# Patient Record
Sex: Male | Born: 1945
Health system: Southern US, Community
[De-identification: ages and names within clinical notes are randomized; demographics above are authoritative.]

## PROBLEM LIST (undated history)

## (undated) DIAGNOSIS — Z973 Presence of spectacles and contact lenses: Secondary | ICD-10-CM

## (undated) DIAGNOSIS — N529 Male erectile dysfunction, unspecified: Secondary | ICD-10-CM

## (undated) DIAGNOSIS — C61 Malignant neoplasm of prostate: Secondary | ICD-10-CM

## (undated) DIAGNOSIS — F524 Premature ejaculation: Secondary | ICD-10-CM

## (undated) DIAGNOSIS — M75101 Unspecified rotator cuff tear or rupture of right shoulder, not specified as traumatic: Secondary | ICD-10-CM

## (undated) DIAGNOSIS — I10 Essential (primary) hypertension: Secondary | ICD-10-CM

## (undated) DIAGNOSIS — M179 Osteoarthritis of knee, unspecified: Secondary | ICD-10-CM

## (undated) DIAGNOSIS — M1710 Unilateral primary osteoarthritis, unspecified knee: Secondary | ICD-10-CM

## (undated) DIAGNOSIS — S86019A Strain of unspecified Achilles tendon, initial encounter: Secondary | ICD-10-CM

## (undated) DIAGNOSIS — K4090 Unilateral inguinal hernia, without obstruction or gangrene, not specified as recurrent: Secondary | ICD-10-CM

## (undated) HISTORY — DX: Essential (primary) hypertension: I10

## (undated) HISTORY — DX: Unilateral inguinal hernia, without obstruction or gangrene, not specified as recurrent: K40.90

## (undated) HISTORY — PX: VASECTOMY: SHX75

## (undated) HISTORY — PX: OTHER SURGICAL HISTORY: SHX169

## (undated) HISTORY — DX: Male erectile dysfunction, unspecified: N52.9

## (undated) HISTORY — DX: Premature ejaculation: F52.4

## (undated) HISTORY — DX: Strain of unspecified achilles tendon, initial encounter: S86.019A

## (undated) HISTORY — PX: TONSILLECTOMY: SUR1361

## (undated) HISTORY — DX: Osteoarthritis of knee, unspecified: M17.9

## (undated) HISTORY — DX: Malignant neoplasm of prostate: C61

## (undated) HISTORY — DX: Unspecified rotator cuff tear or rupture of right shoulder, not specified as traumatic: M75.101

## (undated) HISTORY — PX: PROSTATE BIOPSY: SHX241

## (undated) HISTORY — DX: Unilateral primary osteoarthritis, unspecified knee: M17.10

## (undated) HISTORY — PX: ROTATOR CUFF REPAIR: SHX139

---

## 2006-10-21 ENCOUNTER — Encounter: Admission: RE | Admit: 2006-10-21 | Discharge: 2006-11-06 | Payer: Self-pay | Admitting: Family Medicine

## 2009-08-14 LAB — HM COLONOSCOPY

## 2010-02-25 HISTORY — PX: ROTATOR CUFF REPAIR: SHX139

## 2011-01-03 ENCOUNTER — Ambulatory Visit (INDEPENDENT_AMBULATORY_CARE_PROVIDER_SITE_OTHER): Payer: 59 | Admitting: Family Medicine

## 2011-01-03 ENCOUNTER — Encounter: Payer: Self-pay | Admitting: *Deleted

## 2011-01-03 ENCOUNTER — Other Ambulatory Visit: Payer: Self-pay | Admitting: Family Medicine

## 2011-01-03 ENCOUNTER — Encounter: Payer: Self-pay | Admitting: Family Medicine

## 2011-01-03 DIAGNOSIS — M109 Gout, unspecified: Secondary | ICD-10-CM | POA: Insufficient documentation

## 2011-01-03 DIAGNOSIS — N529 Male erectile dysfunction, unspecified: Secondary | ICD-10-CM

## 2011-01-03 DIAGNOSIS — I10 Essential (primary) hypertension: Secondary | ICD-10-CM | POA: Insufficient documentation

## 2011-01-03 DIAGNOSIS — Z0000 Encounter for general adult medical examination without abnormal findings: Secondary | ICD-10-CM

## 2011-01-03 DIAGNOSIS — Z23 Encounter for immunization: Secondary | ICD-10-CM

## 2011-01-03 DIAGNOSIS — Z79899 Other long term (current) drug therapy: Secondary | ICD-10-CM

## 2011-01-03 DIAGNOSIS — Z125 Encounter for screening for malignant neoplasm of prostate: Secondary | ICD-10-CM

## 2011-01-03 LAB — COMPREHENSIVE METABOLIC PANEL
ALT: 31 U/L (ref 0–53)
AST: 23 U/L (ref 0–37)
Albumin: 4.8 g/dL (ref 3.5–5.2)
Alkaline Phosphatase: 66 U/L (ref 39–117)
BUN: 18 mg/dL (ref 6–23)
CO2: 28 mEq/L (ref 19–32)
Calcium: 10.1 mg/dL (ref 8.4–10.5)
Chloride: 100 mEq/L (ref 96–112)
Creat: 0.85 mg/dL (ref 0.50–1.35)
Glucose, Bld: 99 mg/dL (ref 70–99)
Potassium: 3.9 mEq/L (ref 3.5–5.3)
Sodium: 139 mEq/L (ref 135–145)
Total Bilirubin: 0.6 mg/dL (ref 0.3–1.2)
Total Protein: 7.6 g/dL (ref 6.0–8.3)

## 2011-01-03 LAB — POCT URINALYSIS DIPSTICK
Bilirubin, UA: NEGATIVE
Blood, UA: NEGATIVE
Glucose, UA: NEGATIVE
Ketones, UA: NEGATIVE
Leukocytes, UA: NEGATIVE
Nitrite, UA: NEGATIVE
Protein, UA: NEGATIVE
Spec Grav, UA: 1.02
Urobilinogen, UA: NEGATIVE
pH, UA: 5

## 2011-01-03 LAB — LIPID PANEL
Cholesterol: 220 mg/dL — ABNORMAL HIGH (ref 0–200)
HDL: 52 mg/dL (ref >39)
LDL Cholesterol: 144 mg/dL — ABNORMAL HIGH (ref 0–99)
Total CHOL/HDL Ratio: 4.2 Ratio
Triglycerides: 119 mg/dL (ref <150)
VLDL: 24 mg/dL (ref 0–40)

## 2011-01-03 LAB — URIC ACID: Uric Acid, Serum: 6.3 mg/dL (ref 4.0–7.8)

## 2011-01-03 MED ORDER — TADALAFIL 5 MG PO TABS: 5.0000 mg | ORAL_TABLET | Freq: Every day | ORAL | Status: DC | PRN

## 2011-01-03 MED ORDER — HYDROCHLOROTHIAZIDE 25 MG PO TABS: 25.0000 mg | ORAL_TABLET | Freq: Every day | ORAL | Status: DC

## 2011-01-03 MED ORDER — ALLOPURINOL 300 MG PO TABS: 300.0000 mg | ORAL_TABLET | Freq: Every day | ORAL | Status: DC

## 2011-01-03 NOTE — Progress Notes (Signed)
Mark Caldwell is a 65 y.o. male who presents for a complete physical.  He has the following concerns: Needs medication refills. Gout--hasn't had any flares in years.  Has been on same combination of HCTZ and allopurinol without problems (used to have flares with long car rides when didn't drink enough water) HTN--Hasn't checked it recently.  When he was checking it regularly, was 130-135/80-85.     Immunization History  Administered Date(s) Administered  . Influenza Split 01/03/2011  . Tdap 02/22/2009  . Zoster 01/25/2006   Last colonoscopy: 2011, normal per patient Last PSA: 01/2010 3.18 Dentist: once yearly Ophtho: once yearly Exercise:  Admits he hasn't exercised since the Spring.  Prior to that did yoga, and was a little more active.  Past Medical History  Diagnosis Date  . Hemorrhoids   . Gout   . Right rotator cuff tear   . Hypertension     elevated BP.  Marland Kitchen Partial tear of Achilles tendon summer 2005    left  . Premature ejaculation   . ED (erectile dysfunction)     Past Surgical History  Procedure Date  . Pyloric stenosis surgery 90 weeks old.  . Tonsillectomy   . Vasectomy     History   Social History  . Marital Status: Married    Spouse Name: N/A    Number of Children: 2  . Years of Education: N/A   Occupational History  . leadership training    Social History Main Topics  . Smoking status: Former Smoker    Types: Pipe  . Smokeless tobacco: Never Used   Comment: quit smoking pipe in the 80's  . Alcohol Use: Yes     8-10 drinks per week.  . Drug Use: No  . Sexually Active: Not on file   Other Topics Concern  . Not on file   Social History Narrative   Lives with wife, stepson and dog.  Son died at 78.  78 year old stepson.  Daughter in [place], Son in [place].  Travels a lot with his job    Family History  Problem Relation Age of Onset  . Asthma Mother   . Osteoporosis Mother   . Leukemia Father   . Hyperlipidemia Father     increased chol  .  Coronary artery disease Father 19    CABG  . Cancer Father     leukemia  . Down syndrome Brother   . Deep vein thrombosis Brother 20  . Gout Brother   . Dementia Brother   . Hodgkin's lymphoma Son   . Gout Son   . Cancer Maternal Grandfather     lung (smoker)  . Diabetes Neg Hx     Current outpatient prescriptions:allopurinol (ZYLOPRIM) 300 MG tablet, Take 1 tablet (300 mg total) by mouth daily., Disp: 90 tablet, Rfl: 3;  hydrochlorothiazide (HYDRODIURIL) 25 MG tablet, Take 1 tablet (25 mg total) by mouth daily., Disp: 90 tablet, Rfl: 1;  tadalafil (CIALIS) 5 MG tablet, Take 1 tablet (5 mg total) by mouth daily as needed., Disp: 90 tablet, Rfl: 3 DISCONTD: allopurinol (ZYLOPRIM) 300 MG tablet, Take 300 mg by mouth daily.  , Disp: , Rfl: ;  DISCONTD: hydrochlorothiazide (HYDRODIURIL) 25 MG tablet, Take 25 mg by mouth daily.  , Disp: , Rfl: ;  DISCONTD: tadalafil (CIALIS) 5 MG tablet, Take 5 mg by mouth daily as needed.  , Disp: , Rfl:   No Known Allergies  ROS: The patient denies anorexia, fever, weight changes, headaches,  vision loss, decreased hearing, ear pain, hoarseness, chest pain, palpitations, dizziness, syncope, dyspnea on exertion, cough, swelling, nausea, vomiting, diarrhea, constipation, abdominal pain, melena, hematochezia, hematuria, incontinence, nocturia, weakened urine stream, dysuria, genital lesions, joint pains, numbness, tingling, weakness, tremor, suspicious skin lesions, depression, anxiety, abnormal bleeding/bruising, or enlarged lymph nodes +heartburn related to certain foods Premature ejaculation (and mild ED)--much improved on the daily Cialis  PHYSICAL EXAM: BP 130/96  Pulse 64  Ht 6' (1.829 m)  Wt 230 lb (104.327 kg)  BMI 31.19 kg/m2  General Appearance:    Alert, cooperative, no distress, appears stated age  Head:    Normocephalic, without obvious abnormality, atraumatic  Eyes:    PERRL, conjunctiva/corneas clear, EOM's intact, fundi    benign  Ears:     Normal TM's and external ear canals  Nose:   Nares normal, mucosa normal, no drainage or sinus   tenderness  Throat:   Lips, mucosa, and tongue normal; teeth and gums normal  Neck:   Supple, no lymphadenopathy;  thyroid:  no   enlargement/tenderness/nodules; no carotid   bruit or JVD  Back:    Spine nontender, no curvature, ROM normal, no CVA     tenderness  Lungs:     Clear to auscultation bilaterally without wheezes, rales or     ronchi; respirations unlabored  Chest Wall:    No tenderness or deformity   Heart:    Regular rate and rhythm, S1 and S2 normal, no murmur, rub   or gallop  Breast Exam:    No chest wall tenderness, masses or gynecomastia  Abdomen:     Soft, non-tender, nondistended, normoactive bowel sounds,    no masses, no hepatosplenomegaly. +abdominal obesity  Genitalia:    Normal male external genitalia without lesions.  Testicles without masses.  No inguinal hernias.  Rectal:    Normal sphincter tone, no masses or tenderness; guaiac negative stool.  Prostate smooth, no nodules, not enlarged.  Extremities:   No clubbing, cyanosis or edema  Pulses:   2+ and symmetric all extremities  Skin:   Skin color, texture, turgor normal, no rashes or lesions  Lymph nodes:   Cervical, supraclavicular, and axillary nodes normal  Neurologic:   CNII-XII intact, normal strength, sensation and gait; reflexes 2+ and symmetric throughout          Psych:   Normal mood, affect, hygiene and grooming.     ASSESSMENT/PLAN:  1. Routine general medical examination at a health care facility  POCT Urinalysis Dipstick, Visual acuity screening  2. Need for prophylactic vaccination and inoculation against influenza  Flu vaccine greater than or equal to 3yo preservative free IM  3. Gout  Uric acid, allopurinol (ZYLOPRIM) 300 MG tablet  4. Essential hypertension, benign  Lipid panel, Comprehensive metabolic panel, hydrochlorothiazide (HYDRODIURIL) 25 MG tablet  5. Encounter for long-term (current) use  of other medications  Comprehensive metabolic panel, Uric acid  6. Erectile dysfunction  Testosterone, free, total, tadalafil (CIALIS) 5 MG tablet  7. Special screening for malignant neoplasm of prostate  PSA   Discussed PSA screening (risks/benefits), recommended at least 30 minutes of aerobic activity at least 5 days/week; proper sunscreen use reviewed; healthy diet and alcohol recommendations (less than or equal to 2 drinks/day) reviewed; regular seatbelt use; changing batteries in smoke detectors. Self-testicular exams. Immunization recommendations discussed--flu shot given.  Colonoscopy recommendations reviewed--UTD.  HTN-- elevated today.  Discussed importance of need for daily exercise, weight loss.  Discussed portion control and healthy choices given his frequency  of eating out in restaurants.  Low sodium diet encouraged.  Start checking BP's elsewhere, and f/u if persistently >135/85.  Otherwise, f/u in 6 months with list of BP's.  Gout--ideally he shouldn't be on HCTZ, but has been on this current regimen and doing well, therefore will continue.  Discussed cutting back on alcohol (to reduce risk for gout, and to help with weight loss).

## 2011-01-03 NOTE — Patient Instructions (Addendum)
HEALTH MAINTENANCE RECOMMENDATIONS:  It is recommended that you get at least 30 minutes of aerobic exercise at least 5 days/week (for weight loss, you may need as much as 60-90 minutes). This can be any activity that gets your heart rate up. This can be divided in 10-15 minute intervals if needed, but try and build up your endurance at least once a week.  Weight bearing exercise is also recommended twice weekly.  Eat a healthy diet with lots of vegetables, fruits and fiber.  "Colorful" foods have a lot of vitamins (ie green vegetables, tomatoes, red peppers, etc).  Limit sweet tea, regular sodas and alcoholic beverages, all of which has a lot of calories and sugar.  Up to 2 alcoholic drinks daily may be beneficial for men (unless trying to lose weight, watch sugars).  Drink a lot of water.  Sunscreen of at least SPF 30 should be used on all sun-exposed parts of the skin when outside between the hours of 10 am and 4 pm (not just when at beach or pool, but even with exercise, golf, tennis, and yard work!)  Use a sunscreen that says "broad spectrum" so it covers both UVA and UVB rays, and make sure to reapply every 1-2 hours.  Remember to change the batteries in your smoke detectors when changing your clock times in the spring and fall.  Use your seat belt every time you are in a car, and please drive safely and not be distracted with cell phones and texting while driving.  Weight loss is recommended

## 2011-01-04 LAB — TESTOSTERONE, FREE, TOTAL, SHBG
Sex Hormone Binding: 36 nmol/L (ref 13–71)
Testosterone, Free: 82.1 pg/mL (ref 47.0–244.0)
Testosterone-% Free: 1.9 % (ref 1.6–2.9)
Testosterone: 422.76 ng/dL (ref 250–890)

## 2011-01-04 LAB — PSA: PSA: 4.33 ng/mL — ABNORMAL HIGH (ref <=4.00)

## 2011-01-10 ENCOUNTER — Telehealth: Payer: Self-pay | Admitting: *Deleted

## 2011-01-10 NOTE — Telephone Encounter (Signed)
Called patient and left him a message informing him that his appt with Dr.Dahlstedt @ [facility] 01/30/2011 @ 2pm. I will fax notes and labs to [phone]. Patient to call if he has any further questions.  FYI-They had originally offered me an appt with Dr.Dahlstedt(whom pt had seen before and they would not allow him to see another provider) for Jan 2013 and I sent a request to his nurse, so this appt in Dec is the absolute only appt they could possibly squeeze him in for.

## 2011-03-21 ENCOUNTER — Encounter: Payer: Self-pay | Admitting: *Deleted

## 2011-03-21 DIAGNOSIS — Z029 Encounter for administrative examinations, unspecified: Secondary | ICD-10-CM

## 2011-06-28 ENCOUNTER — Encounter: Payer: Self-pay | Admitting: Urology

## 2011-07-08 ENCOUNTER — Ambulatory Visit (INDEPENDENT_AMBULATORY_CARE_PROVIDER_SITE_OTHER): Payer: 59 | Admitting: Family Medicine

## 2011-07-08 ENCOUNTER — Encounter: Payer: Self-pay | Admitting: Family Medicine

## 2011-07-08 VITALS — BP 130/84 | HR 68 | Ht 72.0 in | Wt 222.0 lb

## 2011-07-08 DIAGNOSIS — I10 Essential (primary) hypertension: Secondary | ICD-10-CM

## 2011-07-08 DIAGNOSIS — Z79899 Other long term (current) drug therapy: Secondary | ICD-10-CM

## 2011-07-08 DIAGNOSIS — R5381 Other malaise: Secondary | ICD-10-CM

## 2011-07-08 DIAGNOSIS — M109 Gout, unspecified: Secondary | ICD-10-CM

## 2011-07-08 DIAGNOSIS — G4700 Insomnia, unspecified: Secondary | ICD-10-CM

## 2011-07-08 DIAGNOSIS — E7800 Pure hypercholesterolemia, unspecified: Secondary | ICD-10-CM

## 2011-07-08 DIAGNOSIS — R5383 Other fatigue: Secondary | ICD-10-CM

## 2011-07-08 DIAGNOSIS — N529 Male erectile dysfunction, unspecified: Secondary | ICD-10-CM

## 2011-07-08 MED ORDER — SILDENAFIL CITRATE 100 MG PO TABS: 50.0000 mg | ORAL_TABLET | Freq: Every day | ORAL | Status: DC | PRN

## 2011-07-08 MED ORDER — HYDROCHLOROTHIAZIDE 25 MG PO TABS: 25.0000 mg | ORAL_TABLET | Freq: Every day | ORAL | Status: DC

## 2011-07-08 MED ORDER — ZOLPIDEM TARTRATE 10 MG PO TABS: 10.0000 mg | ORAL_TABLET | Freq: Every evening | ORAL | Status: DC | PRN

## 2011-07-08 NOTE — Progress Notes (Signed)
Chief Complaint  Patient presents with  . Hypertension    6 month follow up. Also had a PSA biopsy-negative results, Dr.Dahlstedt. Also wants to discuss weight issues.    HPI:  Patient presents for 6 month follow up on hypertension.  BP's over the last month have been running low-mid 130's/low 80's.  Prior to this, BP's were a little higher, ranging 130-145/80-90.  Has lost 8 pounds since the last visit--does Weight Watchers when not traveling.  Exercising twice a week.  He is still traveling at least 3 days/week, and eats out in restaurants, doesn't exercise during travel times.  Review of his records of weight and BP show that he lost the weight down to 223 within the first 2 weeks after last visit, but has fluctuated between 220-223 since then.  Patient was wanting PSA repeated today, thinking that it should have gotten lower, and was surprised when it was still elevated through labs done for insurance (4.76).  He saw urologist in December, and had benign biopsy in January. He was told to f/u in 1 year.  He brings in labs from insurance, done in March 2013.  This showed NONfasting glu 134, A1c 5.5.  Lipids were improved--total 195, HDL 54.4, LDL 109.4, ratio 3.54, TG 161.  Uric acid 6.6, remainder of chem panel was also normal.  ED--he would like to go back to Viagra. He recalls trying it in the past and found it as effective  He will be traveling to [place], and is requesting Rx for Ambien #20, as he has used this in the past with travel to [place].  Past Medical History  Diagnosis Date  . Hemorrhoids   . Gout   . Right rotator cuff tear   . Hypertension     elevated BP.  Marland Kitchen Partial tear of Achilles tendon summer 2005    left  . Premature ejaculation   . ED (erectile dysfunction)     Past Surgical History  Procedure Date  . Pyloric stenosis surgery 35 weeks old.  . Tonsillectomy   . Vasectomy     History   Social History  . Marital Status: Married    Spouse Name: N/A   Number of Children: 2  . Years of Education: N/A   Occupational History  . leadership training    Social History Main Topics  . Smoking status: Former Smoker    Types: Pipe  . Smokeless tobacco: Never Used   Comment: quit smoking pipe in the 80's  . Alcohol Use: Yes     8-10 drinks per week.  . Drug Use: No  . Sexually Active: Not on file   Other Topics Concern  . Not on file   Social History Narrative   Lives with wife, stepson and dog.  Son died at 70.  42 year old stepson.  Daughter in [place], Son in [place].  Travels a lot with his job    Family History  Problem Relation Age of Onset  . Asthma Mother   . Osteoporosis Mother   . Leukemia Father   . Hyperlipidemia Father     increased chol  . Coronary artery disease Father 62    CABG  . Cancer Father     leukemia  . Down syndrome Brother   . Deep vein thrombosis Brother 20  . Gout Brother   . Dementia Brother   . Hodgkin's lymphoma Son   . Gout Son   . Cancer Maternal Grandfather     lung (smoker)  .  Diabetes Neg Hx    Current Outpatient Prescriptions on File Prior to Visit  Medication Sig Dispense Refill  . allopurinol (ZYLOPRIM) 300 MG tablet Take 1 tablet (300 mg total) by mouth daily.  90 tablet  3  . tadalafil (CIALIS) 5 MG tablet Take 1 tablet (5 mg total) by mouth daily as needed.  90 tablet  3  . DISCONTD: hydrochlorothiazide (HYDRODIURIL) 25 MG tablet Take 1 tablet (25 mg total) by mouth daily.  90 tablet  1  . sildenafil (VIAGRA) 100 MG tablet Take 0.5-1 tablets (50-100 mg total) by mouth daily as needed for erectile dysfunction.  10 tablet  11  . zolpidem (AMBIEN) 10 MG tablet Take 1 tablet (10 mg total) by mouth at bedtime as needed for sleep.  20 tablet  1    No Known Allergies  ROS:  Denies headaches, chest pain, palpitations, shortness of breath, dizziness, edema, GI complaints. Denies joint pains, gout.  Concerned about lesion on back--denies pain, but sometimes bothers him thinks he wants it  removed.  Denies any recent change  PHYSICAL EXAM: BP 130/84  Pulse 68  Ht 6' (1.829 m)  Wt 222 lb (100.699 kg)  BMI 30.11 kg/m2 Well developed, overweight, pleasant male in no distress Back: subcutaneous, nontender lesion measuring just under 2cm in height and width.  No central pore, but has consistency of sebaceous cyst (vs lipoma). No CVA tenderness or spine tenderness Neck: no lymphadenopathy or mass Heart: regular rate and rhythm without murmur Lungs: clear bilaterally Extremities: no edema Skin: no rashes/lesions Psych: normal mood, affect, hygiene and grooming  ASSESSMENT/PLAN: 1. Essential hypertension, benign  hydrochlorothiazide (HYDRODIURIL) 25 MG tablet, Comprehensive metabolic panel  2. ED (erectile dysfunction)  sildenafil (VIAGRA) 100 MG tablet  3. Insomnia  zolpidem (AMBIEN) 10 MG tablet  4. Pure hypercholesterolemia  Lipid panel  5. Other malaise and fatigue  CBC with Differential, TSH  6. Gout  Uric acid  7. Encounter for long-term (current) use of other medications  Comprehensive metabolic panel, Uric acid   6 months f/u--fasting labs prior FLP, c-met, uric acid, cbc, TSH  Urologist to follow the PSA. Reassured regarding the fact that PSA will likely remain elevated in the 4-5 range.  Recheck as scheduled with urologist.  Strongly encouraged more frequent exercise, portion control while traveling.  Weight loss encouraged.  Lipids improved from last check here, per insurance labs.  Continue low cholesterol diet and recheck in 6 months.  HTN--controlled, somewhat borderline.  Increased exercise, and further weight loss will help keep numbers within goal.  Gout--asymptomatic--prefers to remain on HCTZ and allopurinol rather than making any changes to the regimen.

## 2011-07-08 NOTE — Patient Instructions (Signed)
Try and get at least 30 minutes of exercise daily.  Continue the low cholesterol diet--your numbers were much improved. Try and watch your portions, as well as healthy food choices while out of town.  Continue weight loss efforts.

## 2011-12-03 ENCOUNTER — Telehealth: Payer: Self-pay | Admitting: Family Medicine

## 2011-12-03 DIAGNOSIS — N529 Male erectile dysfunction, unspecified: Secondary | ICD-10-CM

## 2011-12-03 MED ORDER — SILDENAFIL CITRATE 100 MG PO TABS: 50.0000 mg | ORAL_TABLET | Freq: Every day | ORAL | Status: DC | PRN

## 2011-12-03 NOTE — Telephone Encounter (Signed)
done

## 2011-12-30 ENCOUNTER — Other Ambulatory Visit: Payer: 59

## 2011-12-30 DIAGNOSIS — Z79899 Other long term (current) drug therapy: Secondary | ICD-10-CM

## 2011-12-30 DIAGNOSIS — I10 Essential (primary) hypertension: Secondary | ICD-10-CM

## 2011-12-30 DIAGNOSIS — R5381 Other malaise: Secondary | ICD-10-CM

## 2011-12-30 DIAGNOSIS — R5383 Other fatigue: Secondary | ICD-10-CM

## 2011-12-30 DIAGNOSIS — E7800 Pure hypercholesterolemia, unspecified: Secondary | ICD-10-CM

## 2011-12-31 LAB — LIPID PANEL
Cholesterol: 191 mg/dL (ref 0–200)
HDL: 56 mg/dL (ref >39)
LDL Cholesterol: 117 mg/dL — ABNORMAL HIGH (ref 0–99)
Total CHOL/HDL Ratio: 3.4 Ratio
Triglycerides: 89 mg/dL (ref <150)
VLDL: 18 mg/dL (ref 0–40)

## 2011-12-31 LAB — CBC WITH DIFFERENTIAL/PLATELET
Basophils Absolute: 0.1 10*3/uL (ref 0.0–0.1)
Basophils Relative: 1 % (ref 0–1)
Eosinophils Absolute: 0.2 10*3/uL (ref 0.0–0.7)
Eosinophils Relative: 3 % (ref 0–5)
HCT: 45.3 % (ref 39.0–52.0)
Hemoglobin: 15.3 g/dL (ref 13.0–17.0)
Lymphocytes Relative: 30 % (ref 12–46)
Lymphs Abs: 2.1 10*3/uL (ref 0.7–4.0)
MCH: 30.3 pg (ref 26.0–34.0)
MCHC: 33.8 g/dL (ref 30.0–36.0)
MCV: 89.7 fL (ref 78.0–100.0)
Monocytes Absolute: 0.5 10*3/uL (ref 0.1–1.0)
Monocytes Relative: 7 % (ref 3–12)
Neutro Abs: 4.1 10*3/uL (ref 1.7–7.7)
Neutrophils Relative %: 59 % (ref 43–77)
Platelets: 246 10*3/uL (ref 150–400)
RBC: 5.05 MIL/uL (ref 4.22–5.81)
RDW: 14.7 % (ref 11.5–15.5)
WBC: 6.9 10*3/uL (ref 4.0–10.5)

## 2011-12-31 LAB — COMPREHENSIVE METABOLIC PANEL
ALT: 22 U/L (ref 0–53)
AST: 19 U/L (ref 0–37)
Albumin: 4.5 g/dL (ref 3.5–5.2)
Alkaline Phosphatase: 62 U/L (ref 39–117)
BUN: 24 mg/dL — ABNORMAL HIGH (ref 6–23)
CO2: 26 mEq/L (ref 19–32)
Calcium: 9.7 mg/dL (ref 8.4–10.5)
Chloride: 101 mEq/L (ref 96–112)
Creat: 0.84 mg/dL (ref 0.50–1.35)
Glucose, Bld: 89 mg/dL (ref 70–99)
Potassium: 4.6 mEq/L (ref 3.5–5.3)
Sodium: 138 mEq/L (ref 135–145)
Total Bilirubin: 0.8 mg/dL (ref 0.3–1.2)
Total Protein: 6.9 g/dL (ref 6.0–8.3)

## 2011-12-31 LAB — TSH: TSH: 1.25 u[IU]/mL (ref 0.350–4.500)

## 2011-12-31 LAB — URIC ACID: Uric Acid, Serum: 7.5 mg/dL (ref 4.0–7.8)

## 2012-01-06 ENCOUNTER — Ambulatory Visit (INDEPENDENT_AMBULATORY_CARE_PROVIDER_SITE_OTHER): Payer: 59 | Admitting: Family Medicine

## 2012-01-06 ENCOUNTER — Encounter: Payer: Self-pay | Admitting: Family Medicine

## 2012-01-06 VITALS — BP 140/90 | HR 80 | Ht 72.0 in | Wt 227.0 lb

## 2012-01-06 DIAGNOSIS — L909 Atrophic disorder of skin, unspecified: Secondary | ICD-10-CM

## 2012-01-06 DIAGNOSIS — I10 Essential (primary) hypertension: Secondary | ICD-10-CM

## 2012-01-06 DIAGNOSIS — L918 Other hypertrophic disorders of the skin: Secondary | ICD-10-CM

## 2012-01-06 DIAGNOSIS — L919 Hypertrophic disorder of the skin, unspecified: Secondary | ICD-10-CM

## 2012-01-06 DIAGNOSIS — M109 Gout, unspecified: Secondary | ICD-10-CM

## 2012-01-06 DIAGNOSIS — Z23 Encounter for immunization: Secondary | ICD-10-CM

## 2012-01-06 DIAGNOSIS — R972 Elevated prostate specific antigen [PSA]: Secondary | ICD-10-CM

## 2012-01-06 DIAGNOSIS — Z125 Encounter for screening for malignant neoplasm of prostate: Secondary | ICD-10-CM

## 2012-01-06 DIAGNOSIS — R5383 Other fatigue: Secondary | ICD-10-CM

## 2012-01-06 DIAGNOSIS — R5381 Other malaise: Secondary | ICD-10-CM

## 2012-01-06 DIAGNOSIS — N529 Male erectile dysfunction, unspecified: Secondary | ICD-10-CM

## 2012-01-06 DIAGNOSIS — Z0000 Encounter for general adult medical examination without abnormal findings: Secondary | ICD-10-CM

## 2012-01-06 LAB — PSA, TOTAL AND FREE
PSA, Free Pct: 16 % — ABNORMAL LOW (ref >25)
PSA, Free: 0.83 ng/mL
PSA: 5.31 ng/mL — ABNORMAL HIGH (ref <=4.00)

## 2012-01-06 MED ORDER — SILDENAFIL CITRATE 100 MG PO TABS: 50.0000 mg | ORAL_TABLET | Freq: Every day | ORAL | Status: DC | PRN

## 2012-01-06 MED ORDER — ALLOPURINOL 300 MG PO TABS: 300.0000 mg | ORAL_TABLET | Freq: Every day | ORAL | Status: DC

## 2012-01-06 MED ORDER — LISINOPRIL-HYDROCHLOROTHIAZIDE 20-25 MG PO TABS: 1.0000 | ORAL_TABLET | Freq: Every day | ORAL | Status: DC

## 2012-01-06 NOTE — Progress Notes (Signed)
Chief Complaint  Patient presents with  . Annual Exam    annual CPE, needs form filled out. Labs done 12/30/11.   Mark Caldwell is a 66 y.o. male who presents for a complete physical.  He has the following concerns: He is here for med check and needs refills.. Gout--no flares in the last year. Compliant with his medication. HTN--121-151/72-91.  Averaging 140/80's, mostly 135-150.  Denies headaches, dizziness, edema, chest pain. ED-- using Viagra prn with good results. Skin tags on L neck he wants frozen again Freckle on chest his wife is concerned about.  Denies change in size, color, or any bleeding.  Health Maintenance: Immunization History  Administered Date(s) Administered  . Hepatitis A 01/27/2006  . Influenza Split 01/10/2009, 01/03/2011  . Tdap 02/22/2009  . Zoster 01/25/2006   Last colonoscopy: 2011, normal per patient Last PSA: 1 year ago, due to see Dr. Retta Diones again. Had biopsy last year, benign. Dentist: yearly Ophtho: yearly Exercise: 30 minutes twice a week.  Walks a lot with his traveling.  Past Medical History  Diagnosis Date  . Hemorrhoids   . Gout   . Right rotator cuff tear   . Hypertension     elevated BP.  Marland Kitchen Partial tear of Achilles tendon summer 2005    left  . Premature ejaculation   . ED (erectile dysfunction)     Past Surgical History  Procedure Date  . Pyloric stenosis surgery 36 weeks old.  . Tonsillectomy   . Vasectomy   . Rotator cuff repair     right; Dr. Jerl Santos    History   Social History  . Marital Status: Married    Spouse Name: N/A    Number of Children: 2  . Years of Education: N/A   Occupational History  . leadership training    Social History Main Topics  . Smoking status: Former Smoker    Types: Pipe  . Smokeless tobacco: Never Used     Comment: quit smoking pipe in the 80's  . Alcohol Use: Yes     Comment: 8-10 drinks per week.  . Drug Use: No  . Sexually Active: Yes -- Male partner(s)   Other Topics  Concern  . Not on file   Social History Narrative   Lives with wife, stepson and dog.  Son died at 103.  48 year old stepson.  Daughter in [place], Son in [place].  Travels a lot with his job    Family History  Problem Relation Age of Onset  . Asthma Mother   . Osteoporosis Mother   . Leukemia Father   . Hyperlipidemia Father     increased chol  . Coronary artery disease Father 68    CABG  . Cancer Father     leukemia  . Down syndrome Brother   . Deep vein thrombosis Brother 20  . Gout Brother   . Dementia Brother   . Hodgkin's lymphoma Son   . Gout Son   . Cancer Maternal Grandfather     lung (smoker)  . Diabetes Neg Hx    Current Outpatient Prescriptions on File Prior to Visit  Medication Sig Dispense Refill  . [DISCONTINUED] allopurinol (ZYLOPRIM) 300 MG tablet Take 1 tablet (300 mg total) by mouth daily.  90 tablet  3  . [DISCONTINUED] sildenafil (VIAGRA) 100 MG tablet Take 0.5-1 tablets (50-100 mg total) by mouth daily as needed for erectile dysfunction.  10 tablet  11  . lisinopril-hydrochlorothiazide (PRINZIDE,ZESTORETIC) 20-25 MG per tablet Take 1  tablet by mouth daily.  90 tablet  0  . zolpidem (AMBIEN) 10 MG tablet Take 1 tablet (10 mg total) by mouth at bedtime as needed for sleep.  20 tablet  1   (prior to visit, just on HCTZ, changed to lisinopril HCT today)  No Known Allergies  ROS: The patient denies anorexia, fever, headaches, vision loss, decreased hearing, ear pain, hoarseness, chest pain, palpitations, dizziness, syncope, dyspnea on exertion, cough, swelling, nausea, vomiting, diarrhea, constipation, abdominal pain, melena, hematochezia, hematuria, incontinence, nocturia, weakened urine stream, dysuria, genital lesions, joint pains, numbness, tingling, weakness, tremor, depression, anxiety, abnormal bleeding/bruising, or enlarged lymph nodes  Premature ejaculation (and mild ED)-improved/controlled with Viagra.  Skin concerns per HPI.  PHYSICAL EXAM: BP 140/90   Pulse 80  Ht 6' (1.829 m)  Wt 227 lb (102.967 kg)  BMI 30.79 kg/m2  General Appearance:  Alert, cooperative, no distress, appears stated age   Head:  Normocephalic, without obvious abnormality, atraumatic   Eyes:  PERRL, conjunctiva/corneas clear, EOM's intact, fundi  benign   Ears:  Normal TM's and external ear canals   Nose:  Nares normal, mucosa normal, no drainage or sinus tenderness   Throat:  Lips, mucosa, and tongue normal; teeth and gums normal   Neck:  Supple, no lymphadenopathy; thyroid: no enlargement/tenderness/nodules; no carotid  bruit or JVD   Back:  Spine nontender, no curvature, ROM normal, no CVA tenderness   Lungs:  Clear to auscultation bilaterally without wheezes, rales or ronchi; respirations unlabored   Chest Wall:  No tenderness or deformity   Heart:  Regular rate and rhythm, S1 and S2 normal, no murmur, rub  or gallop   Breast Exam:  No chest wall tenderness, masses or gynecomastia   Abdomen:  Soft, non-tender, nondistended, normoactive bowel sounds,  no masses, no hepatosplenomegaly. +abdominal obesity   Genitalia:  Deferred to urology  Rectal:  Deferred to urology   Extremities:  No clubbing, cyanosis or edema   Pulses:  2+ and symmetric all extremities   Skin:  Skin color, texture, turgor normal, no rashes or lesions. Skin tag (small) L neck. Nontender.  Angiomas on chest/abdomen.  The one his wife is concerned about is purple (rather than red)--vascular, 1mm, round, uniform  Lymph nodes:  Cervical, supraclavicular, and axillary nodes normal   Neurologic:  CNII-XII intact, normal strength, sensation and gait; reflexes 2+ and symmetric throughout   Psych: Normal mood, affect, hygiene and grooming.   Lab Results  Component Value Date   WBC 6.9 12/30/2011   HGB 15.3 12/30/2011   HCT 45.3 12/30/2011   MCV 89.7 12/30/2011   PLT 246 12/30/2011   Lab Results  Component Value Date   CHOL 191 12/30/2011   CHOL 220* 01/03/2011   Lab Results  Component Value  Date   HDL 56 12/30/2011   HDL 52 42/06/9561   Lab Results  Component Value Date   LDLCALC 117* 12/30/2011   LDLCALC 144* 01/03/2011   Lab Results  Component Value Date   TRIG 89 12/30/2011   TRIG 119 01/03/2011   Lab Results  Component Value Date   CHOLHDL 3.4 12/30/2011   CHOLHDL 4.2 01/03/2011   No results found for this basename: LDLDIRECT     Chemistry      Component Value Date/Time   NA 138 12/30/2011 0841   K 4.6 12/30/2011 0841   CL 101 12/30/2011 0841   CO2 26 12/30/2011 0841   BUN 24* 12/30/2011 0841   CREATININE 0.84 12/30/2011 0841  Component Value Date/Time   CALCIUM 9.7 12/30/2011 0841   ALKPHOS 62 12/30/2011 0841   AST 19 12/30/2011 0841   ALT 22 12/30/2011 0841   BILITOT 0.8 12/30/2011 0841     Glucose 89 Lab Results  Component Value Date   TSH 1.250 12/30/2011   Uric acid 7.5   ASSESSMENT/PLAN: 1. Routine general medical examination at a health care facility    2. Gout  allopurinol (ZYLOPRIM) 300 MG tablet  3. Essential hypertension, benign  lisinopril-hydrochlorothiazide (PRINZIDE,ZESTORETIC) 20-25 MG per tablet   suboptimally controlled on HCTZ  4. ED (erectile dysfunction)  sildenafil (VIAGRA) 100 MG tablet, Testosterone  5. Need for prophylactic vaccination and inoculation against influenza  Flu vaccine greater than or equal to 3yo preservative free IM  6. Special screening for malignant neoplasm of prostate  PSA (Reflex To Free) (Serial)  7. Abnormal PSA  PSA (Reflex To Free) (Serial), PSA, total and free   repeat today, per pt request, and forward results to Dr. Retta Diones.  He will call to schedule f/u appt.  8. Fatigue  Testosterone  9. Skin tag  PR DESTRUC BENIGN/PREMAL,FIRST LESION   HTN--change to Lisinopril HCT 20-25.  Discussed the need for daily exercise, weight loss, low sodium diet. Monitor BP and keep record.  Reviewed risks/side effects of meds Return 3-6 weeks with list of BP's and expect labs (b-met at visit).  H/o abnormal PSA, with  benign biopsy.  Due for f/u with urology.  Labs done today, per pt request, and will forward to Dr. Retta Diones.  He is also requesting to have testosterone checked again (normal last year).  Gout--uric acid higher than at last check, but remains asymptomatic on his current regimen.  Continue allopurinol.  Briefly reviewed low purine diet.  Recommended decreasing alcohol intake.  Skin tag--treated with cryotherapy in freeze-thaw-freeze technique.  Discussed wound care.  Pt requested treatment of this lesions (aware that it is benign--gets irritated by his briefcase).  Reviewed ABCD's of melanoma.  Lesions on chest appears vascular, reassured, continued monitoring.

## 2012-01-06 NOTE — Patient Instructions (Signed)
HEALTH MAINTENANCE RECOMMENDATIONS:  It is recommended that you get at least 30 minutes of aerobic exercise at least 5 days/week (for weight loss, you may need as much as 60-90 minutes). This can be any activity that gets your heart rate up. This can be divided in 10-15 minute intervals if needed, but try and build up your endurance at least once a week.  Weight bearing exercise is also recommended twice weekly.  Eat a healthy diet with lots of vegetables, fruits and fiber.  "Colorful" foods have a lot of vitamins (ie green vegetables, tomatoes, red peppers, etc).  Limit sweet tea, regular sodas and alcoholic beverages, all of which has a lot of calories and sugar.  Up to 2 alcoholic drinks daily may be beneficial for men (unless trying to lose weight, watch sugars).  Drink a lot of water.  Sunscreen of at least SPF 30 should be used on all sun-exposed parts of the skin when outside between the hours of 10 am and 4 pm (not just when at beach or pool, but even with exercise, golf, tennis, and yard work!)  Use a sunscreen that says "broad spectrum" so it covers both UVA and UVB rays, and make sure to reapply every 1-2 hours.  Remember to change the batteries in your smoke detectors when changing your clock times in the spring and fall.  Use your seat belt every time you are in a car, and please drive safely and not be distracted with cell phones and texting while driving.   Lab Results  Component Value Date   WBC 6.9 12/30/2011   HGB 15.3 12/30/2011   HCT 45.3 12/30/2011   MCV 89.7 12/30/2011   PLT 246 12/30/2011   Lab Results  Component Value Date   CHOL 191 12/30/2011   CHOL 220* 01/03/2011   Lab Results  Component Value Date   HDL 56 12/30/2011   HDL 52 16/02/958   Lab Results  Component Value Date   LDLCALC 117* 12/30/2011   LDLCALC 144* 01/03/2011   Lab Results  Component Value Date   TRIG 89 12/30/2011   TRIG 119 01/03/2011   Lab Results  Component Value Date   CHOLHDL 3.4  12/30/2011   CHOLHDL 4.2 01/03/2011   No results found for this basename: LDLDIRECT     Chemistry      Component Value Date/Time   NA 138 12/30/2011 0841   K 4.6 12/30/2011 0841   CL 101 12/30/2011 0841   CO2 26 12/30/2011 0841   BUN 24* 12/30/2011 0841   CREATININE 0.84 12/30/2011 0841      Component Value Date/Time   CALCIUM 9.7 12/30/2011 0841   ALKPHOS 62 12/30/2011 0841   AST 19 12/30/2011 0841   ALT 22 12/30/2011 0841   BILITOT 0.8 12/30/2011 0841     Glucose 89 Lab Results  Component Value Date   TSH 1.250 12/30/2011   Uric acid 7.5

## 2012-01-07 LAB — TESTOSTERONE: Testosterone: 422.56 ng/dL (ref 300–890)

## 2012-02-03 ENCOUNTER — Ambulatory Visit (INDEPENDENT_AMBULATORY_CARE_PROVIDER_SITE_OTHER): Payer: 59 | Admitting: Family Medicine

## 2012-02-03 ENCOUNTER — Encounter: Payer: Self-pay | Admitting: Family Medicine

## 2012-02-03 VITALS — BP 142/82 | HR 68 | Ht 72.0 in | Wt 228.0 lb

## 2012-02-03 DIAGNOSIS — Z79899 Other long term (current) drug therapy: Secondary | ICD-10-CM

## 2012-02-03 DIAGNOSIS — I10 Essential (primary) hypertension: Secondary | ICD-10-CM

## 2012-02-03 LAB — BASIC METABOLIC PANEL
BUN: 20 mg/dL (ref 6–23)
CO2: 25 mEq/L (ref 19–32)
Calcium: 10 mg/dL (ref 8.4–10.5)
Chloride: 98 mEq/L (ref 96–112)
Creat: 0.89 mg/dL (ref 0.50–1.35)
Glucose, Bld: 101 mg/dL — ABNORMAL HIGH (ref 70–99)
Potassium: 4.3 mEq/L (ref 3.5–5.3)
Sodium: 133 mEq/L — ABNORMAL LOW (ref 135–145)

## 2012-02-03 NOTE — Progress Notes (Signed)
Chief Complaint  Patient presents with  . Hypertension    follow up on hypertension. Has biopsy scheduled Jan 6th with urologist.    BP med was changed from HCTZ to lisinopril HCT at last visit, due to BP's being above goal.  Denies any cough or side effects.  BP's are improved, but still not perfect, which concerns him. Ranging from 115-152/68-84.  Many in the 115-135 range, just a few higher, in just a few days (multiple checks on same day that were high).  Has been exercising more.  Continues to travel a lot. No changes in his weight.  Saw Dr. Retta Diones and is set to have a prostate ultrasound and biopsy in January.  Past Medical History  Diagnosis Date  . Hemorrhoids   . Gout   . Right rotator cuff tear   . Hypertension     elevated BP.  Marland Kitchen Partial tear of Achilles tendon summer 2005    left  . Premature ejaculation   . ED (erectile dysfunction)    Past Surgical History  Procedure Date  . Pyloric stenosis surgery 85 weeks old.  . Tonsillectomy   . Vasectomy   . Rotator cuff repair     right; Dr. Jerl Santos   History   Social History  . Marital Status: Married    Spouse Name: N/A    Number of Children: 2  . Years of Education: N/A   Occupational History  . leadership training    Social History Main Topics  . Smoking status: Former Smoker    Types: Pipe  . Smokeless tobacco: Never Used     Comment: quit smoking pipe in the 80's  . Alcohol Use: Yes     Comment: 8-10 drinks per week.  . Drug Use: No  . Sexually Active: Yes -- Male partner(s)   Other Topics Concern  . Not on file   Social History Narrative   Lives with wife, stepson and dog.  Son died at 21.  44 year old stepson.  Daughter in [place], Son in [place].  Travels a lot with his job   ROS:  Denies fevers, headaches, dizziness, chest pain, edema, muscle cramps.  Denies urinary complaints, joint pains, gout. Moods good  PHYSICAL EXAM: BP 150/86  Pulse 68  Ht 6' (1.829 m)  Wt 228 lb (103.42 kg)  BMI  30.92 kg/m2  142/82 on repeat by MD, RA Heart: regular rate and rhythm Lungs: clear bilaterally Extremities: no edema Psych: normal mood, affect  ASSESSMENT/PLAN: 1. Essential hypertension, benign  Basic metabolic panel  2. Encounter for long-term (current) use of other medications  Basic metabolic panel   HTN--improved control.  Not "perfect" but improved.  Continue current medications, but continue to work on behavioral measures--exercise, weight loss, low sodium diet.  Continue to periodically check BP's. F/u 6 months, sooner if BP's high (>140/90)

## 2012-02-03 NOTE — Patient Instructions (Addendum)
Continue current meds.  Have pharmacy contact us for refills when needed. Return sooner than 6 months if BP's are consistently running >140/90  Continue low sodium diet, daily exercise, and attempts at weight loss (just try NOT to gain over the holidays, worry about loss later).

## 2012-03-06 ENCOUNTER — Encounter: Payer: Self-pay | Admitting: Family Medicine

## 2012-03-27 ENCOUNTER — Other Ambulatory Visit: Payer: Self-pay | Admitting: Family Medicine

## 2012-04-13 ENCOUNTER — Telehealth: Payer: Self-pay | Admitting: *Deleted

## 2012-04-13 NOTE — Telephone Encounter (Signed)
noted 

## 2012-04-13 NOTE — Telephone Encounter (Signed)
Patient called to give you a report after seeing Dr.Dahlstedt [id number] cancer. Short term recommendation is to do active surveillance, PSA and rectal exam every 4 months and biopsy in 18-24 months. He also sent off a biopsy for specialized analysis of his Gleason score which was 6-to see if prostate cancer cells are at the high end of 6 or the low end. He thanked you for your concern and also wanted you to know that he felt that Dr.Dahlstedt was particularly helpful as the office was closed and he still saw him anyway.

## 2012-04-30 ENCOUNTER — Encounter: Payer: Self-pay | Admitting: Family Medicine

## 2012-06-11 ENCOUNTER — Telehealth: Payer: Self-pay | Admitting: Internal Medicine

## 2012-06-11 DIAGNOSIS — Z0289 Encounter for other administrative examinations: Secondary | ICD-10-CM

## 2012-06-11 NOTE — Telephone Encounter (Signed)
Faxed medical records to [facility] @ [phone]

## 2012-08-10 ENCOUNTER — Encounter: Payer: 59 | Admitting: Family Medicine

## 2012-08-21 ENCOUNTER — Other Ambulatory Visit: Payer: Self-pay | Admitting: Family Medicine

## 2012-10-12 ENCOUNTER — Encounter: Payer: Self-pay | Admitting: Family Medicine

## 2012-10-12 ENCOUNTER — Ambulatory Visit (INDEPENDENT_AMBULATORY_CARE_PROVIDER_SITE_OTHER): Payer: 59 | Admitting: Family Medicine

## 2012-10-12 VITALS — BP 130/88 | HR 64 | Ht 72.5 in | Wt 226.0 lb

## 2012-10-12 DIAGNOSIS — E7800 Pure hypercholesterolemia, unspecified: Secondary | ICD-10-CM

## 2012-10-12 DIAGNOSIS — C61 Malignant neoplasm of prostate: Secondary | ICD-10-CM | POA: Insufficient documentation

## 2012-10-12 DIAGNOSIS — Z79899 Other long term (current) drug therapy: Secondary | ICD-10-CM

## 2012-10-12 DIAGNOSIS — M109 Gout, unspecified: Secondary | ICD-10-CM

## 2012-10-12 DIAGNOSIS — I10 Essential (primary) hypertension: Secondary | ICD-10-CM

## 2012-10-12 NOTE — Progress Notes (Signed)
Chief Complaint  Patient presents with  . Hypertension    nonfasting med check, did bring bp log.   Hypertension follow-up:  Blood pressures elsewhere are 107-142/59-86, mostly running 116-135/70's-80. They are overall improved from his last visit.  He denies dizziness, headaches, chest pain.  Denies side effects of medications.  Goes to [place] every week.  He "eats and plays well", but exercise is limited other than walking a lot in [place] and being "active" at home. He carries a heavy briefcase and does a lot of walking but no other regular exercise.  He recently underwent repeat prostate biopsies.  It again only showed 1 foci with cancer out of many biopsies.  Active surveillance at this time for treatment, under the care of Dr. Retta Diones.  Past Medical History  Diagnosis Date  . Hemorrhoids   . Gout   . Right rotator cuff tear   . Hypertension     elevated BP.  Marland Kitchen Partial tear of Achilles tendon summer 2005    left  . Premature ejaculation   . ED (erectile dysfunction)   . Prostate cancer     surveillance; Dr. Retta Diones   Past Surgical History  Procedure Laterality Date  . Pyloric stenosis surgery  80 weeks old.  . Tonsillectomy    . Vasectomy    . Rotator cuff repair      right; Dr. Jerl Santos   History   Social History  . Marital Status: Married    Spouse Name: N/A    Number of Children: 2  . Years of Education: N/A   Occupational History  . leadership training    Social History Main Topics  . Smoking status: Former Smoker    Types: Pipe  . Smokeless tobacco: Never Used     Comment: quit smoking pipe in the 80's  . Alcohol Use: Yes     Comment: 8-10 drinks per week.  . Drug Use: No  . Sexual Activity: Yes    Partners: Female   Other Topics Concern  . Not on file   Social History Narrative   Lives with wife, stepson and dog.  Son died at 8.  7 year old stepson.  Daughter in [place], Son in [place].  Travels a lot with his job   Current outpatient  prescriptions:allopurinol (ZYLOPRIM) 300 MG tablet, Take 1 tablet (300 mg total) by mouth daily., Disp: 90 tablet, Rfl: 3;  lisinopril-hydrochlorothiazide (PRINZIDE,ZESTORETIC) 20-25 MG per tablet, take 1 tablet by mouth once daily, Disp: 30 tablet, Rfl: 4;  sildenafil (VIAGRA) 100 MG tablet, Take 0.5-1 tablets (50-100 mg total) by mouth daily as needed for erectile dysfunction., Disp: 10 tablet, Rfl: 11 tamsulosin (FLOMAX) 0.4 MG CAPS capsule, Take 0.4 mg by mouth daily., Disp: , Rfl: ;  zolpidem (AMBIEN) 10 MG tablet, Take 1 tablet (10 mg total) by mouth at bedtime as needed for sleep., Disp: 20 tablet, Rfl: 1  No Known Allergies  ROS:  Denies fevers, chills, headaches, dizziness, chest pain, edema, bleeding/bruising, urinary complaints, depression/anxiety, URI complaints or other problems.  PHYSICAL EXAM: BP 130/88  Pulse 64  Ht 6' 0.5" (1.842 m)  Wt 226 lb (102.513 kg)  BMI 30.21 kg/m2 132/84 on repeat by MD Pleasant obese male in no distress Neck: no lymphadenopathy, thyromegaly or mass Heart: regular rate and rhythm Lungs: clear bilaterally Abdomen: soft, nontender, no mass Extremities: no edema, 2+ pulse Skin: no rashes, lesions Psych: normal mood, affect, hygiene and grooming  ASSESSMENT/PLAN:  Essential hypertension, benign - controlled.  daily aerobic exercise and weight loss is recommended - Plan: Comprehensive metabolic panel  Prostate cancer - active surveillance per urologist  Gout - Plan: Uric acid  Pure hypercholesterolemia - Plan: Lipid panel  Encounter for long-term (current) use of other medications - Plan: Comprehensive metabolic panel, Uric acid  Pt needs CPE by end of calendar year.  F/u at CPE

## 2012-10-13 ENCOUNTER — Encounter: Payer: Self-pay | Admitting: Family Medicine

## 2012-10-20 ENCOUNTER — Encounter: Payer: Self-pay | Admitting: Family Medicine

## 2013-01-24 ENCOUNTER — Other Ambulatory Visit: Payer: Self-pay | Admitting: Family Medicine

## 2013-02-01 ENCOUNTER — Other Ambulatory Visit: Payer: 59

## 2013-02-01 DIAGNOSIS — I10 Essential (primary) hypertension: Secondary | ICD-10-CM

## 2013-02-01 DIAGNOSIS — M109 Gout, unspecified: Secondary | ICD-10-CM

## 2013-02-01 DIAGNOSIS — E7800 Pure hypercholesterolemia, unspecified: Secondary | ICD-10-CM

## 2013-02-01 DIAGNOSIS — Z79899 Other long term (current) drug therapy: Secondary | ICD-10-CM

## 2013-02-02 LAB — LIPID PANEL
Cholesterol: 184 mg/dL (ref 0–200)
HDL: 52 mg/dL (ref >39)
LDL Cholesterol: 113 mg/dL — ABNORMAL HIGH (ref 0–99)
Total CHOL/HDL Ratio: 3.5 Ratio
Triglycerides: 97 mg/dL (ref <150)
VLDL: 19 mg/dL (ref 0–40)

## 2013-02-02 LAB — COMPREHENSIVE METABOLIC PANEL
ALT: 23 U/L (ref 0–53)
AST: 17 U/L (ref 0–37)
Albumin: 4.5 g/dL (ref 3.5–5.2)
Alkaline Phosphatase: 69 U/L (ref 39–117)
BUN: 19 mg/dL (ref 6–23)
CO2: 26 mEq/L (ref 19–32)
Calcium: 9.8 mg/dL (ref 8.4–10.5)
Chloride: 99 mEq/L (ref 96–112)
Creat: 0.85 mg/dL (ref 0.50–1.35)
Glucose, Bld: 100 mg/dL — ABNORMAL HIGH (ref 70–99)
Potassium: 4.5 mEq/L (ref 3.5–5.3)
Sodium: 134 mEq/L — ABNORMAL LOW (ref 135–145)
Total Bilirubin: 0.6 mg/dL (ref 0.3–1.2)
Total Protein: 6.9 g/dL (ref 6.0–8.3)

## 2013-02-02 LAB — URIC ACID: Uric Acid, Serum: 6.1 mg/dL (ref 4.0–7.8)

## 2013-02-11 ENCOUNTER — Ambulatory Visit (INDEPENDENT_AMBULATORY_CARE_PROVIDER_SITE_OTHER): Payer: 59 | Admitting: Family Medicine

## 2013-02-11 ENCOUNTER — Encounter: Payer: Self-pay | Admitting: Family Medicine

## 2013-02-11 VITALS — BP 140/80 | HR 72 | Ht 73.0 in | Wt 228.0 lb

## 2013-02-11 DIAGNOSIS — Z23 Encounter for immunization: Secondary | ICD-10-CM

## 2013-02-11 DIAGNOSIS — N529 Male erectile dysfunction, unspecified: Secondary | ICD-10-CM

## 2013-02-11 DIAGNOSIS — M109 Gout, unspecified: Secondary | ICD-10-CM

## 2013-02-11 DIAGNOSIS — C61 Malignant neoplasm of prostate: Secondary | ICD-10-CM

## 2013-02-11 DIAGNOSIS — I10 Essential (primary) hypertension: Secondary | ICD-10-CM

## 2013-02-11 DIAGNOSIS — Z0000 Encounter for general adult medical examination without abnormal findings: Secondary | ICD-10-CM

## 2013-02-11 LAB — POCT URINALYSIS DIPSTICK
Bilirubin, UA: NEGATIVE
Blood, UA: NEGATIVE
Glucose, UA: NEGATIVE
Ketones, UA: NEGATIVE
Leukocytes, UA: NEGATIVE
Nitrite, UA: NEGATIVE
Protein, UA: NEGATIVE
Spec Grav, UA: 1.01
Urobilinogen, UA: NEGATIVE
pH, UA: 7

## 2013-02-11 LAB — PSA: PSA: 4.97 ng/mL — ABNORMAL HIGH (ref <=4.00)

## 2013-02-11 MED ORDER — SILDENAFIL CITRATE 100 MG PO TABS
ORAL_TABLET | ORAL | Status: DC
Start: 2013-02-11 — End: 2014-01-23

## 2013-02-11 MED ORDER — LISINOPRIL-HYDROCHLOROTHIAZIDE 20-25 MG PO TABS: ORAL_TABLET | ORAL | Status: DC

## 2013-02-11 MED ORDER — ALLOPURINOL 300 MG PO TABS: ORAL_TABLET | ORAL | Status: DC

## 2013-02-11 NOTE — Patient Instructions (Signed)

## 2013-02-11 NOTE — Progress Notes (Signed)
Chief Complaint  Patient presents with  . Annual Exam    nonfasting physical, labs already done. No concerns. Has form to be completed. Would like PSA done today if possible.    Mark Caldwell is a 67 y.o. male who presents for a complete physical.  He has the following concerns:  He is here for med check and needs refills.  He has been traveling 20 days in the last month, eating out a lot. He is seeing urologist for prostate cancer, but would like PSA checked today, rather than waiting until his next urology appointment.  He is not getting treatment, just surveillance at this time.  Gout--no flares in the last year. Compliant with his medication.  HTN--120-130/80 at home, but isn't home often to check. Forgot to bring his list of BP's. Denies headaches, dizziness, edema, chest pain.  ED-- using Viagra prn with good results.  Immunization History  Administered Date(s) Administered  . Hepatitis A 01/27/2006  . Influenza Split 01/10/2009, 01/03/2011, 01/06/2012  . Influenza, High Dose Seasonal PF 02/11/2013  . Tdap 02/22/2009  . Zoster 01/25/2006   Last colonoscopy: 2011, normal per patient  Last PSA: per Dr. Retta Diones.  Pt has upcoming appt with him in Feb, but is requesting PSA today  Dentist: yearly  Ophtho: yearly  Exercise:  Walks a lot with his traveling.  No other exercise.  Plans to work less and be able to exercise more in the next year.  Past Medical History  Diagnosis Date  . Hemorrhoids   . Gout   . Right rotator cuff tear   . Hypertension     elevated BP.  Marland Kitchen Partial tear of Achilles tendon summer 2005    left  . Premature ejaculation   . ED (erectile dysfunction)   . Prostate cancer     surveillance; Dr. Retta Diones    Past Surgical History  Procedure Laterality Date  . Pyloric stenosis surgery  28 weeks old.  . Tonsillectomy    . Vasectomy    . Rotator cuff repair      right; Dr. Jerl Santos    History   Social History  . Marital Status: Married    Spouse  Name: N/A    Number of Children: 2  . Years of Education: N/A   Occupational History  . leadership training    Social History Main Topics  . Smoking status: Former Smoker    Types: Pipe  . Smokeless tobacco: Never Used     Comment: quit smoking pipe in the 80's  . Alcohol Use: Yes     Comment: 8-10 drinks per week.  . Drug Use: No  . Sexual Activity: Yes    Partners: Female   Other Topics Concern  . Not on file   Social History Narrative   Lives with wife, stepson and dog.  Son died at 27.  71 year old stepson.  Daughter in [place], Son in [place].  Travels a lot with his job    Family History  Problem Relation Age of Onset  . Asthma Mother   . Osteoporosis Mother   . Leukemia Father   . Hyperlipidemia Father     increased chol  . Coronary artery disease Father 73    CABG  . Cancer Father     leukemia  . Down syndrome Brother   . Deep vein thrombosis Brother 20  . Gout Brother   . Dementia Brother   . Hodgkin's lymphoma Son   . Gout Son   .  Cancer Maternal Grandfather     lung (smoker)  . Diabetes Neg Hx     Current outpatient prescriptions:allopurinol (ZYLOPRIM) 300 MG tablet, take 1 tablet by mouth once daily, Disp: 90 tablet, Rfl: 3;  lisinopril-hydrochlorothiazide (PRINZIDE,ZESTORETIC) 20-25 MG per tablet, take 1 tablet by mouth once daily, Disp: 90 tablet, Rfl: 3;  sildenafil (VIAGRA) 100 MG tablet, take 1/2 to 1 tablet by mouth once daily if needed for ERECTILE DYSFUNCTION, Disp: 15 tablet, Rfl: 11 tamsulosin (FLOMAX) 0.4 MG CAPS capsule, Take 0.4 mg by mouth daily., Disp: , Rfl: ;  zolpidem (AMBIEN) 10 MG tablet, Take 1 tablet (10 mg total) by mouth at bedtime as needed for sleep., Disp: 20 tablet, Rfl: 1  No Known Allergies  ROS: The patient denies anorexia, fever, headaches, weight changes, vision loss, decreased hearing, ear pain, hoarseness, chest pain, palpitations, dizziness, syncope, dyspnea on exertion, cough, swelling, nausea, vomiting, diarrhea,  constipation, abdominal pain, melena, hematochezia, hematuria, incontinence, nocturia, weakened urine stream, dysuria, genital lesions, joint pains, numbness, tingling, weakness, tremor, depression, anxiety, abnormal bleeding/bruising, or enlarged lymph nodes  Premature ejaculation (and mild ED)-improved/controlled with Viagra.   PHYSICAL EXAM: BP 140/80  Pulse 72  Ht 6\' 1"  (1.854 m)  Wt 228 lb (103.42 kg)  BMI 30.09 kg/m2  General Appearance:  Alert, cooperative, no distress, appears stated age   Head:  Normocephalic, without obvious abnormality, atraumatic   Eyes:  PERRL, conjunctiva/corneas clear, EOM's intact, fundi  benign   Ears:  Normal TM's and external ear canals. Some dry white wax in L EAC. TM's are normal  Nose:  Nares normal, mucosa normal, no drainage or sinus tenderness   Throat:  Lips, mucosa, and tongue normal; teeth and gums normal   Neck:  Supple, no lymphadenopathy; thyroid: no enlargement/tenderness/nodules; no carotid  bruit or JVD   Back:  Spine nontender, no curvature, ROM normal, no CVA tenderness   Lungs:  Clear to auscultation bilaterally without wheezes, rales or ronchi; respirations unlabored   Chest Wall:  No tenderness or deformity   Heart:  Regular rate and rhythm, S1 and S2 normal, no murmur, rub  or gallop   Breast Exam:  No chest wall tenderness, masses or gynecomastia   Abdomen:  Soft, non-tender, nondistended, normoactive bowel sounds,  no masses, no hepatosplenomegaly. +abdominal obesity   Genitalia:  Testicles descended bilaterally without masses.  No inguinal hernias, no skin lesions  Rectal:  Deferred to urology   Extremities:  No clubbing, cyanosis or edema   Pulses:  2+ and symmetric all extremities   Skin:  Skin color, texture, turgor normal, no rashes or lesions. Skin tag (small) L neck. Nontender. Angiomas on chest/abdomen.   Lymph nodes:  Cervical, supraclavicular, and axillary nodes normal   Neurologic:  CNII-XII intact, normal  strength, sensation and gait; reflexes 2+ and symmetric throughout          Psych: Normal mood, affect, hygiene and grooming.   ASSESSMENT/PLAN:  Routine general medical examination at a health care facility - Plan: Visual acuity screening, POCT Urinalysis Dipstick  Need for prophylactic vaccination and inoculation against influenza - Plan: Flu vaccine HIGH DOSE PF (Fluzone Tri High dose)  Essential hypertension, benign - controlled. Continue monitoring BP at home. Daily exercise, weight loss and low sodium diet encouraged.  Continue current meds - Plan: lisinopril-hydrochlorothiazide (PRINZIDE,ZESTORETIC) 20-25 MG per tablet, EKG 12-Lead  Gout - controlled; no flares while on allopurinol - Plan: allopurinol (ZYLOPRIM) 300 MG tablet  ED (erectile dysfunction) - Plan: sildenafil (VIAGRA)  100 MG tablet  Prostate cancer - check PSA per pt request, and forward to his urologist - Plan: PSA  Need for prophylactic vaccination against Streptococcus pneumoniae (pneumococcus) - Plan: Pneumococcal conjugate vaccine 13-valent  Recommended at least 30 minutes of aerobic activity at least 5 days/week; proper sunscreen use reviewed; healthy diet and alcohol recommendations (less than or equal to 2 drinks/day) reviewed; regular seatbelt use; changing batteries in smoke detectors. Self-testicular exams. Immunization recommendations discussed--flu shot and prevnar-13 today.  Colonoscopy recommendations reviewed--UTD  EKG today (baseline)--Normal  F/u 6 months for med check, sooner prn

## 2013-11-03 ENCOUNTER — Telehealth: Payer: Self-pay | Admitting: Family Medicine

## 2013-11-03 MED ORDER — ONDANSETRON 8 MG PO TBDP: 8.0000 mg | ORAL_TABLET | Freq: Three times a day (TID) | ORAL | Status: DC | PRN

## 2013-11-03 NOTE — Telephone Encounter (Signed)
rx sent and patient notified.  

## 2013-11-03 NOTE — Telephone Encounter (Signed)
Pt out of town in [place]. Ate seafood for lunch yesterday and few ours later started throwing up and has been throwing up all night . Requesting med be called [facility] to [facility] [place] [phone] to help before pt has to get on plane to travel again.

## 2013-11-03 NOTE — Telephone Encounter (Signed)
Please call in 8mg  zofran (generic) ODT #10

## 2013-11-05 ENCOUNTER — Emergency Department (HOSPITAL_COMMUNITY)
Admission: EM | Admit: 2013-11-05 | Discharge: 2013-11-05 | Disposition: A | Discharge status: Home / Self Care | Payer: 59 | Attending: Emergency Medicine | Admitting: Emergency Medicine

## 2013-11-05 ENCOUNTER — Ambulatory Visit (INDEPENDENT_AMBULATORY_CARE_PROVIDER_SITE_OTHER): Payer: 59 | Admitting: Family Medicine

## 2013-11-05 ENCOUNTER — Encounter (HOSPITAL_COMMUNITY): Payer: Self-pay | Admitting: Emergency Medicine

## 2013-11-05 ENCOUNTER — Encounter: Payer: Self-pay | Admitting: Family Medicine

## 2013-11-05 ENCOUNTER — Telehealth: Payer: Self-pay | Admitting: Family Medicine

## 2013-11-05 ENCOUNTER — Ambulatory Visit
Admission: RE | Admit: 2013-11-05 | Discharge: 2013-11-05 | Disposition: A | Discharge status: Home / Self Care | Payer: Medicare Other | Source: Ambulatory Visit | Attending: Family Medicine | Admitting: Family Medicine

## 2013-11-05 ENCOUNTER — Other Ambulatory Visit: Payer: Self-pay | Admitting: Family Medicine

## 2013-11-05 ENCOUNTER — Emergency Department (HOSPITAL_COMMUNITY): Payer: 59

## 2013-11-05 VITALS — BP 150/90 | HR 55 | Temp 98.1°F

## 2013-11-05 DIAGNOSIS — R1110 Vomiting, unspecified: Secondary | ICD-10-CM

## 2013-11-05 DIAGNOSIS — R112 Nausea with vomiting, unspecified: Secondary | ICD-10-CM | POA: Diagnosis not present

## 2013-11-05 DIAGNOSIS — Z872 Personal history of diseases of the skin and subcutaneous tissue: Secondary | ICD-10-CM | POA: Insufficient documentation

## 2013-11-05 DIAGNOSIS — Z8546 Personal history of malignant neoplasm of prostate: Secondary | ICD-10-CM | POA: Insufficient documentation

## 2013-11-05 DIAGNOSIS — Z87891 Personal history of nicotine dependence: Secondary | ICD-10-CM | POA: Insufficient documentation

## 2013-11-05 DIAGNOSIS — I10 Essential (primary) hypertension: Secondary | ICD-10-CM | POA: Diagnosis not present

## 2013-11-05 DIAGNOSIS — R109 Unspecified abdominal pain: Secondary | ICD-10-CM | POA: Diagnosis not present

## 2013-11-05 DIAGNOSIS — Z79899 Other long term (current) drug therapy: Secondary | ICD-10-CM | POA: Insufficient documentation

## 2013-11-05 DIAGNOSIS — Z8679 Personal history of other diseases of the circulatory system: Secondary | ICD-10-CM | POA: Insufficient documentation

## 2013-11-05 DIAGNOSIS — N529 Male erectile dysfunction, unspecified: Secondary | ICD-10-CM | POA: Insufficient documentation

## 2013-11-05 DIAGNOSIS — R1115 Cyclical vomiting syndrome unrelated to migraine: Secondary | ICD-10-CM

## 2013-11-05 LAB — CBC WITH DIFFERENTIAL/PLATELET
Basophils Absolute: 0 10*3/uL (ref 0.0–0.1)
Basophils Relative: 0 % (ref 0–1)
Eosinophils Absolute: 0 10*3/uL (ref 0.0–0.7)
Eosinophils Relative: 0 % (ref 0–5)
HCT: 44.2 % (ref 39.0–52.0)
HCT: 45.3 % (ref 39.0–52.0)
Hemoglobin: 14.9 g/dL (ref 13.0–17.0)
Hemoglobin: 15.7 g/dL (ref 13.0–17.0)
Lymphocytes Relative: 18 % (ref 12–46)
Lymphocytes Relative: 17 % (ref 12–46)
Lymphs Abs: 1.7 10*3/uL (ref 0.7–4.0)
Lymphs Abs: 1.6 10*3/uL (ref 0.7–4.0)
MCH: 31.3 pg (ref 26.0–34.0)
MCH: 30.8 pg (ref 26.0–34.0)
MCHC: 33.8 g/dL (ref 30.0–36.0)
MCHC: 34.7 g/dL (ref 30.0–36.0)
MCV: 92.7 fL (ref 78.0–100.0)
MCV: 88.8 fL (ref 78.0–100.0)
Monocytes Absolute: 0.6 10*3/uL (ref 0.1–1.0)
Monocytes Absolute: 0.9 10*3/uL (ref 0.1–1.0)
Monocytes Relative: 6 % (ref 3–12)
Monocytes Relative: 10 % (ref 3–12)
Neutro Abs: 7.4 10*3/uL (ref 1.7–7.7)
Neutro Abs: 6.8 10*3/uL (ref 1.7–7.7)
Neutrophils Relative %: 76 % (ref 43–77)
Neutrophils Relative %: 73 % (ref 43–77)
Platelets: 283 10*3/uL (ref 150–400)
Platelets: 282 10*3/uL (ref 150–400)
RBC: 4.76 MIL/uL (ref 4.22–5.81)
RBC: 5.1 MIL/uL (ref 4.22–5.81)
RDW: 13.2 % (ref 11.5–15.5)
RDW: 13.1 % (ref 11.5–15.5)
WBC: 9.7 10*3/uL (ref 4.0–10.5)
WBC: 9.3 10*3/uL (ref 4.0–10.5)

## 2013-11-05 LAB — COMPREHENSIVE METABOLIC PANEL
ALT: 18 U/L (ref 0–53)
AST: 14 U/L (ref 0–37)
Albumin: 4.2 g/dL (ref 3.5–5.2)
Alkaline Phosphatase: 71 U/L (ref 39–117)
Anion gap: 14 (ref 5–15)
BUN: 27 mg/dL — ABNORMAL HIGH (ref 6–23)
CO2: 25 mEq/L (ref 19–32)
Calcium: 9.7 mg/dL (ref 8.4–10.5)
Chloride: 95 mEq/L — ABNORMAL LOW (ref 96–112)
Creatinine, Ser: 0.94 mg/dL (ref 0.50–1.35)
GFR calc Af Amer: >90 mL/min (ref >90)
GFR calc non Af Amer: 85 mL/min — ABNORMAL LOW (ref >90)
Glucose, Bld: 110 mg/dL — ABNORMAL HIGH (ref 70–99)
Potassium: 3.8 mEq/L (ref 3.7–5.3)
Sodium: 134 mEq/L — ABNORMAL LOW (ref 137–147)
Total Bilirubin: 1 mg/dL (ref 0.3–1.2)
Total Protein: 7.6 g/dL (ref 6.0–8.3)

## 2013-11-05 LAB — BASIC METABOLIC PANEL
BUN: 29 mg/dL — ABNORMAL HIGH (ref 6–23)
CO2: 30 mEq/L (ref 19–32)
Calcium: 10.9 mg/dL — ABNORMAL HIGH (ref 8.4–10.5)
Chloride: 98 mEq/L (ref 96–112)
Creat: 0.9 mg/dL (ref 0.50–1.35)
Glucose, Bld: 128 mg/dL — ABNORMAL HIGH (ref 70–99)
Potassium: 4.1 mEq/L (ref 3.5–5.3)
Sodium: 138 mEq/L (ref 135–145)

## 2013-11-05 LAB — URINALYSIS, ROUTINE W REFLEX MICROSCOPIC
Bilirubin Urine: NEGATIVE
Glucose, UA: NEGATIVE mg/dL
Hgb urine dipstick: NEGATIVE
Ketones, ur: NEGATIVE mg/dL
Leukocytes, UA: NEGATIVE
Nitrite: NEGATIVE
Protein, ur: NEGATIVE mg/dL
Specific Gravity, Urine: 1.031 — ABNORMAL HIGH (ref 1.005–1.030)
Urobilinogen, UA: 1 mg/dL (ref 0.0–1.0)
pH: 6 (ref 5.0–8.0)

## 2013-11-05 LAB — I-STAT CG4 LACTIC ACID, ED: Lactic Acid, Venous: 1.13 mmol/L (ref 0.5–2.2)

## 2013-11-05 MED ORDER — ONDANSETRON HCL 4 MG/2ML IJ SOLN
4.0000 mg | Freq: Once | INTRAMUSCULAR | Status: AC
  Administered 2013-11-05: 4 mg via INTRAVENOUS
  Filled 2013-11-05: qty 2

## 2013-11-05 MED ORDER — SODIUM CHLORIDE 0.9 % IV BOLUS (SEPSIS)
1000.0000 mL | Freq: Once | INTRAVENOUS | Status: AC
  Administered 2013-11-05: 1000 mL via INTRAVENOUS

## 2013-11-05 MED ORDER — PROMETHAZINE HCL 25 MG/ML IJ SOLN
25.0000 mg | Freq: Once | INTRAMUSCULAR | Status: AC
  Administered 2013-11-05: 25 mg via INTRAMUSCULAR
  Filled 2013-11-05: qty 1

## 2013-11-05 MED ORDER — PROMETHAZINE HCL 25 MG RE SUPP: 25.0000 mg | Freq: Four times a day (QID) | RECTAL | Status: DC | PRN

## 2013-11-05 MED ORDER — IOHEXOL 300 MG/ML  SOLN
50.0000 mL | Freq: Once | INTRAMUSCULAR | Status: AC | PRN
  Administered 2013-11-05: 50 mL via ORAL

## 2013-11-05 MED ORDER — ONDANSETRON 8 MG PO TBDP: 8.0000 mg | ORAL_TABLET | Freq: Three times a day (TID) | ORAL | Status: DC | PRN

## 2013-11-05 MED ORDER — IOHEXOL 300 MG/ML  SOLN
100.0000 mL | Freq: Once | INTRAMUSCULAR | Status: AC | PRN
  Administered 2013-11-05: 100 mL via INTRAVENOUS

## 2013-11-05 NOTE — Discharge Instructions (Signed)

## 2013-11-05 NOTE — ED Notes (Signed)
Pt here with n/v/abdominal pain since Tuesday.  This morning.  Pt with very limited intake for past week.  Pt went to MD this morning.  Pt had xrays and labs done today at MD office.  Pt has had no bm since Tuesday.  Pt has had temp 98.9.  MD told pt to come here.

## 2013-11-05 NOTE — Telephone Encounter (Signed)
Pt's wife called and was requesting labs and xray info. She was under the impression she would have heard something by now. I did explain how it works to get lab/xray results back. I did tell her I would put back a note back asking you to call her as soon as you can. Pt's wife can be reached at [phone].

## 2013-11-05 NOTE — Progress Notes (Signed)
   Subjective:    Patient ID: Mark Caldwell, male    DOB: 1945-06-24, 68 y.o.   MRN: [id number]  HPI On Tuesday of this week he had the onset of vomiting 2 hours after he did a fish meal. He did complain of malaise and myalgias but no abdominal pain, diarrhea or he has had some burning chest discomfort with the vomiting. He was given Zofran which has been taking twice per day. There is also previous history of occasional vomiting and a feeling of early satiety.   Review of Systems     Objective:   Physical Exam Alert and in no distress. Abdominal exam shows bowel sounds that appeared normal. No masses or tenderness were noted. Cardiac exam was regular rhythm without murmurs or gallops. Lungs clear to auscultation.      Assessment & Plan:  Intractable vomiting with nausea, vomiting of unspecified type - Plan: CBC with Differential, Basic Metabolic Panel, DG Abd 2 Views  blood work and x-rays were nondiagnostic. He was sent home before the blood work was back. I called with the blood work recommend a more conservative care for further protracted vomiting has continued and I did recommend that he go to the emergency room for further evaluation. History makes me concerned over another cause for his vomiting since she's not had really any diarrhea and he also has history of  early satiety.

## 2013-11-05 NOTE — ED Notes (Signed)
Patient transported to CT 

## 2013-11-05 NOTE — ED Provider Notes (Signed)
CSN: [id number]     Arrival date & time 11/05/13  1331 History   First MD Initiated Contact with Patient 11/05/13 1506     Chief Complaint  Patient presents with  . Emesis  . Abdominal Pain     (Consider location/radiation/quality/duration/timing/severity/associated sxs/prior Treatment) HPI Comments: Patient is a 68 year old male with history of gout, hypertension, prostate cancer who presents to the emergency department for evaluation of nausea and vomiting. He reports that this began on Tuesday. On Tuesday he had several violent episodes of emesis. He was in [place] visiting and called his doctor who called him in a prescription for Zofran. He has used Zofran with little relief of his symptoms. He tried to eat some soup yesterday and began to again have violent vomiting. He reports that his body hurts all over. He denies any diarrhea. Last bowel movement was Tuesday. He went to see his doctor earlier today and it was recommended that he come to the emergency department for further evaluation. His only abdominal surgery was for pyloric stenosis when he was 69 weeks old.  Patient is a 68 y.o. male presenting with vomiting and abdominal pain. The history is provided by the patient. No language interpreter was used.  Emesis Associated symptoms: abdominal pain   Associated symptoms: no chills and no diarrhea   Abdominal Pain Associated symptoms: nausea and vomiting   Associated symptoms: no chest pain, no chills, no diarrhea, no dysuria, no fever and no shortness of breath     Past Medical History  Diagnosis Date  . Hemorrhoids   . Gout   . Right rotator cuff tear   . Hypertension     elevated BP.  Marland Kitchen Partial tear of Achilles tendon summer 2005    left  . Premature ejaculation   . ED (erectile dysfunction)   . Prostate cancer     surveillance; Dr. Diona Fanti   Past Surgical History  Procedure Laterality Date  . Pyloric stenosis surgery  86 weeks old.  . Tonsillectomy    .  Vasectomy    . Rotator cuff repair      right; Dr. Rhona Raider   Family History  Problem Relation Age of Onset  . Asthma Mother   . Osteoporosis Mother   . Leukemia Father   . Hyperlipidemia Father     increased chol  . Coronary artery disease Father 49    CABG  . Cancer Father     leukemia  . Down syndrome Brother   . Deep vein thrombosis Brother 20  . Gout Brother   . Dementia Brother   . Hodgkin's lymphoma Son   . Gout Son   . Cancer Maternal Grandfather     lung (smoker)  . Diabetes Neg Hx    History  Substance Use Topics  . Smoking status: Former Smoker    Types: Pipe  . Smokeless tobacco: Never Used     Comment: quit smoking pipe in the 80's  . Alcohol Use: Yes     Comment: 8-10 drinks per week.    Review of Systems  Constitutional: Negative for fever and chills.  Respiratory: Negative for shortness of breath.   Cardiovascular: Negative for chest pain.  Gastrointestinal: Positive for nausea, vomiting and abdominal pain. Negative for diarrhea.  Genitourinary: Negative for dysuria.  All other systems reviewed and are negative.     Allergies  Review of patient's allergies indicates no known allergies.  Home Medications   Prior to Admission medications   Medication Sig Start  Date End Date Taking? Authorizing Provider  allopurinol (ZYLOPRIM) 300 MG tablet take 1 tablet by mouth once daily 02/11/13  Yes Rita Ohara, MD  lisinopril-hydrochlorothiazide (PRINZIDE,ZESTORETIC) 20-25 MG per tablet take 1 tablet by mouth once daily 02/11/13  Yes Rita Ohara, MD  ondansetron (ZOFRAN-ODT) 8 MG disintegrating tablet Take 1 tablet (8 mg total) by mouth every 8 (eight) hours as needed for nausea or vomiting. 11/03/13  Yes Rita Ohara, MD  sildenafil (VIAGRA) 100 MG tablet take 1/2 to 1 tablet by mouth once daily if needed for ERECTILE DYSFUNCTION 02/11/13  Yes Rita Ohara, MD  tamsulosin (FLOMAX) 0.4 MG CAPS capsule Take 0.4 mg by mouth daily.   Yes Historical Provider, MD   BP  143/85  Pulse 61  Temp(Src) 98.3 F (36.8 C) (Oral)  Resp 18  SpO2 100% Physical Exam  Nursing note and vitals reviewed. Constitutional: He is oriented to person, place, and time. He appears well-developed and well-nourished. No distress.  HENT:  Head: Normocephalic and atraumatic.  Right Ear: External ear normal.  Left Ear: External ear normal.  Nose: Nose normal.  Eyes: Conjunctivae are normal.  Neck: Normal range of motion. No tracheal deviation present.  Cardiovascular: Normal rate, regular rhythm and normal heart sounds.   Pulmonary/Chest: Effort normal and breath sounds normal. No stridor.  Abdominal: Soft. He exhibits no distension. There is no tenderness.  Musculoskeletal: Normal range of motion.  Neurological: He is alert and oriented to person, place, and time.  Skin: Skin is warm and dry. He is not diaphoretic.  Psychiatric: He has a normal mood and affect. His behavior is normal.    ED Course  Procedures (including critical care time) Labs Review Labs Reviewed  URINALYSIS, ROUTINE W REFLEX MICROSCOPIC - Abnormal; Notable for the following:    Color, Urine AMBER (*)    Specific Gravity, Urine 1.031 (*)    All other components within normal limits  COMPREHENSIVE METABOLIC PANEL - Abnormal; Notable for the following:    Sodium 134 (*)    Chloride 95 (*)    Glucose, Bld 110 (*)    BUN 27 (*)    GFR calc non Af Amer 85 (*)    All other components within normal limits  CBC WITH DIFFERENTIAL  I-STAT CG4 LACTIC ACID, ED    Imaging Review Ct Abdomen Pelvis W Contrast  11/05/2013   CLINICAL DATA:  68 year old male with abdominal pelvic pain, nausea and vomiting.  EXAM: CT ABDOMEN AND PELVIS WITH CONTRAST  TECHNIQUE: Multidetector CT imaging of the abdomen and pelvis was performed using the standard protocol following bolus administration of intravenous contrast.  CONTRAST:  126mL OMNIPAQUE IOHEXOL 300 MG/ML  SOLN  COMPARISON:  11/05/2013 radiographs  FINDINGS:  Cardiomegaly identified.  Mild fatty infiltration of the liver is present without other hepatic abnormality.  A 2 cm hypodense lesion within the pancreatic head/ uncinate process is indeterminate. The remainder of the pancreas is unremarkable. There is no evidence of pancreatic ductal dilatation.  The adrenal glands, spleen, gallbladder and kidneys are unremarkable.  There is no evidence of free fluid, enlarged lymph nodes, biliary dilation or abdominal aortic aneurysm.  Descending and sigmoid colonic diverticulosis noted without diverticulitis.  The appendix and bladder are unremarkable.  There is no evidence of bowel obstruction, pneumoperitoneum or abscess.  A small left inguinal hernia containing fat is present.  Prostate enlargement is identified.  No acute or suspicious bony abnormalities are noted. Mild multilevel degenerative changes in the lumbar spine noted.  IMPRESSION:  No evidence of acute abnormality.  2 cm indeterminate lesion within the pancreatic head/ uncinate process - likely a cystic lesion. MRI with and without contrast is recommended. Non-emergent MRI should be deferred until patient has been discharged for the acute illness, and can optimally cooperate with positioning and breath-holding instructions.  Colonic diverticulosis without diverticulitis.  Small left inguinal hernia containing fat.  Mild fatty infiltration liver, cardiomegaly a prostate enlargement.   Electronically Signed   By: Hassan Rowan M.D.   On: 11/05/2013 18:52   Dg Abd 2 Views  11/05/2013   CLINICAL DATA:  Pain following.  EXAM: ABDOMEN - 2 VIEW  COMPARISON:  None.  FINDINGS: Soft tissue structures are unremarkable. Gas pattern is nonspecific. Pelvic phleboliths. Degenerative changes lumbar spine and both hips. Lung bases clear.  IMPRESSION: Nonspecific exam.   Electronically Signed   By: Marcello Moores  Register   On: 11/05/2013 09:56     EKG Interpretation None      MDM   Final diagnoses:  Non-intractable vomiting with  nausea, vomiting of unspecified type    Patient is nontoxic, nonseptic appearing, in no apparent distress.  Patient's pain and other symptoms adequately managed in emergency department.  Fluid bolus given.  Labs, imaging and vitals reviewed.  Patient does not meet the SIRS or Sepsis criteria.  On repeat exam patient does not have a surgical abdomen and there are no peritoneal signs.  No indication of appendicitis, bowel obstruction, bowel perforation, cholecystitis, diverticulitis.  Patient discharged home with symptomatic treatment and given strict instructions for follow-up with their primary care physician. I have also discussed reasons to return immediately to the ER.  Dr. Mingo Amber evaluated patient and agrees with plan. Patient expresses understanding and agrees with plan.       Elwyn Lade, PA-C 11/06/13 0140

## 2013-11-06 NOTE — ED Provider Notes (Signed)
Medical screening examination/treatment/procedure(s) were conducted as a shared visit with non-physician practitioner(s) and myself.  I personally evaluated the patient during the encounter.   EKG Interpretation None      Patient here with nausea and vomiting. CT scan ok. Feeling better after anti-emetics, fluids. Stable for discharge.  Evelina Bucy, MD 11/06/13 [phone]

## 2013-11-08 ENCOUNTER — Telehealth: Payer: Self-pay

## 2013-11-08 NOTE — Telephone Encounter (Signed)
MR.Mark Caldwell CALLED AND SAID HE WENT TO ER GOT FLUIDS AND THEY DONE CT OR X-RAY BUT ANY HOW THEY SAW A SPOT ON PANCREASE AND SUGGESTED A MRI FOR FURTHER EVAL. PATIENT WANTED [place] TO GET THIS SCHEDULED PLEASE ADVISE

## 2013-11-09 ENCOUNTER — Other Ambulatory Visit: Payer: Self-pay | Admitting: *Deleted

## 2013-11-09 ENCOUNTER — Encounter: Payer: Self-pay | Admitting: Family Medicine

## 2013-11-09 DIAGNOSIS — K869 Disease of pancreas, unspecified: Secondary | ICD-10-CM

## 2013-11-09 NOTE — Telephone Encounter (Signed)
Talk to me about this one

## 2013-11-09 NOTE — Telephone Encounter (Signed)
Pt notified by MyChart message that we are contacting GI to decide if MRI is appropriate step, vs referral to GI (Dr. Ardis Hughs)

## 2013-11-09 NOTE — Telephone Encounter (Signed)
Please set up MRI for patient (see CT report). Please call pt and let him know that we contact Dr Ardis Hughs (GI) who agreed with recommendation to proceed with MRI.

## 2013-11-11 ENCOUNTER — Encounter: Payer: Self-pay | Admitting: Family Medicine

## 2013-11-11 ENCOUNTER — Ambulatory Visit (INDEPENDENT_AMBULATORY_CARE_PROVIDER_SITE_OTHER): Payer: 59 | Admitting: Family Medicine

## 2013-11-11 ENCOUNTER — Telehealth: Payer: Self-pay | Admitting: *Deleted

## 2013-11-11 VITALS — BP 100/68 | HR 72 | Temp 97.8°F | Ht 73.0 in | Wt 216.0 lb

## 2013-11-11 DIAGNOSIS — R1110 Vomiting, unspecified: Secondary | ICD-10-CM

## 2013-11-11 DIAGNOSIS — K219 Gastro-esophageal reflux disease without esophagitis: Secondary | ICD-10-CM

## 2013-11-11 DIAGNOSIS — R9389 Abnormal findings on diagnostic imaging of other specified body structures: Secondary | ICD-10-CM | POA: Insufficient documentation

## 2013-11-11 DIAGNOSIS — R1111 Vomiting without nausea: Secondary | ICD-10-CM

## 2013-11-11 MED ORDER — ESOMEPRAZOLE MAGNESIUM 40 MG PO CPDR: 40.0000 mg | DELAYED_RELEASE_CAPSULE | Freq: Every day | ORAL | Status: DC

## 2013-11-11 NOTE — Telephone Encounter (Signed)
Patient called and stated that he had a bout of vomiting last night about 1am after being symptom free all week. Wants to know if you think adding an endoscopy would be helpful. He is worried bc he is scheduled to go out of the country next week. He is on a plane flying home from [place] at the [employer] [facility] be here at 11:00am in [place] area.

## 2013-11-11 NOTE — Telephone Encounter (Signed)
No, not at this point.  But he can add OTC Nexium or Prilosec daily if having any upper abdominal pain (if there is any kind of gastritis related to recent illness)

## 2013-11-11 NOTE — Progress Notes (Signed)
Chief Complaint  Patient presents with  . Advice Only    wanted to discuss everything that has been going on. Was fine all week, very strong yesterday at noon. Awakened at 1:00am by vomiting.    While in [place], visiting his mother, he started vomiting "violently" 2-3 hrs after eating seafood on 9/8.  He had vomited a couple of times earlier that week, after eating large meals. Admitted to eating poorly (fried foods, large meals).  The emesis on 9/8 had him up 15-20 times during the night vomiting.  The following morning he took zofran.  Only vomited twice on Wed (travel day) and didn't eat anything. He had slight diarrhea after eating lunch on 9/8, prior to onset of vomiting. 9/10 was feeling better, ate Chinese soup for lunch, and vomited it 30 minutes later.  He stopped eating, continued to vomit some.  He was seen 9/11, vomited after drinking gatorade (after visit here), and he was seen in the ER.  No further vomiting from Friday night 9/11 until last night.  He had felt week, but began eating bland diet. He had advanced his diet more, and he ate burger and fries yesterday, felt fine.  Had chinese soup for dinner.  He was in [place] 9/14 until this morning. At 1 am this morning he  Vomited.  He didn't have a chair in hotel room--as soon as he came back from dinner, went into bed.  He has had heartburn in the past, not recently.  He had burning of the esophagus during the week that he was vomitng violently, but that resolved.  Denies any abdominal pain.  Denies fever. (max was 98.9 on 9/10).  He presents to discuss the CT results, whether is vomiting is related to the abnormality seen in the pancreas, and what the next step is.  He is going out of the country ([place]) for 2 weeks soon.  CT scan done 9/11:  No evidence of acute abnormality.  2 cm indeterminate lesion within the pancreatic head/ uncinate  process - likely a cystic lesion. MRI with and without contrast is  recommended. Non-emergent MRI should  be deferred until patient has  been discharged for the acute illness, and can optimally cooperate  with positioning and breath-holding instructions.  Colonic diverticulosis without diverticulitis.  Small left inguinal hernia containing fat.  Mild fatty infiltration liver, cardiomegaly a prostate enlargement.  Past Medical History  Diagnosis Date  . Hemorrhoids   . Gout   . Right rotator cuff tear   . Hypertension     elevated BP.  Marland Kitchen Partial tear of Achilles tendon summer 2005    left  . Premature ejaculation   . ED (erectile dysfunction)   . Prostate cancer     surveillance; Dr. Diona Fanti   Past Surgical History  Procedure Laterality Date  . Pyloric stenosis surgery  12 weeks old.  . Tonsillectomy    . Vasectomy    . Rotator cuff repair      right; Dr. Rhona Raider   History   Social History  . Marital Status: Married    Spouse Name: N/A    Number of Children: 2  . Years of Education: N/A   Occupational History  . leadership training    Social History Main Topics  . Smoking status: Former Smoker    Types: Pipe  . Smokeless tobacco: Never Used     Comment: quit smoking pipe in the 80's  . Alcohol Use: Yes     Comment:  8-10 drinks per week.  . Drug Use: No  . Sexual Activity: Yes    Partners: Female   Other Topics Concern  . Not on file   Social History Narrative   Lives with wife, stepson and dog.  Son died at 68.  58 year old stepson.  Daughter in [place], Son in [place].  Luz [employer] a lot with his job   Outpatient Encounter Prescriptions as of 11/11/2013  Medication Sig  . allopurinol (ZYLOPRIM) 300 MG tablet take 1 tablet by mouth once daily  . lisinopril-hydrochlorothiazide (PRINZIDE,ZESTORETIC) 20-25 MG per tablet take 1 tablet by mouth once daily  . sildenafil (VIAGRA) 100 MG tablet take 1/2 to 1 tablet by mouth once daily if needed for ERECTILE DYSFUNCTION  . tamsulosin (FLOMAX) 0.4 MG CAPS capsule Take 0.4 mg by mouth daily.  Marland Kitchen esomeprazole (NEXIUM) 40 MG  capsule Take 1 capsule (40 mg total) by mouth daily.  . ondansetron (ZOFRAN-ODT) 8 MG disintegrating tablet Take 1 tablet (8 mg total) by mouth every 8 (eight) hours as needed for nausea or vomiting.  . promethazine (PHENERGAN) 25 MG suppository Place 1 suppository (25 mg total) rectally every 6 (six) hours as needed for nausea or vomiting.  . [DISCONTINUED] esomeprazole (NEXIUM) 40 MG capsule Take 1 capsule (40 mg total) by mouth daily.  . [DISCONTINUED] ondansetron (ZOFRAN ODT) 8 MG disintegrating tablet Take 1 tablet (8 mg total) by mouth every 8 (eight) hours as needed for nausea or vomiting.   (nexium given at today's visit)  No Known Allergies  ROS:  No fevers, chills, headaches, dizziness.  No bleeding, bruising, rash. No chest pain, palpitations, shortness of breath.  Currently denies any nausea, abdominal pain.  No further diarrhea (just the one episode prior to onset of vomiting).  No urinary complaints. See HPI.  PHYSICAL EXAM: BP 100/68  Pulse 72  Temp(Src) 97.8 F (36.6 C) (Tympanic)  Ht 6\' 1"  (1.854 m)  Wt 216 lb (97.977 kg)  BMI 28.50 kg/m2 Well developed, pleasant male, accompanied by his wife, in no discomfort or distress Neck: no lymphadenopathy, thyromegaly or mass Heart: regular rate and rhythm without  Murmur Lungs: clear bilaterally Abdomen: soft, nontender, no organomegaly or mass.  WHSS. Normal bowel sounds Extremities: no edema Psych: normal mood, affect, hygiene and grooming Neuro: alert and oriented, normal strength, sensation, gait  ASSESSMENT/PLAN:  Abnormal finding on CT scan  Non-intractable vomiting without nausea, vomiting of unspecified type - intractable vomiting resolved.  Last night occurred just once--suspect due to reflex  Suspect prior vomiting related to acute illness/virus  Gastroesophageal reflux disease without esophagitis - counseled re: PPI use (take before dinner) and proper diet/behavioral changes - Plan: esomeprazole (NEXIUM) 40 MG  capsule, DISCONTINUED: esomeprazole (NEXIUM) 40 MG capsule  Diet reviewed in detail.  Small frequent meals, elevate HOB.  Take nexium prior to dinner. After returning from trip, if no further GI problems, try cutting back to OTC dose. He has zofran ODT to use prn, and to take on trip.  Abnormal CT--he is already scheduled to have MRI.  Discussed at length the potential findings (cystic vs solid) and the plan (wanting tissue dx if solid; likely will recommend imaging/monitoring for f/u if cystic). Even if needs biopsy, this would not be urgent--can be done upon return from his trip (unless he develops pain, painless jaundice, or other symptoms)---this is incidental finding, and rushing to get diagnosis would not be recommended (potential risk for post-procedure complications while abroad, etc).  All questions were answered.   >  30 min visit, more than 1/2 spent counseling.

## 2013-11-11 NOTE — Patient Instructions (Signed)
Take the Nexium samples provided once daily prior to dinner. Once you get back from you rtrip, you can try cutting back on the dose to the 20mg  OTC Nexium.  If symptoms recur, then call for refill of prescription.  Food Choices for Gastroesophageal Reflux Disease When you have gastroesophageal reflux disease (GERD), the foods you eat and your eating habits are very important. Choosing the right foods can help ease the discomfort of GERD. WHAT GENERAL GUIDELINES DO I NEED TO FOLLOW?  Choose fruits, vegetables, whole grains, low-fat dairy products, and low-fat meat, fish, and poultry.  Limit fats such as oils, salad dressings, butter, nuts, and avocado.  Keep a food diary to identify foods that cause symptoms.  Avoid foods that cause reflux. These may be different for different people.  Eat frequent small meals instead of three large meals each day.  Eat your meals slowly, in a relaxed setting.  Limit fried foods.  Cook foods using methods other than frying.  Avoid drinking alcohol.  Avoid drinking large amounts of liquids with your meals.  Avoid bending over or lying down until 2-3 hours after eating. WHAT FOODS ARE NOT RECOMMENDED? The following are some foods and drinks that may worsen your symptoms: Vegetables Tomatoes. Tomato juice. Tomato and spaghetti sauce. Chili peppers. Onion and garlic. Horseradish. Fruits Oranges, grapefruit, and lemon (fruit and juice). Meats High-fat meats, fish, and poultry. This includes hot dogs, ribs, ham, sausage, salami, and bacon. Dairy Whole milk and chocolate milk. Sour cream. Cream. Butter. Ice cream. Cream cheese.  Beverages Coffee and tea, with or without caffeine. Carbonated beverages or energy drinks. Condiments Hot sauce. Barbecue sauce.  Sweets/Desserts Chocolate and cocoa. Donuts. Peppermint and spearmint. Fats and Oils High-fat foods, including [place] fries and potato chips. Other Vinegar. Strong spices, such as black  pepper, white pepper, red pepper, cayenne, curry powder, cloves, ginger, and chili powder. The items listed above may not be a complete list of foods and beverages to avoid. Contact your dietitian for more information. Document Released: 02/11/2005 Document Revised: 02/16/2013 Document Reviewed: 12/16/2012 [facility] Patient Information 2015 [place], [place]. This information is not intended to replace advice given to you by your health care provider. Make sure you discuss any questions you have with your health care provider.  Gastroesophageal Reflux Disease, Adult Gastroesophageal reflux disease (GERD) happens when acid from your stomach flows up into the esophagus. When acid comes in contact with the esophagus, the acid causes soreness (inflammation) in the esophagus. Over time, GERD may create small holes (ulcers) in the lining of the esophagus. CAUSES   Increased body weight. This puts pressure on the stomach, making acid rise from the stomach into the esophagus.  Smoking. This increases acid production in the stomach.  Drinking alcohol. This causes decreased pressure in the lower esophageal sphincter (valve or ring of muscle between the esophagus and stomach), allowing acid from the stomach into the esophagus.  Late evening meals and a full stomach. This increases pressure and acid production in the stomach.  A malformed lower esophageal sphincter. Sometimes, no cause is found. SYMPTOMS   Burning pain in the lower part of the mid-chest behind the breastbone and in the mid-stomach area. This may occur twice a week or more often.  Trouble swallowing.  Sore throat.  Dry cough.  Asthma-like symptoms including chest tightness, shortness of breath, or wheezing. DIAGNOSIS  Your caregiver may be able to diagnose GERD based on your symptoms. In some cases, X-rays and other tests may be done  to check for complications or to check the condition of your stomach and esophagus. TREATMENT  Your  caregiver may recommend over-the-counter or prescription medicines to help decrease acid production. Ask your caregiver before starting or adding any new medicines.  HOME CARE INSTRUCTIONS   Change the factors that you can control. Ask your caregiver for guidance concerning weight loss, quitting smoking, and alcohol consumption.  Avoid foods and drinks that make your symptoms worse, such as:  Caffeine or alcoholic drinks.  Chocolate.  Peppermint or mint flavorings.  Garlic and onions.  Spicy foods.  Citrus fruits, such as oranges, lemons, or limes.  Tomato-based foods such as sauce, chili, salsa, and pizza.  Fried and fatty foods.  Avoid lying down for the 3 hours prior to your bedtime or prior to taking a nap.  Eat small, frequent meals instead of large meals.  Wear loose-fitting clothing. Do not wear anything tight around your waist that causes pressure on your stomach.  Raise the head of your bed 6 to 8 inches with wood blocks to help you sleep. Extra pillows will not help.  Only take over-the-counter or prescription medicines for pain, discomfort, or fever as directed by your caregiver.  Do not take aspirin, ibuprofen, or other nonsteroidal anti-inflammatory drugs (NSAIDs). SEEK IMMEDIATE MEDICAL CARE IF:   You have pain in your arms, neck, jaw, teeth, or back.  Your pain increases or changes in intensity or duration.  You develop nausea, vomiting, or sweating (diaphoresis).  You develop shortness of breath, or you faint.  Your vomit is green, yellow, black, or looks like coffee grounds or blood.  Your stool is red, bloody, or black. These symptoms could be signs of other problems, such as heart disease, gastric bleeding, or esophageal bleeding. MAKE SURE YOU:   Understand these instructions.  Will watch your condition.  Will get help right away if you are not doing well or get worse. Document Released: 11/21/2004 Document Revised: 05/06/2011 Document  Reviewed: 08/31/2010 [facility] Patient Information 2015 [place], [place]. This information is not intended to replace advice given to you by your health care provider. Make sure you discuss any questions you have with your health care provider.

## 2013-11-11 NOTE — Telephone Encounter (Signed)
Was waiting to call patient until 11:00am, when he said his flight would arrive in [place]. I noticed he scheduled an appt with Dr.Knapp this morning. Called him with Dr.Knapp's recommendations and he said that he would still like to come in and speak with her.

## 2013-11-12 ENCOUNTER — Telehealth: Payer: Self-pay | Admitting: Family Medicine

## 2013-11-12 ENCOUNTER — Ambulatory Visit
Admission: RE | Admit: 2013-11-12 | Discharge: 2013-11-12 | Disposition: A | Discharge status: Home / Self Care | Payer: Medicare Other | Source: Ambulatory Visit | Attending: Family Medicine | Admitting: Family Medicine

## 2013-11-12 ENCOUNTER — Encounter: Payer: Self-pay | Admitting: Family Medicine

## 2013-11-12 DIAGNOSIS — K869 Disease of pancreas, unspecified: Secondary | ICD-10-CM

## 2013-11-12 MED ORDER — GADOBENATE DIMEGLUMINE 529 MG/ML IV SOLN
20.0000 mL | Freq: Once | INTRAVENOUS | Status: AC | PRN
  Administered 2013-11-12: 20 mL via INTRAVENOUS

## 2013-11-12 NOTE — Telephone Encounter (Signed)
Noted.  Results not back yet--will be looking for them later.

## 2013-11-12 NOTE — Telephone Encounter (Signed)
Patient was able to get in today for MRI and he wanted to let you know

## 2013-11-13 ENCOUNTER — Telehealth: Payer: Self-pay | Admitting: Family Medicine

## 2013-11-13 DIAGNOSIS — K219 Gastro-esophageal reflux disease without esophagitis: Secondary | ICD-10-CM

## 2013-11-13 NOTE — Telephone Encounter (Signed)
Initiated P.A. Esomeprazole and it was denied due to this is a plan exclusion.  Covered alternatives are Omeprazole, Pantoprazole, Rabeprazole or Dexilant. Do you want to switch?

## 2013-11-14 ENCOUNTER — Inpatient Hospital Stay
Admission: RE | Admit: 2013-11-14 | Discharge: 2013-11-14 | Disposition: A | Discharge status: Home / Self Care | Payer: Medicare Other | Source: Ambulatory Visit | Attending: Family Medicine | Admitting: Family Medicine

## 2013-11-14 ENCOUNTER — Encounter: Payer: Self-pay | Admitting: Family Medicine

## 2013-11-14 DIAGNOSIS — K862 Cyst of pancreas: Secondary | ICD-10-CM | POA: Insufficient documentation

## 2013-11-14 MED ORDER — DEXLANSOPRAZOLE 60 MG PO CPDR: 60.0000 mg | DELAYED_RELEASE_CAPSULE | Freq: Every day | ORAL | Status: DC

## 2013-11-14 NOTE — Telephone Encounter (Signed)
Please change to Dexilant 60mg  once daily.  I sent new prescription to the pharmacy.  Please contact the patient letting him know if the change for insurance purposes (and letting him know that I actually prefer Dexilant--we didn't have samples when he was at the office).

## 2013-11-15 NOTE — Telephone Encounter (Signed)
Pt informed of change, he will let us know if this doesn't work

## 2013-11-18 ENCOUNTER — Other Ambulatory Visit: Payer: Self-pay | Admitting: *Deleted

## 2013-11-18 ENCOUNTER — Telehealth: Payer: Self-pay | Admitting: *Deleted

## 2013-11-18 MED ORDER — ZOLPIDEM TARTRATE 10 MG PO TABS: 10.0000 mg | ORAL_TABLET | Freq: Every evening | ORAL | Status: DC | PRN

## 2013-11-18 NOTE — Telephone Encounter (Signed)
Patient is in [place] and is leaving to go to [place] from there-forgot his Ambien. Would like to know if you could call in so he can pick up and take with him. [place] [phone]. Called pharmacy and [place] requires that we send written rx after phone in, so if ok this would need to be printed as well. Thanks. Spoke with [facility] @ pharmacy.

## 2013-11-18 NOTE — Telephone Encounter (Signed)
It doesn't look like we have prescribed this to him in a while. Is #20 (same amount as previously prescribed) enough for his trip?  Okay to phone in and print rx for me to sign to be mailed.

## 2013-11-18 NOTE — Telephone Encounter (Signed)
Called in #20, printed rx, will mail and I did let patient know also.

## 2013-12-01 ENCOUNTER — Telehealth: Payer: Self-pay | Admitting: *Deleted

## 2013-12-01 NOTE — Telephone Encounter (Signed)
Patients wife notified

## 2013-12-01 NOTE — Telephone Encounter (Signed)
Patient's wife called in and wanted me to let you know a few things. Patient was about 95% better before they left for [place] last week of September. While there on 11/23/13 patient began having symptoms of chills, diarrhea, fever, exhaustion and feeling worn out. Was seen at [facility] in [place] 11/25/13, told to stop immodium and was given some sort medication to balance electrolytes. He is still there and his wife is back home. The diarrhea has continued each day after eating any sort of foods, going right through him. Yesterday evening was the first he has felt somewhat better but is still having loose stools. No vomiting at all this time. He still feels just exhausted and worn out. Patient's wife is calling to see if whether or not you think this is all related to what has been going on in the recent past with his GI issues or maybe is this a separate thing, ie: food poisoning, virus? Jevaughn also has not taken any GERD medication in many days. He will be home late Thursday evening if he does need to be seen, but patient's wife does feel that you would know better about his condition. Please let me know what to tell them, thanks.

## 2013-12-01 NOTE — Telephone Encounter (Signed)
Spoke with patient's wife, Stanton Kidney and went over Dr.Knapp's response in detail. Patient's wife just wants to make sure that it would be okay for patient to take immodium, at least temporarily just to get him through his long flight from [place] tomorrow. Also when I told her to have him continue to avoid dairy, she states that he is except for yogurt for the probiotic aspect. Should he avoid the yogurt and perhaps take an acidophilus capsule instead? Please advise, thanks.

## 2013-12-01 NOTE — Telephone Encounter (Signed)
Doctor's response: This message needs to go under Mark Caldwell's chart, not his wife. Please change.  If he is having any nausea, vomiting, fever, chills, then likely is viral, rather than related to prior problems.  He was started on PPI for reflux symptoms. This has a known side effect of diarrhea. Definitely a good idea to stop/hold for now. Continue to avoid dairy (lactose-free diet)

## 2013-12-01 NOTE — Telephone Encounter (Signed)
Ok to use imodium for plane flight. Perhaps capsule (ie Align) would be a safer option

## 2014-01-23 ENCOUNTER — Other Ambulatory Visit: Payer: Self-pay | Admitting: Family Medicine

## 2014-01-24 NOTE — Telephone Encounter (Signed)
Is this okay to refill? 

## 2014-01-31 ENCOUNTER — Ambulatory Visit (INDEPENDENT_AMBULATORY_CARE_PROVIDER_SITE_OTHER): Payer: 59 | Admitting: Family Medicine

## 2014-01-31 ENCOUNTER — Encounter: Payer: Self-pay | Admitting: Family Medicine

## 2014-01-31 VITALS — BP 124/78 | HR 80 | Ht 71.75 in | Wt 218.0 lb

## 2014-01-31 DIAGNOSIS — I10 Essential (primary) hypertension: Secondary | ICD-10-CM

## 2014-01-31 DIAGNOSIS — N529 Male erectile dysfunction, unspecified: Secondary | ICD-10-CM

## 2014-01-31 DIAGNOSIS — K862 Cyst of pancreas: Secondary | ICD-10-CM

## 2014-01-31 DIAGNOSIS — M109 Gout, unspecified: Secondary | ICD-10-CM

## 2014-01-31 DIAGNOSIS — Z0000 Encounter for general adult medical examination without abnormal findings: Secondary | ICD-10-CM

## 2014-01-31 DIAGNOSIS — R938 Abnormal findings on diagnostic imaging of other specified body structures: Secondary | ICD-10-CM

## 2014-01-31 DIAGNOSIS — M10079 Idiopathic gout, unspecified ankle and foot: Secondary | ICD-10-CM

## 2014-01-31 DIAGNOSIS — R9389 Abnormal findings on diagnostic imaging of other specified body structures: Secondary | ICD-10-CM

## 2014-01-31 DIAGNOSIS — Z5181 Encounter for therapeutic drug level monitoring: Secondary | ICD-10-CM

## 2014-01-31 DIAGNOSIS — C61 Malignant neoplasm of prostate: Secondary | ICD-10-CM

## 2014-01-31 DIAGNOSIS — Z23 Encounter for immunization: Secondary | ICD-10-CM

## 2014-01-31 LAB — COMPREHENSIVE METABOLIC PANEL
ALT: 14 U/L (ref 0–53)
AST: 15 U/L (ref 0–37)
Albumin: 4.4 g/dL (ref 3.5–5.2)
Alkaline Phosphatase: 66 U/L (ref 39–117)
BUN: 23 mg/dL (ref 6–23)
CO2: 25 mEq/L (ref 19–32)
Calcium: 10 mg/dL (ref 8.4–10.5)
Chloride: 102 mEq/L (ref 96–112)
Creat: 0.92 mg/dL (ref 0.50–1.35)
Glucose, Bld: 103 mg/dL — ABNORMAL HIGH (ref 70–99)
Potassium: 4.5 mEq/L (ref 3.5–5.3)
Sodium: 135 mEq/L (ref 135–145)
Total Bilirubin: 0.5 mg/dL (ref 0.2–1.2)
Total Protein: 6.8 g/dL (ref 6.0–8.3)

## 2014-01-31 LAB — POCT URINALYSIS DIPSTICK
Bilirubin, UA: NEGATIVE
Blood, UA: NEGATIVE
Glucose, UA: NEGATIVE
Ketones, UA: NEGATIVE
Leukocytes, UA: NEGATIVE
Nitrite, UA: NEGATIVE
Protein, UA: NEGATIVE
Spec Grav, UA: >=1.03
Urobilinogen, UA: NEGATIVE
pH, UA: 5.5

## 2014-01-31 LAB — LIPID PANEL
Cholesterol: 182 mg/dL (ref 0–200)
HDL: 58 mg/dL (ref >39)
LDL Cholesterol: 109 mg/dL — ABNORMAL HIGH (ref 0–99)
Total CHOL/HDL Ratio: 3.1 Ratio
Triglycerides: 73 mg/dL (ref <150)
VLDL: 15 mg/dL (ref 0–40)

## 2014-01-31 LAB — CBC WITH DIFFERENTIAL/PLATELET
Basophils Absolute: 0.1 10*3/uL (ref 0.0–0.1)
Basophils Relative: 1 % (ref 0–1)
Eosinophils Absolute: 0.3 10*3/uL (ref 0.0–0.7)
Eosinophils Relative: 4 % (ref 0–5)
HCT: 45.6 % (ref 39.0–52.0)
Hemoglobin: 14.9 g/dL (ref 13.0–17.0)
Lymphocytes Relative: 27 % (ref 12–46)
Lymphs Abs: 1.9 10*3/uL (ref 0.7–4.0)
MCH: 30.2 pg (ref 26.0–34.0)
MCHC: 32.7 g/dL (ref 30.0–36.0)
MCV: 92.5 fL (ref 78.0–100.0)
MPV: 9.9 fL (ref 9.4–12.4)
Monocytes Absolute: 0.5 10*3/uL (ref 0.1–1.0)
Monocytes Relative: 7 % (ref 3–12)
Neutro Abs: 4.3 10*3/uL (ref 1.7–7.7)
Neutrophils Relative %: 61 % (ref 43–77)
Platelets: 267 10*3/uL (ref 150–400)
RBC: 4.93 MIL/uL (ref 4.22–5.81)
RDW: 14.9 % (ref 11.5–15.5)
WBC: 7 10*3/uL (ref 4.0–10.5)

## 2014-01-31 LAB — TSH: TSH: 0.91 u[IU]/mL (ref 0.350–4.500)

## 2014-01-31 LAB — URIC ACID: Uric Acid, Serum: 6.3 mg/dL (ref 4.0–7.8)

## 2014-01-31 MED ORDER — LISINOPRIL-HYDROCHLOROTHIAZIDE 20-25 MG PO TABS: ORAL_TABLET | ORAL | Status: DC

## 2014-01-31 MED ORDER — ALLOPURINOL 300 MG PO TABS: ORAL_TABLET | ORAL | Status: DC

## 2014-01-31 NOTE — Progress Notes (Signed)
Chief Complaint  Patient presents with  . Annual Exam    nonfasting annual exam(blood is in lab). Just had eye exam last month with Dr.Parker @ [facility]. No concerns.    He presents for a complete physical and med check, without any specific complaints.   He is asking about taking a baby aspirin daily--he bought it and plans to start.  GI illness (vomiting, followed by diarrhea) in September/October--all symptoms have resolved.  He hasn't needed to take any Dexilant.  He was noted to have cystic lesion in pancreas during CT (f/b MRI) as part of evaluation of GI symptoms/vomiting, back in September. Repeat in 10/2014 is recommended.  He is past due to see urologist due to his travels, and appointments having to be canceled.  He would like [place] to do PSA today, but will have urologist do the rectal exam.  He sees Dr. Diona Fanti.  He is not getting treatment, just surveillance at this time.  Gout--no flares in the last year. Compliant with his medication.  HTN--115/85 at home. Denies headaches, dizziness, edema, chest pain. He has lost some weight.  Exercise is limited, but does a lot of walking when traveling. ED-- using Viagra prn with good results.   Immunization History  Administered Date(s) Administered  . Hepatitis A 01/27/2006  . Influenza Split 01/10/2009, 01/03/2011, 01/06/2012  . Influenza, High Dose Seasonal PF 02/11/2013, 01/31/2014  . Pneumococcal Conjugate-13 02/11/2013  . Tdap 02/22/2009  . Zoster 01/25/2006   Last colonoscopy: 2011, normal per patient  Last PSA: monitored by Dr. Beatrix Fetters, requesting today. Dentist: yearly  Ophtho: yearly  Exercise: Walks a lot with his traveling. Occasionally exercises in the basement, not regularly.    Past Medical History  Diagnosis Date  . Hemorrhoids   . Gout   . Right rotator cuff tear   . Hypertension     elevated BP.  Marland Kitchen Partial tear of Achilles tendon summer 2005    left  . Premature ejaculation   . ED  (erectile dysfunction)   . Prostate cancer     surveillance; Dr. Diona Fanti    Past Surgical History  Procedure Laterality Date  . Pyloric stenosis surgery  78 weeks old.  . Tonsillectomy    . Vasectomy    . Rotator cuff repair Right     Dr. Rhona Raider    History   Social History  . Marital Status: Married    Spouse Name: N/A    Number of Children: 2  . Years of Education: N/A   Occupational History  . leadership training    Social History Main Topics  . Smoking status: Former Smoker    Types: Pipe  . Smokeless tobacco: Never Used     Comment: quit smoking pipe in the 80's  . Alcohol Use: 0.0 oz/week    0 Not specified per week     Comment: 8-10 drinks per week (less when home, same when traveling)  . Drug Use: No  . Sexual Activity:    Partners: Female   Other Topics Concern  . Not on file   Social History Narrative   Lives with wife, stepson and dog.  Son died at 65.  57 year old stepson (studying in [place] this semester).  Daughter in [place] (has 1 daughter), Son in [place] (has 3 children).  Travels a lot with his job    Family History  Problem Relation Age of Onset  . Asthma Mother   . Osteoporosis Mother   . Leukemia  Father   . Hyperlipidemia Father     increased chol  . Coronary artery disease Father 79    CABG  . Cancer Father     leukemia  . Down syndrome Brother   . Deep vein thrombosis Brother 20  . Gout Brother   . Dementia Brother   . Hodgkin's lymphoma Son   . Gout Son   . Cancer Maternal Grandfather     pancreatic cancer (and lung?/smoker)  . Diabetes Neg Hx     Outpatient Encounter Prescriptions as of 01/31/2014  Medication Sig Note  . allopurinol (ZYLOPRIM) 300 MG tablet take 1 tablet by mouth once daily   . lisinopril-hydrochlorothiazide (PRINZIDE,ZESTORETIC) 20-25 MG per tablet take 1 tablet by mouth once daily   . tamsulosin (FLOMAX) 0.4 MG CAPS capsule Take 0.4 mg by mouth daily.   Marland Kitchen VIAGRA 100 MG tablet take 1/2 to 1 tablet by mouth  once daily if needed for ERECTILE DYSFUNCTION   . dexlansoprazole (DEXILANT) 60 MG capsule Take 1 capsule (60 mg total) by mouth daily before supper. (Patient not taking: Reported on 01/31/2014) 01/31/2014: Has not needed; has it on hand for prn use  . zolpidem (AMBIEN) 10 MG tablet Take 1 tablet (10 mg total) by mouth at bedtime as needed for sleep. (Patient not taking: Reported on 01/31/2014) 01/31/2014: Just for overseas travel  . [DISCONTINUED] ondansetron (ZOFRAN-ODT) 8 MG disintegrating tablet Take 1 tablet (8 mg total) by mouth every 8 (eight) hours as needed for nausea or vomiting.   . [DISCONTINUED] promethazine (PHENERGAN) 25 MG suppository Place 1 suppository (25 mg total) rectally every 6 (six) hours as needed for nausea or vomiting.     No Known Allergies  ROS: The patient denies anorexia, fever, headaches, vision loss, decreased hearing, ear pain, hoarseness, chest pain, palpitations, dizziness, syncope, dyspnea on exertion, cough, swelling, nausea, vomiting, diarrhea, constipation, abdominal pain, melena, hematochezia, hematuria, incontinence, nocturia, weakened urine stream, dysuria, genital lesions, joint pains, numbness, tingling, weakness, tremor, depression, anxiety, abnormal bleeding/bruising, or enlarged lymph nodes  Premature ejaculation (and mild ED)-improved/controlled with Viagra.  Rare occasional heartburn; no further diarrhea. No abdominal pain 10# weight loss in the last year, intentional  PHYSICAL EXAM: BP 140/74 mmHg  Pulse 80  Ht 5' 11.75" (1.822 m)  Wt 218 lb (98.884 kg)  BMI 29.79 kg/m2 124/78 on repeat by MD  General Appearance:  Alert, cooperative, no distress, appears stated age   Head:  Normocephalic, without obvious abnormality, atraumatic   Eyes:  PERRL, conjunctiva/corneas clear, EOM's intact, fundi  Benign. 68m flesh colored mole on right lower eye lid, in center (unchanged per pt)  Ears:  Normal TM's. anteriorly there is a small white growth in  R EAC; no erythema/inflammation.  Some dried wax bilaterally  Nose:  Nares normal, mucosa normal, no drainage or sinus tenderness   Throat:  Lips, mucosa, and tongue normal; teeth and gums normal   Neck:  Supple, no lymphadenopathy; thyroid: no enlargement/tenderness/nodules; no carotid  bruit or JVD   Back:  Spine nontender, no curvature, ROM normal, no CVA tenderness   Lungs:  Clear to auscultation bilaterally without wheezes, rales or ronchi; respirations unlabored   Chest Wall:  No tenderness or deformity   Heart:  Regular rate and rhythm, S1 and S2 normal, no murmur, rub  or gallop   Breast Exam:  No chest wall tenderness, masses or gynecomastia   Abdomen:  Soft, non-tender, nondistended, normoactive bowel sounds,  no masses, no hepatosplenomegaly. +abdominal obesity  Genitalia:  Testicles descended bilaterally without masses. No inguinal hernias, no skin lesions  Rectal:  Deferred to urology   Extremities:  No clubbing, cyanosis or edema   Pulses:  2+ and symmetric all extremities   Skin:  Skin color, texture, turgor normal, no rashes or lesions. Angiomas on chest/abdomen. Mobile, nontender subcutaneous mass on his right mid back.  Lymph nodes:  Cervical, supraclavicular, and axillary nodes normal   Neurologic:  CNII-XII intact, normal strength, sensation and gait; reflexes 2+ and symmetric throughout    Psych: Normal mood, affect, hygiene and grooming.   Small LIH (noted on CT 10/2013)--NOT palpable on exam today.  MRI 10/2013 IMPRESSION: 1.8 cm complex cystic lesion in the pancreatic uncinate process, most likely representing a benign cystic neoplasm such as a serous cystadenoma. Followup by MRI recommended in 12 months. This recommendation follows ACR consensus guidelines: Managing Incidental Findings on Abdominal CT: White Paper of the ACR Incidental Findings Committee. J Am Coll Radiol 2010;7:754-773  Focal area  of wall thickening in the gallbladder fundus, which could represent a gallbladder polyp or adenomyomatosis. Ultrasound is recommended for further evaluation of this finding.  ASSESSMENT/PLAN:  Annual physical exam - Plan: POCT Urinalysis Dipstick, Lipid panel, Comprehensive metabolic panel, CBC with Differential, TSH, PSA  Need for prophylactic vaccination and inoculation against influenza - Plan: Flu vaccine HIGH DOSE PF (Fluzone Tri High dose)  Medication monitoring encounter - Plan: Comprehensive metabolic panel, CBC with Differential, Uric acid  Essential hypertension, benign - controlled - Plan: Lipid panel, Comprehensive metabolic panel, lisinopril-hydrochlorothiazide (PRINZIDE,ZESTORETIC) 20-25 MG per tablet  Gout of foot, unspecified cause, unspecified chronicity, unspecified laterality - doing well on allopurinol with no flares - Plan: Uric acid, allopurinol (ZYLOPRIM) 300 MG tablet  Prostate cancer - continue surveillance with Dr. Diona Fanti.  Will forward results when back - Plan: PSA  Erectile dysfunction, unspecified erectile dysfunction type - controlled by Viagra prn  Abnormal finding on CT scan  Cyst of pancreas - plan repeat MRI in 10/2014  Essential hypertension, benign - controlled. Continue monitoring BP at home. Daily exercise, weight loss and low sodium diet encouraged.  Continue current meds - Plan: Lipid panel, Comprehensive metabolic panel, lisinopril-hydrochlorothiazide (PRINZIDE,ZESTORETIC) 20-25 MG per tablet  Immunization due - Plan: Pneumococcal polysaccharide vaccine 23-valent greater than or equal to 2yo subcutaneous/IM  Discussed PSA screening (risks/benefits), recommended at least 30 minutes of aerobic activity at least 5 days/week; proper sunscreen use reviewed; healthy diet and alcohol recommendations (less than or equal to 2 drinks/day) reviewed; regular seatbelt use; changing batteries in smoke detectors. Self-testicular exams. Immunization  recommendations discussed--high dose flu shot and pneumovax today.  Colonoscopy recommendations reviewed, UTD.   c-met, lipid, uric acid, CBC, TSH, PSA

## 2014-01-31 NOTE — Patient Instructions (Signed)

## 2014-02-01 LAB — PSA: PSA: 5.47 ng/mL — ABNORMAL HIGH (ref <=4.00)

## 2014-03-28 HISTORY — PX: RETINAL DETACHMENT SURGERY: SHX105

## 2014-07-03 ENCOUNTER — Other Ambulatory Visit: Payer: Self-pay | Admitting: Family Medicine

## 2014-09-26 ENCOUNTER — Other Ambulatory Visit: Payer: Self-pay | Admitting: Urology

## 2014-09-26 DIAGNOSIS — C61 Malignant neoplasm of prostate: Secondary | ICD-10-CM

## 2014-12-23 ENCOUNTER — Ambulatory Visit (HOSPITAL_COMMUNITY)
Admission: RE | Admit: 2014-12-23 | Discharge: 2014-12-23 | Disposition: A | Discharge status: Home / Self Care | Payer: 59 | Source: Ambulatory Visit | Attending: Urology | Admitting: Urology

## 2014-12-23 DIAGNOSIS — K4090 Unilateral inguinal hernia, without obstruction or gangrene, not specified as recurrent: Secondary | ICD-10-CM | POA: Insufficient documentation

## 2014-12-23 DIAGNOSIS — C61 Malignant neoplasm of prostate: Secondary | ICD-10-CM | POA: Diagnosis not present

## 2014-12-23 DIAGNOSIS — K5730 Diverticulosis of large intestine without perforation or abscess without bleeding: Secondary | ICD-10-CM | POA: Diagnosis not present

## 2014-12-23 DIAGNOSIS — N400 Enlarged prostate without lower urinary tract symptoms: Secondary | ICD-10-CM | POA: Insufficient documentation

## 2014-12-23 LAB — POCT I-STAT CREATININE: Creatinine, Ser: 0.9 mg/dL (ref 0.61–1.24)

## 2014-12-23 MED ORDER — GADOBENATE DIMEGLUMINE 529 MG/ML IV SOLN
20.0000 mL | Freq: Once | INTRAVENOUS | Status: AC | PRN
  Administered 2014-12-23: 20 mL via INTRAVENOUS

## 2015-01-05 ENCOUNTER — Encounter: Payer: Self-pay | Admitting: *Deleted

## 2015-01-12 ENCOUNTER — Encounter: Payer: Self-pay | Admitting: Family Medicine

## 2015-02-02 ENCOUNTER — Other Ambulatory Visit: Payer: Self-pay | Admitting: Family Medicine

## 2015-02-02 ENCOUNTER — Ambulatory Visit (INDEPENDENT_AMBULATORY_CARE_PROVIDER_SITE_OTHER): Payer: 59 | Admitting: Family Medicine

## 2015-02-02 ENCOUNTER — Encounter: Payer: Self-pay | Admitting: Family Medicine

## 2015-02-02 VITALS — BP 150/90 | HR 72 | Ht 72.0 in | Wt 232.0 lb

## 2015-02-02 DIAGNOSIS — K869 Disease of pancreas, unspecified: Secondary | ICD-10-CM | POA: Diagnosis not present

## 2015-02-02 DIAGNOSIS — Z5181 Encounter for therapeutic drug level monitoring: Secondary | ICD-10-CM

## 2015-02-02 DIAGNOSIS — N529 Male erectile dysfunction, unspecified: Secondary | ICD-10-CM | POA: Diagnosis not present

## 2015-02-02 DIAGNOSIS — K862 Cyst of pancreas: Secondary | ICD-10-CM | POA: Diagnosis not present

## 2015-02-02 DIAGNOSIS — Z0000 Encounter for general adult medical examination without abnormal findings: Secondary | ICD-10-CM

## 2015-02-02 DIAGNOSIS — R938 Abnormal findings on diagnostic imaging of other specified body structures: Secondary | ICD-10-CM | POA: Diagnosis not present

## 2015-02-02 DIAGNOSIS — C61 Malignant neoplasm of prostate: Secondary | ICD-10-CM

## 2015-02-02 DIAGNOSIS — I10 Essential (primary) hypertension: Secondary | ICD-10-CM | POA: Diagnosis not present

## 2015-02-02 DIAGNOSIS — Z1159 Encounter for screening for other viral diseases: Secondary | ICD-10-CM

## 2015-02-02 DIAGNOSIS — M10079 Idiopathic gout, unspecified ankle and foot: Secondary | ICD-10-CM | POA: Diagnosis not present

## 2015-02-02 DIAGNOSIS — R9389 Abnormal findings on diagnostic imaging of other specified body structures: Secondary | ICD-10-CM

## 2015-02-02 DIAGNOSIS — M109 Gout, unspecified: Secondary | ICD-10-CM

## 2015-02-02 DIAGNOSIS — Z23 Encounter for immunization: Secondary | ICD-10-CM

## 2015-02-02 LAB — LIPID PANEL
Cholesterol: 205 mg/dL — ABNORMAL HIGH (ref 125–200)
HDL: 59 mg/dL (ref >=40)
LDL Cholesterol: 120 mg/dL (ref <130)
Total CHOL/HDL Ratio: 3.5 Ratio (ref <=5.0)
Triglycerides: 131 mg/dL (ref <150)
VLDL: 26 mg/dL (ref <30)

## 2015-02-02 LAB — POCT URINALYSIS DIPSTICK
Bilirubin, UA: NEGATIVE
Blood, UA: NEGATIVE
Glucose, UA: NEGATIVE
Ketones, UA: NEGATIVE
Leukocytes, UA: NEGATIVE
Nitrite, UA: NEGATIVE
Protein, UA: NEGATIVE
Spec Grav, UA: 1.025
Urobilinogen, UA: NEGATIVE
pH, UA: 6

## 2015-02-02 LAB — CBC WITH DIFFERENTIAL/PLATELET
Basophils Absolute: 0.1 10*3/uL (ref 0.0–0.1)
Basophils Relative: 1 % (ref 0–1)
Eosinophils Absolute: 0.3 10*3/uL (ref 0.0–0.7)
Eosinophils Relative: 4 % (ref 0–5)
HCT: 46.6 % (ref 39.0–52.0)
Hemoglobin: 15.4 g/dL (ref 13.0–17.0)
Lymphocytes Relative: 28 % (ref 12–46)
Lymphs Abs: 1.8 10*3/uL (ref 0.7–4.0)
MCH: 30.3 pg (ref 26.0–34.0)
MCHC: 33 g/dL (ref 30.0–36.0)
MCV: 91.7 fL (ref 78.0–100.0)
MPV: 10.3 fL (ref 8.6–12.4)
Monocytes Absolute: 0.6 10*3/uL (ref 0.1–1.0)
Monocytes Relative: 9 % (ref 3–12)
Neutro Abs: 3.7 10*3/uL (ref 1.7–7.7)
Neutrophils Relative %: 58 % (ref 43–77)
Platelets: 276 10*3/uL (ref 150–400)
RBC: 5.08 MIL/uL (ref 4.22–5.81)
RDW: 14.1 % (ref 11.5–15.5)
WBC: 6.3 10*3/uL (ref 4.0–10.5)

## 2015-02-02 LAB — COMPREHENSIVE METABOLIC PANEL
ALT: 30 U/L (ref 9–46)
AST: 22 U/L (ref 10–35)
Albumin: 4.4 g/dL (ref 3.6–5.1)
Alkaline Phosphatase: 65 U/L (ref 40–115)
BUN: 27 mg/dL — ABNORMAL HIGH (ref 7–25)
CO2: 29 mmol/L (ref 20–31)
Calcium: 10.1 mg/dL (ref 8.6–10.3)
Chloride: 100 mmol/L (ref 98–110)
Creat: 0.83 mg/dL (ref 0.70–1.25)
Glucose, Bld: 100 mg/dL — ABNORMAL HIGH (ref 65–99)
Potassium: 4.5 mmol/L (ref 3.5–5.3)
Sodium: 138 mmol/L (ref 135–146)
Total Bilirubin: 0.6 mg/dL (ref 0.2–1.2)
Total Protein: 7.5 g/dL (ref 6.1–8.1)

## 2015-02-02 LAB — URIC ACID: Uric Acid, Serum: 6 mg/dL (ref 4.0–7.8)

## 2015-02-02 LAB — TSH: TSH: 1.068 u[IU]/mL (ref 0.350–4.500)

## 2015-02-02 MED ORDER — TADALAFIL 20 MG PO TABS: 10.0000 mg | ORAL_TABLET | ORAL | Status: DC | PRN

## 2015-02-02 MED ORDER — ALLOPURINOL 300 MG PO TABS: ORAL_TABLET | ORAL | Status: DC

## 2015-02-02 MED ORDER — LISINOPRIL-HYDROCHLOROTHIAZIDE 20-25 MG PO TABS: ORAL_TABLET | ORAL | Status: DC

## 2015-02-02 NOTE — Progress Notes (Signed)
Chief Complaint  Patient presents with  . Annual Exam    fasting annual exam. Did not do eye exam as he recently had one at [facility] with Dr. Sandre Kitty. Has mentioned that his erections are weaker this year. Also asking to have an EKG done today.    Mark Caldwell is a 69 y.o. male who presents for a complete physical.  He has the following concerns:  He was noted to have cystic lesion in pancreas during CT (f/b MRI) as part of evaluation of GI symptoms/vomiting, back in September 2015. Repeat was recommended for 10/2014. This has not been scheduled yet.  He hasn't had any GI complaints.  Prostate cancer--he is under the care of Dr. Diona Fanti.  He had biopsies repeated last month.  2 of the biopsies showed Gleason 3+3=6 adenocarcinoma.  He had MRI of the prostate pre-biopsy (we don't have notes/results, just the path report).  He is continuing with surveillance.  He recalls PSA's being in the 5-6 range.  Gout--no flares in the last year. Compliant with his medication.  HTN--124-140/70-82, mostly 130/75 range at home. Denies headaches, dizziness, edema, chest pain. Exercise is limited, but does a lot of walking when traveling.  He is planning to start a more vigorous exercise routine and would like EKG done today.  He thought he hadn't had an EKG in many years--pointed out to him that one was last done 2 years ago (age 60).  He, therefore, declined having another.  He is not having any chest pain, palpitations, shortness of breath or any concerns.  ED-- using Viagra prn. It doesn't seem to work as well as in the past.  He has also used Cialis in the past.  Has never tried Licensed conveyancer. He thinks maybe the Viagra just isn't working as well, and would like to change it up with trial of a different medication.  Immunization History  Administered Date(s) Administered  . Hepatitis A 01/27/2006  . Influenza Split 01/10/2009, 01/03/2011, 01/06/2012  . Influenza, High Dose Seasonal PF 02/11/2013,  01/31/2014  . Pneumococcal Conjugate-13 02/11/2013  . Pneumococcal Polysaccharide-23 01/31/2014  . Tdap 02/22/2009  . Zoster 01/25/2006   Last colonoscopy: 2011, normal per patient  Last PSA: monitored by Dr. Beatrix Fetters Dentist: yearly  Ophtho: yearly (extra this year due to retinal detachment). Derm: sees Dr. Delman Cheadle twice yearly Exercise: Walks a lot with his traveling. No regular exercise program  Past Medical History  Diagnosis Date  . Hemorrhoids   . Gout   . Right rotator cuff tear   . Hypertension     elevated BP.  Marland Kitchen Partial tear of Achilles tendon summer 2005    left  . Premature ejaculation   . ED (erectile dysfunction)   . Left inguinal hernia     noted on CT 10/2013  . Prostate cancer ([place])     surveillance; Dr. Dahlstedt:12-30-14 prostate CA on 2 biopsies,Gleason for both 3+3=^     Past Surgical History  Procedure Laterality Date  . Pyloric stenosis surgery  52 weeks old.  . Tonsillectomy    . Vasectomy    . Rotator cuff repair Right     Dr. Rhona Raider  . Retinal detachment surgery  03/2014    "tacked it"    Social History   Social History  . Marital Status: Married    Spouse Name: N/A  . Number of Children: 2  . Years of Education: N/A   Occupational History  . leadership training    Social  History Main Topics  . Smoking status: Former Smoker    Types: Pipe  . Smokeless tobacco: Never Used     Comment: quit smoking pipe in the 80's  . Alcohol Use: 0.0 oz/week    0 Standard drinks or equivalent per week     Comment: 8-10 drinks per week (less when home, same when traveling)  . Drug Use: No  . Sexual Activity:    Partners: Female   Other Topics Concern  . Not on file   Social History Narrative   Lives with wife, stepson Medical illustrator at [employer]) and dog.  Son died at 24. Daughter in [place] (has 1 daughter), Son in [place] (has 3 children).  Travels a lot with his job--home only 3 days/week    Family History  Problem Relation Age of Onset  . Asthma  Mother   . Osteoporosis Mother   . Leukemia Father   . Hyperlipidemia Father     increased chol  . Coronary artery disease Father 53    CABG  . Cancer Father     leukemia  . Down syndrome Brother   . Deep vein thrombosis Brother 20  . Gout Brother   . Dementia Brother   . Hodgkin's lymphoma Son   . Gout Son   . Cancer Maternal Grandfather     pancreatic cancer (and lung?/smoker)  . Diabetes Neg Hx     Outpatient Encounter Prescriptions as of 02/02/2015  Medication Sig Note  . allopurinol (ZYLOPRIM) 300 MG tablet take 1 tablet by mouth once daily   . B Complex-C (SUPER B COMPLEX PO) Take 1 tablet by mouth daily.   . Cholecalciferol (VITAMIN D) 2000 UNITS tablet Take 2,000 Units by mouth daily. 02/02/2015: Takes about 3x/week  . DEXILANT 60 MG capsule TAKE 1 CAPSULE BY MOUTH DAILY BEFORE SUPPER 02/02/2015: Takes about once a month.  . lisinopril-hydrochlorothiazide (PRINZIDE,ZESTORETIC) 20-25 MG per tablet take 1 tablet by mouth once daily   . Multiple Vitamins-Minerals (CENTRUM ADULTS PO) Take 1 tablet by mouth daily. 02/02/2015: Remembers about 3-4x/wk  . tamsulosin (FLOMAX) 0.4 MG CAPS capsule Take 0.4 mg by mouth daily.   Marland Kitchen VIAGRA 100 MG tablet take 1/2 to 1 tablet by mouth once daily if needed for ERECTILE DYSFUNCTION   . zolpidem (AMBIEN) 10 MG tablet Take 1 tablet (10 mg total) by mouth at bedtime as needed for sleep. (Patient not taking: Reported on 01/31/2014) 01/31/2014: Just for overseas travel   No facility-administered encounter medications on file as of 02/02/2015.   No Known Allergies  ROS: The patient denies anorexia, fever, headaches, vision loss, decreased hearing, ear pain, hoarseness, chest pain, palpitations, dizziness, syncope, dyspnea on exertion, cough, swelling, nausea, vomiting, diarrhea, constipation, abdominal pain, melena, hematochezia, hematuria, incontinence, nocturia, weakened urine stream, dysuria, genital lesions, joint pains, numbness, tingling, weakness,  tremor, depression, anxiety, abnormal bleeding/bruising, or enlarged lymph nodes  +ED +weight gain (up 14#--regained the weight he had lost last year, plus)  PHYSICAL EXAM:  BP 150/90 mmHg  Pulse 72  Ht 6' (1.829 m)  Wt 232 lb (105.235 kg)  BMI 31.46 kg/m2 (unchanged on repeat BP) General Appearance:  Alert, cooperative, no distress, appears stated age   Head:  Normocephalic, without obvious abnormality, atraumatic   Eyes:  PERRL, conjunctiva/corneas clear, EOM's intact, fundi  Benign. 16m flesh colored mole on right lower eye lid, in center (unchanged per pt)  Ears:  Normal TM's and EAC's  Nose:  Nares normal, mucosa normal, no drainage or sinus  tenderness   Throat:  Lips, mucosa, and tongue normal; teeth and gums normal   Neck:  Supple, no lymphadenopathy; thyroid: no enlargement/tenderness/nodules; no carotid  bruit or JVD   Back:  Spine nontender, no curvature, ROM normal, no CVA tenderness   Lungs:  Clear to auscultation bilaterally without wheezes, rales or ronchi; respirations unlabored   Chest Wall:  No tenderness or deformity   Heart:  Regular rate and rhythm, S1 and S2 normal, no murmur, rub  or gallop   Breast Exam:  No chest wall tenderness, masses or gynecomastia   Abdomen:  Soft, non-tender, nondistended, normoactive bowel sounds,  no masses, no hepatosplenomegaly. +abdominal obesity   Genitalia:  Deferred to urology  Rectal:  Deferred to urology   Extremities:  No clubbing, cyanosis or edema   Pulses:  2+ and symmetric all extremities   Skin:  Skin color, texture, turgor normal, no rashes or lesions. Angiomas on chest/abdomen.   Lymph nodes:  Cervical, supraclavicular, and axillary nodes normal   Neurologic:  CNII-XII intact, normal strength, sensation and gait; reflexes 2+ and symmetric throughout    Psych:   Normal mood, affect, hygiene and grooming.           MRI 10/2013 IMPRESSION: 1.8 cm complex cystic lesion in the pancreatic uncinate process, most likely representing a benign cystic neoplasm such as a serous cystadenoma. Followup by MRI recommended in 12 months. This recommendation follows ACR consensus guidelines: Managing Incidental Findings on Abdominal CT: White Paper of the ACR Incidental Findings Committee. J Am Coll Radiol 2010;7:754-773  Focal area of wall thickening in the gallbladder fundus, which could represent a gallbladder polyp or adenomyomatosis. Ultrasound is recommended for further evaluation of this finding.  ASSESSMENT/PLAN:  Annual physical exam - Plan: Comprehensive metabolic panel, Lipid panel, CBC with Differential/Platelet, TSH, VITAMIN D 25 Hydroxy (Vit-D Deficiency, Fractures), Hepatitis C antibody, Uric Acid, POCT Urinalysis Dipstick  Need for prophylactic vaccination and inoculation against influenza - Plan: Flu vaccine HIGH DOSE PF (Fluzone High dose)  Essential hypertension, benign - elevated today, normal at home. Cont to monitor; low sodium diet; daily exercise and weight loss.  f/u if persistently high - Plan: lisinopril-hydrochlorothiazide (PRINZIDE,ZESTORETIC) 20-25 MG tablet  Gout of foot, unspecified cause, unspecified chronicity, unspecified laterality - doing well on allopurinol without flares - Plan: Uric Acid, allopurinol (ZYLOPRIM) 300 MG tablet  Erectile dysfunction, unspecified erectile dysfunction type - samples and savings card for Cialis given. Consider Levitra trial if ineffective - Plan: tadalafil (CIALIS) 20 MG tablet  Prostate cancer ([place]) - stable; continue surveillance with urology  Abnormal finding on CT scan - due for repeat MRI  Cyst of pancreas  Medication monitoring encounter - Plan: Comprehensive metabolic panel, CBC with Differential/Platelet, VITAMIN D 25 Hydroxy (Vit-D Deficiency, Fractures), Uric Acid  Need for hepatitis C screening test - Plan: Hepatitis C  antibody  Pancreatic lesion - Plan: MR Abdomen W Wo Contrast   Recommended at least 30 minutes of aerobic activity at least 5 days/week, weight-bearing exercise 2x/wk; proper sunscreen use reviewed; healthy diet and alcohol recommendations (less than or equal to 2 drinks/day) reviewed; regular seatbelt use; changing batteries in smoke detectors. Self-testicular exams. Immunization recommendations discussed--flu shot given.  Colonoscopy recommendations reviewed, UTD.   Hep C Ab, Uric acid, c-met lipid tsh, cbc  After discussing EKG, with lack of symptoms and well controlled BP's (at home, on a regular basis, high today), he declined to have EKG today.  He will let us know if he has  any problems when starting an exercise program, and will then be referred for stress test.  Counseled extensively re: diet, exercise, portions, but finds with his job and travel that it is difficult to make any changes.  F/u 1 year, sooner prn if BP's remain high. He reports that home BP monitor has been verified

## 2015-02-02 NOTE — Patient Instructions (Signed)
  HEALTH MAINTENANCE RECOMMENDATIONS:  It is recommended that you get at least 30 minutes of aerobic exercise at least 5 days/week (for weight loss, you may need as much as 60-90 minutes). This can be any activity that gets your heart rate up. This can be divided in 10-15 minute intervals if needed, but try and build up your endurance at least once a week.  Weight bearing exercise is also recommended twice weekly.  Eat a healthy diet with lots of vegetables, fruits and fiber.  "Colorful" foods have a lot of vitamins (ie green vegetables, tomatoes, red peppers, etc).  Limit sweet tea, regular sodas and alcoholic beverages, all of which has a lot of calories and sugar.  Up to 2 alcoholic drinks daily may be beneficial for men (unless trying to lose weight, watch sugars).  Drink a lot of water.  Sunscreen of at least SPF 30 should be used on all sun-exposed parts of the skin when outside between the hours of 10 am and 4 pm (not just when at beach or pool, but even with exercise, golf, tennis, and yard work!)  Use a sunscreen that says "broad spectrum" so it covers both UVA and UVB rays, and make sure to reapply every 1-2 hours.  Remember to change the batteries in your smoke detectors when changing your clock times in the spring and fall.  Use your seat belt every time you are in a car, and please drive safely and not be distracted with cell phones and texting while driving.    We will be scheduling you for the follow up MRI of the abdomen to re-evaluate the pancreatic cyst.

## 2015-02-03 LAB — HEPATITIS C ANTIBODY: HCV Ab: NEGATIVE

## 2015-02-03 LAB — VITAMIN D 25 HYDROXY (VIT D DEFICIENCY, FRACTURES): Vit D, 25-Hydroxy: 28 ng/mL — ABNORMAL LOW (ref 30–100)

## 2015-02-22 ENCOUNTER — Ambulatory Visit
Admission: RE | Admit: 2015-02-22 | Discharge: 2015-02-22 | Disposition: A | Discharge status: Home / Self Care | Payer: 59 | Source: Ambulatory Visit | Attending: Family Medicine | Admitting: Family Medicine

## 2015-02-22 DIAGNOSIS — K869 Disease of pancreas, unspecified: Secondary | ICD-10-CM

## 2015-02-22 MED ORDER — GADOBENATE DIMEGLUMINE 529 MG/ML IV SOLN
20.0000 mL | Freq: Once | INTRAVENOUS | Status: AC | PRN
  Administered 2015-02-22: 20 mL via INTRAVENOUS

## 2015-03-06 ENCOUNTER — Telehealth: Payer: Self-pay | Admitting: Internal Medicine

## 2015-03-06 MED ORDER — SILDENAFIL CITRATE 100 MG PO TABS: ORAL_TABLET | ORAL | Status: DC

## 2015-03-06 NOTE — Telephone Encounter (Signed)
done

## 2015-03-06 NOTE — Telephone Encounter (Signed)
Refill request from [facility] for viagra 100mg  #15

## 2015-03-13 ENCOUNTER — Telehealth: Payer: Self-pay | Admitting: *Deleted

## 2015-03-13 DIAGNOSIS — N529 Male erectile dysfunction, unspecified: Secondary | ICD-10-CM

## 2015-03-13 MED ORDER — TADALAFIL 20 MG PO TABS: 10.0000 mg | ORAL_TABLET | ORAL | Status: DC | PRN

## 2015-03-13 NOTE — Telephone Encounter (Signed)
done

## 2015-03-13 NOTE — Telephone Encounter (Signed)
Patient called and said that he does like the Cialis better, given samples and co-pay card at last visit. He would like for an rx to be sent to his pharmacy. Thanks.

## 2015-10-05 ENCOUNTER — Encounter: Payer: Self-pay | Admitting: Family Medicine

## 2016-02-04 NOTE — Progress Notes (Signed)
Chief Complaint  Patient presents with  . Annual Exam    fasting annual exam. Did not do eye exam as he just had one. Blood pressure has been elevated over the last 6 months.     Mark Caldwell is a 70 y.o. male who presents for a complete physical.  He has the following concerns:  HTN--Morning BP's are typically mid-130's.  Brings in list ranging from 118-152/70-low 80's.  Any high value was repeated 5 minutes later, and came down to the 130's.  Denies headaches, dizziness, edema, chest pain, palpitations, shortness of breath. Started exercise routine 3x/week (whenever he is in town). Walks a lot when in [place]. He does not notice any significant weight loss or change in waist size. He reports that BP was noted to be 100/80 at "doc in the box" while on vacation. He is wondering if meds need to be changed due to the new guidelines and his BP's at home being mostly >638 systolic.  He was noted to have cystic lesion in pancreas during CT (f/b MRI) as part of evaluation of GI symptoms/vomiting, back in September 2015. He hasn't had any GI complaints. He had f/u MRI last year, 01/2015: IMPRESSION: 1. Stable to minimal increase in size of the well-defined cystic lesion in the uncinate process of the pancreas. Imaging features remain compatible with serous cystadenoma. Follow-up MRI in 1 year could be used to ensure continued stability. This recommendation follows ACR consensus guidelines: Managing Incidental Findings on Abdominal CT: White Paper of the ACR Incidental Findings Committee. J Am Coll Radiol 2010;7:754-773. 2. Slight irregularity in the fundal wall of the gallbladder is stable, likely secondary to adenomyomatosis.  Prostate cancer--he is under the care of Dr. Diona Fanti.  He had biopsies repeated last year, and  2 of the biopsies showed Gleason 3+3=6 adenocarcinoma. He is continuing with surveillance every 6 months. Last PSA (that I have record of) was 5.69 in 12/2014. He reports he had  one in July at his last visit (I don't have result--he states consistent in the 5-6 range, unchanged). Genomic prostate score done in July was "very low risk". He would like this repeated today with his other labs, and results sent to Dr. Diona Fanti.  Gout--no flares in the last year. Compliant with his medication.   ED-- Has been using Cialis or Viagra (from [place] and Dr. Diona Fanti), and seems to work adequately.  Vitamin D level was a little low at 28 last year.  He admits that he isn't taking vitamin D or MVI (has MVI at home, doesn't take).  GERD:  Needs to take PPI about 5x/month, often on Saturdays (more coffee?). Denies dysphagia.  Immunization History  Administered Date(s) Administered  . Hepatitis A 01/27/2006  . Influenza Split 01/10/2009, 01/03/2011, 01/06/2012  . Influenza, High Dose Seasonal PF 02/11/2013, 01/31/2014, 02/02/2015  . Pneumococcal Conjugate-13 02/11/2013  . Pneumococcal Polysaccharide-23 01/31/2014  . Tdap 02/22/2009  . Zoster 01/25/2006   Last colonoscopy: 2011, normal per patient  Last PSA: monitored by Dr. Beatrix Fetters Dentist: yearly  Ophtho: yearly (went last month) Derm: sees Dr. Delman Cheadle twice yearly Exercise:40 minutes 3x/week--strength training/weights (20), treadmill (20 mins). Walks 3-5 miles 3x/week walking [place] (when there for work).  Other Doctors caring for patient: Ophtho: [facility] Jer[facility] at [facility] Urology: Dr. Diona Fanti Dermatology: Dr. Jari Pigg Dentist: Dr. Evans Lance  Fall Screen: no falls Depression screen:  Negative Functional status survey: unremarkable See epic for full screens. He reports having Healthcare power of attorney and living will  Past Medical History:  Diagnosis Date  . ED (erectile dysfunction)   . Gout   . Hemorrhoids   . Hypertension    elevated BP.  Marland Kitchen Left inguinal hernia    noted on CT 10/2013  . Partial tear of Achilles tendon summer 2005   left  . Premature ejaculation   . Prostate cancer ([place])     surveillance; Dr. Dahlstedt:12-30-14 prostate CA on 2 biopsies,Gleason for both 3+3=^   . Right rotator cuff tear     Past Surgical History:  Procedure Laterality Date  . pyloric stenosis surgery  57 weeks old.  Marland Kitchen RETINAL DETACHMENT SURGERY  03/2014   "tacked it"  . ROTATOR CUFF REPAIR Right    Dr. Rhona Raider  . TONSILLECTOMY    . VASECTOMY      Social History   Social History  . Marital status: Married    Spouse name: N/A  . Number of children: 2  . Years of education: N/A   Occupational History  . leadership training    Social History Main Topics  . Smoking status: Former Smoker    Types: Pipe  . Smokeless tobacco: Never Used     Comment: quit smoking pipe in the 80's  . Alcohol use 0.0 oz/week     Comment: 8-10 drinks per week (less when home, same when traveling)  . Drug use: No  . Sexual activity: Yes    Partners: Female   Other Topics Concern  . Not on file   Social History Narrative   Lives with wife, stepson Medical illustrator at [employer]) and dog.  Son died at 9. Daughter in [place] (has 1 daughter), Son in [place].  [place] a lot with his job--home only 3 days/week   5 grandchildren (2 from daughter, 3 from son).   09/2015--moved mother and brother to facilities in [place], near his daughter    Family History  Problem Relation Age of Onset  . Asthma Mother   . Osteoporosis Mother   . Leukemia Father   . Hyperlipidemia Father     increased chol  . Coronary artery disease Father 53    CABG  . Cancer Father     leukemia  . Down syndrome Brother   . Deep vein thrombosis Brother 20  . Gout Brother   . Dementia Brother   . Hodgkin's lymphoma Son   . Gout Son   . Cancer Maternal Grandfather     pancreatic cancer (and lung?/smoker)  . Diabetes Neg Hx     Outpatient Encounter Prescriptions as of 02/05/2016  Medication Sig Note  . allopurinol (ZYLOPRIM) 300 MG tablet take 1 tablet by mouth once daily   . DEXILANT 60 MG capsule TAKE 1 CAPSULE BY MOUTH DAILY BEFORE SUPPER  02/02/2015: Takes about once a month.  . lisinopril-hydrochlorothiazide (PRINZIDE,ZESTORETIC) 20-25 MG tablet take 1 tablet by mouth once daily   . sildenafil (VIAGRA) 100 MG tablet take 1/2 to 1 tablet by mouth once daily if needed for ERECTILE DYSFUNCTION   . tadalafil (CIALIS) 20 MG tablet Take 0.5-1 tablets (10-20 mg total) by mouth every other day as needed for erectile dysfunction.   . tamsulosin (FLOMAX) 0.4 MG CAPS capsule Take 0.4 mg by mouth daily.   . B Complex-C (SUPER B COMPLEX PO) Take 1 tablet by mouth daily. 02/05/2016: Takes sporadically  . Cholecalciferol (VITAMIN D) 2000 UNITS tablet Take 2,000 Units by mouth daily.   . Multiple Vitamins-Minerals (CENTRUM ADULTS PO) Take 1 tablet by mouth daily. 02/05/2016:  Takes very sporadically  . zolpidem (AMBIEN) 10 MG tablet Take 1 tablet (10 mg total) by mouth at bedtime as needed for sleep. (Patient not taking: Reported on 02/05/2016) 01/31/2014: Just for overseas travel   No facility-administered encounter medications on file as of 02/05/2016.     No Known Allergies  ROS: The patient denies anorexia, fever, headaches, vision loss, decreased hearing, ear pain, hoarseness, chest pain, palpitations, dizziness, syncope, dyspnea on exertion, cough, swelling, nausea, vomiting, diarrhea, constipation, abdominal pain, melena, hematochezia, hematuria, incontinence, nocturia, weakened urine stream, dysuria, genital lesions, joint pains, numbness, tingling, weakness, tremor, depression, anxiety, abnormal bleeding/bruising, or enlarged lymph nodes  +ED   PHYSICAL EXAM:  BP 132/80 (BP Location: Left Arm, Patient Position: Sitting, Cuff Size: Normal)   Pulse 72   Ht 6' (1.829 m)   Wt 231 lb 12.8 oz (105.1 kg)   BMI 31.44 kg/m   130/82 on repeat by MD, RA Pt's monitor 130/85 on LA  General Appearance:  Alert, cooperative, no distress, appears stated age   Head:  Normocephalic, without obvious abnormality, atraumatic   Eyes:   PERRL, conjunctiva/corneas clear, EOM's intact, fundi benign. 49m flesh colored mole on right lower eye lid, in center (unchanged)  Ears:  Normal TM's and EAC's  Nose:  Nares normal, mucosa normal, no drainage or sinus tenderness   Throat:  Lips, mucosa, and tongue normal; teeth and gums normal   Neck:  Supple, no lymphadenopathy; thyroid: no enlargement/tenderness/nodules; no carotid bruit or JVD   Back:  Spine nontender, no curvature, ROM normal, no CVA tenderness   Lungs:  Clear to auscultation bilaterally without wheezes, rales or ronchi; respirations unlabored   Chest Wall:  No tenderness or deformity   Heart:  Regular rate and rhythm, S1 and S2 normal, no murmur, rub or gallop   Breast Exam:  No chest wall tenderness, masses or gynecomastia   Abdomen:  Soft, non-tender, nondistended, normoactive bowel sounds,  no masses, no hepatosplenomegaly. +abdominal obesity   Genitalia:  Deferred to urology  Rectal:  Deferred to urology   Extremities:  No clubbing, cyanosis or edema   Pulses:  2+ and symmetric all extremities   Skin:  Skin color, texture, turgor normal, no rashes or lesions. Angiomas on chest/abdomen.   Lymph nodes:  Cervical, supraclavicular, and axillary nodes normal   Neurologic:  CNII-XII intact, normal strength, sensation and gait; reflexes 2+ and symmetric throughout    Psych:   Normal mood, affect, hygiene and grooming   ASSESSMENT/PLAN:  Annual physical exam - Plan: POCT Urinalysis Dipstick, Lipid panel, Comprehensive metabolic panel, CBC with Differential/Platelet, VITAMIN D 25 Hydroxy (Vit-D Deficiency, Fractures), TSH, PSA  Essential hypertension, benign - controlled; Discussed fluctuations. With it being 100/80 when relaxed, and highest when med wearing off, no change recommended. Weight loss recommended - Plan: Lipid panel, Comprehensive metabolic panel, lisinopril-hydrochlorothiazide  (PRINZIDE,ZESTORETIC) 20-25 MG tablet  Gout of foot, unspecified cause, unspecified chronicity, unspecified laterality - doing well on allopurinol without flares - Plan: allopurinol (ZYLOPRIM) 300 MG tablet  Prostate cancer ([facility]) - stable with q 6 month surveillance. Results to Dr. DDiona Fanti- Plan: PSA  Erectile dysfunction, unspecified erectile dysfunction type - Plan: tadalafil (CIALIS) 20 MG tablet  Medication monitoring encounter - Plan: Comprehensive metabolic panel, CBC with Differential/Platelet  Obesity (BMI 30.0-34.9)  Need for prophylactic vaccination and inoculation against influenza - Plan: Flu vaccine HIGH DOSE PF (Fluzone High dose)   Recommended at least 30 minutes of aerobic activity at least 5 days/week (150 min),  weight-bearing exercise 2x/wk; proper sunscreen use reviewed; healthy diet and alcohol recommendations (less than or equal to 2 drinks/day) reviewed; regular seatbelt use; changing batteries in smoke detectors. Self-testicular exams. Immunization recommendations discussed--flu shot given; recommend Shingrix, when available.  Colonoscopy recommendations reviewed, UTD.  Discussed 2016 MRI results; reassuring and benign, discussed whether further recheck for reassurance is needed.  Prefers to hold off now, consider repeat next year (2 years from last, for additional reassurance that it remains stable). MRI is what we have been following. He commented on cost--can consider CT if mainly following size.  Send PSA to Dr. Diona Fanti Requested to be done by patient today  Vitamin D was a little low last year.  Recheck today.  Discussed daily MVI (to start taking what he has at home) vs daily Vit D. Await results for final recommendation.  Full Code, Full Care. Recommended we get copies of LW, hcPOA.  PSA, Vit D, CBC, c-met, TSH, lipid Can hold off on checking uric acid since no flares (and would not change management)

## 2016-02-05 ENCOUNTER — Encounter: Payer: Self-pay | Admitting: Family Medicine

## 2016-02-05 ENCOUNTER — Ambulatory Visit (INDEPENDENT_AMBULATORY_CARE_PROVIDER_SITE_OTHER): Payer: 59 | Admitting: Family Medicine

## 2016-02-05 VITALS — BP 132/80 | HR 72 | Ht 72.0 in | Wt 231.8 lb

## 2016-02-05 DIAGNOSIS — N529 Male erectile dysfunction, unspecified: Secondary | ICD-10-CM | POA: Diagnosis not present

## 2016-02-05 DIAGNOSIS — C61 Malignant neoplasm of prostate: Secondary | ICD-10-CM

## 2016-02-05 DIAGNOSIS — Z5181 Encounter for therapeutic drug level monitoring: Secondary | ICD-10-CM | POA: Diagnosis not present

## 2016-02-05 DIAGNOSIS — Z0000 Encounter for general adult medical examination without abnormal findings: Secondary | ICD-10-CM | POA: Diagnosis not present

## 2016-02-05 DIAGNOSIS — M109 Gout, unspecified: Secondary | ICD-10-CM | POA: Diagnosis not present

## 2016-02-05 DIAGNOSIS — Z23 Encounter for immunization: Secondary | ICD-10-CM | POA: Diagnosis not present

## 2016-02-05 DIAGNOSIS — E669 Obesity, unspecified: Secondary | ICD-10-CM

## 2016-02-05 DIAGNOSIS — I10 Essential (primary) hypertension: Secondary | ICD-10-CM

## 2016-02-05 DIAGNOSIS — E66811 Obesity, class 1: Secondary | ICD-10-CM

## 2016-02-05 LAB — POCT URINALYSIS DIPSTICK
Bilirubin, UA: NEGATIVE
Blood, UA: NEGATIVE
Glucose, UA: NEGATIVE
Ketones, UA: NEGATIVE
Leukocytes, UA: NEGATIVE
Nitrite, UA: NEGATIVE
Protein, UA: NEGATIVE
Spec Grav, UA: >=1.03
Urobilinogen, UA: NEGATIVE
pH, UA: 5.5

## 2016-02-05 LAB — CBC WITH DIFFERENTIAL/PLATELET
Basophils Absolute: 0 cells/uL (ref 0–200)
Basophils Relative: 0 %
Eosinophils Absolute: 264 cells/uL (ref 15–500)
Eosinophils Relative: 4 %
HCT: 45.7 % (ref 38.5–50.0)
Hemoglobin: 15.1 g/dL (ref 13.2–17.1)
Lymphocytes Relative: 29 %
Lymphs Abs: 1914 cells/uL (ref 850–3900)
MCH: 30.1 pg (ref 27.0–33.0)
MCHC: 33 g/dL (ref 32.0–36.0)
MCV: 91.2 fL (ref 80.0–100.0)
MPV: 10 fL (ref 7.5–12.5)
Monocytes Absolute: 594 cells/uL (ref 200–950)
Monocytes Relative: 9 %
Neutro Abs: 3828 cells/uL (ref 1500–7800)
Neutrophils Relative %: 58 %
Platelets: 268 10*3/uL (ref 140–400)
RBC: 5.01 MIL/uL (ref 4.20–5.80)
RDW: 14.4 % (ref 11.0–15.0)
WBC: 6.6 10*3/uL (ref 4.0–10.5)

## 2016-02-05 LAB — TSH: TSH: 1.03 mIU/L (ref 0.40–4.50)

## 2016-02-05 MED ORDER — TADALAFIL 20 MG PO TABS: 10.0000 mg | ORAL_TABLET | ORAL | 11 refills | Status: DC | PRN

## 2016-02-05 MED ORDER — LISINOPRIL-HYDROCHLOROTHIAZIDE 20-25 MG PO TABS: ORAL_TABLET | ORAL | 3 refills | Status: DC

## 2016-02-05 MED ORDER — ALLOPURINOL 300 MG PO TABS: ORAL_TABLET | ORAL | 3 refills | Status: DC

## 2016-02-05 NOTE — Patient Instructions (Addendum)
  HEALTH MAINTENANCE RECOMMENDATIONS:  It is recommended that you get at least 30 minutes of aerobic exercise at least 5 days/week (for weight loss, you may need as much as 60-90 minutes). This can be any activity that gets your heart rate up. This can be divided in 10-15 minute intervals if needed, but try and build up your endurance at least once a week.  Weight bearing exercise is also recommended twice weekly.  Eat a healthy diet with lots of vegetables, fruits and fiber.  "Colorful" foods have a lot of vitamins (ie green vegetables, tomatoes, red peppers, etc).  Limit sweet tea, regular sodas and alcoholic beverages, all of which has a lot of calories and sugar.  Up to 2 alcoholic drinks daily may be beneficial for men (unless trying to lose weight, watch sugars).  Drink a lot of water.  Sunscreen of at least SPF 30 should be used on all sun-exposed parts of the skin when outside between the hours of 10 am and 4 pm (not just when at beach or pool, but even with exercise, golf, tennis, and yard work!)  Use a sunscreen that says "broad spectrum" so it covers both UVA and UVB rays, and make sure to reapply every 1-2 hours.  Remember to change the batteries in your smoke detectors when changing your clock times in the spring and fall.  Use your seat belt every time you are in a car, and please drive safely and not be distracted with cell phones and texting while driving.   Please get [place] copies of your living will and healthcare power of attorney at your convenience.  I recommend the new shingles vaccine (Shingrix)--a series of 2 vaccines 2 months apart.  We do not yet have this available (too new!).  Increase your aerobic exercise to 150 minutes/week. Work on reducing your waist size.

## 2016-02-06 LAB — PSA: PSA: 4.2 ng/mL — ABNORMAL HIGH (ref <=4.0)

## 2016-02-06 LAB — COMPREHENSIVE METABOLIC PANEL
ALT: 26 U/L (ref 9–46)
AST: 20 U/L (ref 10–35)
Albumin: 4.7 g/dL (ref 3.6–5.1)
Alkaline Phosphatase: 66 U/L (ref 40–115)
BUN: 19 mg/dL (ref 7–25)
CO2: 25 mmol/L (ref 20–31)
Calcium: 9.7 mg/dL (ref 8.6–10.3)
Chloride: 103 mmol/L (ref 98–110)
Creat: 0.89 mg/dL (ref 0.70–1.25)
Glucose, Bld: 100 mg/dL — ABNORMAL HIGH (ref 65–99)
Potassium: 4.5 mmol/L (ref 3.5–5.3)
Sodium: 138 mmol/L (ref 135–146)
Total Bilirubin: 0.6 mg/dL (ref 0.2–1.2)
Total Protein: 7.2 g/dL (ref 6.1–8.1)

## 2016-02-06 LAB — LIPID PANEL
Cholesterol: 176 mg/dL (ref <200)
HDL: 53 mg/dL (ref >40)
LDL Cholesterol: 107 mg/dL — ABNORMAL HIGH (ref <100)
Total CHOL/HDL Ratio: 3.3 Ratio (ref <5.0)
Triglycerides: 79 mg/dL (ref <150)
VLDL: 16 mg/dL (ref <30)

## 2016-02-06 LAB — VITAMIN D 25 HYDROXY (VIT D DEFICIENCY, FRACTURES): Vit D, 25-Hydroxy: 28 ng/mL — ABNORMAL LOW (ref 30–100)

## 2016-02-27 ENCOUNTER — Other Ambulatory Visit: Payer: Self-pay | Admitting: Family Medicine

## 2016-03-05 ENCOUNTER — Telehealth: Payer: Self-pay | Admitting: Family Medicine

## 2016-03-05 NOTE — Telephone Encounter (Signed)
Pt called and requested labs be sent to [facility]. Labs faxed to [phone].

## 2016-05-22 ENCOUNTER — Telehealth: Payer: Self-pay | Admitting: Family Medicine

## 2016-05-22 NOTE — Telephone Encounter (Signed)
[place] for nurse visit.  He will need second injection between 2-6 months after the first.  Please let him know that the most signficant side effect has been injection site discomfort/local reaction. (not a live virus vaccine like the other shingles vaccine was).  We have it documented that he is willing to pay out of pocket if not covered, so that works for me (check that Lenna Sciara agrees with me!)

## 2016-05-22 NOTE — Telephone Encounter (Signed)
Pt called and would like to come in for the new Shingrix shot, if it ok with you,  I told him he needed to call his insurance to see if they would cover it and he stated that he did not care if they covered or not he wanted to get it anyway, pt can be reached at [phone]

## 2016-05-28 NOTE — Telephone Encounter (Signed)
Left message for pt to call. Pt's insurance company was called to inquire about cost of Shingrix. Per Insurance benefits department at [phone], cost to have injections at [facility]  will be a estimated total of $187.60. The cost to have same injections here will be $640.00.

## 2016-05-29 NOTE — Telephone Encounter (Signed)
Pt called me back and all info concerning cost and side effects of vaccine where discussed. Pt verified understanding. Pt is requesting a written RX for Vaccine that he can take to the pharmacy. Please mail rx to pt at verified address on system.

## 2016-05-29 NOTE — Telephone Encounter (Signed)
Mailed

## 2016-05-29 NOTE — Telephone Encounter (Signed)
Ok for Rx--to state Shingrix vaccine x 2 doses (second dose to be given within 2-6 months after the first).

## 2016-06-20 ENCOUNTER — Telehealth: Payer: Self-pay | Admitting: Family Medicine

## 2016-06-20 DIAGNOSIS — I10 Essential (primary) hypertension: Secondary | ICD-10-CM

## 2016-06-20 MED ORDER — LISINOPRIL-HYDROCHLOROTHIAZIDE 20-25 MG PO TABS: ORAL_TABLET | ORAL | 1 refills | Status: DC

## 2016-06-20 NOTE — Telephone Encounter (Signed)
Sent to new pharmacy. Mark Caldwell

## 2016-06-20 NOTE — Telephone Encounter (Signed)
Rcvd request to change #90 Lisinopril 20-25 mg script to NEW PH[facility]ACY [facility] [address]

## 2016-09-03 DIAGNOSIS — D692 Other nonthrombocytopenic purpura: Secondary | ICD-10-CM | POA: Diagnosis not present

## 2016-09-03 DIAGNOSIS — L570 Actinic keratosis: Secondary | ICD-10-CM | POA: Diagnosis not present

## 2016-09-13 DIAGNOSIS — C61 Malignant neoplasm of prostate: Secondary | ICD-10-CM | POA: Diagnosis not present

## 2016-09-20 DIAGNOSIS — N5201 Erectile dysfunction due to arterial insufficiency: Secondary | ICD-10-CM | POA: Diagnosis not present

## 2016-09-20 DIAGNOSIS — C61 Malignant neoplasm of prostate: Secondary | ICD-10-CM | POA: Diagnosis not present

## 2016-10-24 ENCOUNTER — Telehealth: Payer: Self-pay

## 2016-10-24 MED ORDER — ZOLPIDEM TARTRATE 10 MG PO TABS: 10.0000 mg | ORAL_TABLET | Freq: Every evening | ORAL | 0 refills | Status: DC | PRN

## 2016-10-24 NOTE — Telephone Encounter (Signed)
[facility]ien. Mark Caldwell

## 2016-10-24 NOTE — Telephone Encounter (Signed)
Done

## 2016-10-24 NOTE — Telephone Encounter (Signed)
He only uses this for overseas travel, last filled in 2015. Okay to refill #20

## 2016-11-15 DIAGNOSIS — Z808 Family history of malignant neoplasm of other organs or systems: Secondary | ICD-10-CM | POA: Diagnosis not present

## 2016-11-15 DIAGNOSIS — L723 Sebaceous cyst: Secondary | ICD-10-CM | POA: Diagnosis not present

## 2016-11-15 DIAGNOSIS — Z23 Encounter for immunization: Secondary | ICD-10-CM | POA: Diagnosis not present

## 2016-11-15 DIAGNOSIS — D2261 Melanocytic nevi of right upper limb, including shoulder: Secondary | ICD-10-CM | POA: Diagnosis not present

## 2016-11-15 DIAGNOSIS — L570 Actinic keratosis: Secondary | ICD-10-CM | POA: Diagnosis not present

## 2016-11-15 DIAGNOSIS — D2230 Melanocytic nevi of unspecified part of face: Secondary | ICD-10-CM | POA: Diagnosis not present

## 2016-11-15 DIAGNOSIS — D171 Benign lipomatous neoplasm of skin and subcutaneous tissue of trunk: Secondary | ICD-10-CM | POA: Diagnosis not present

## 2016-11-15 DIAGNOSIS — L821 Other seborrheic keratosis: Secondary | ICD-10-CM | POA: Diagnosis not present

## 2016-11-22 ENCOUNTER — Telehealth: Payer: Self-pay | Admitting: Family Medicine

## 2016-11-22 NOTE — Telephone Encounter (Signed)
Pt states got Shingrix 5/18 at pharmacy & since then his pharmacy hasn't gotten any more in and he's concerned because he's getting close to the 6 month end date with his second dose.  Pt wants to know if he can come here for 2nd one and pay out of pocket if insurance will not pay?  He is to call his insurance in the meantime and ask.   I discussed with Melissa and pt has [employer] & they will not pay and cost is $319.   Dr. Tomi Bamberger can pt get 2nd dose here and pay out of pocket?

## 2016-11-23 NOTE — Telephone Encounter (Signed)
We have other patients that have been getting it at the pharmacies recently.  They have been getting some in (just as we have).  He may need to leave his name on a waiting list though, as I believe many pharmacies do keep a list.  If he would prefer to pay out of pocket and not worry, someone needs to check to see that we actually have one available to give him (gets priority over a new start, but not taking someone else's second dose away).

## 2016-11-25 DIAGNOSIS — R69 Illness, unspecified: Secondary | ICD-10-CM | POA: Diagnosis not present

## 2016-11-25 NOTE — Telephone Encounter (Signed)
Left message to let patient know that I do have one dose if he would like to pay for it. He can call me back and I will save for him.

## 2017-01-10 DIAGNOSIS — H11441 Conjunctival cysts, right eye: Secondary | ICD-10-CM | POA: Diagnosis not present

## 2017-01-10 DIAGNOSIS — H52221 Regular astigmatism, right eye: Secondary | ICD-10-CM | POA: Diagnosis not present

## 2017-01-10 DIAGNOSIS — H2513 Age-related nuclear cataract, bilateral: Secondary | ICD-10-CM | POA: Diagnosis not present

## 2017-01-10 DIAGNOSIS — H33032 Retinal detachment with giant retinal tear, left eye: Secondary | ICD-10-CM | POA: Diagnosis not present

## 2017-01-10 DIAGNOSIS — H524 Presbyopia: Secondary | ICD-10-CM | POA: Diagnosis not present

## 2017-01-14 DIAGNOSIS — M109 Gout, unspecified: Secondary | ICD-10-CM | POA: Diagnosis not present

## 2017-02-03 ENCOUNTER — Other Ambulatory Visit: Payer: Self-pay | Admitting: Family Medicine

## 2017-02-03 DIAGNOSIS — M109 Gout, unspecified: Secondary | ICD-10-CM

## 2017-02-04 NOTE — Progress Notes (Signed)
Chief Complaint  Patient presents with  . Annual Exam    fasting annual exam. Just had eye exam with Dr Jerline Pain @ [facility]. Also would like to discuss increasing allopurinol or maybe some other preventive option for gout. No other concerns.     Mark Caldwell is a 71 y.o. male who presents for a complete physical.  He has the following concerns:  Hypertension:  Denies headaches, dizziness, edema, chest pain, palpitations, shortness of breath. Blood pressures are running: 110-120 when at other doctor's offices (dentist, etc).  It is about the same when he checks it at home, only rarely above 740, diastolic 81-44. Goes to the gym daily whenever he is in [place] (just 2 days/week), still not exercising when out of town. Walks a lot when in [place].  Has gained some weight--traveling more recently.  He was noted to have cystic lesion in pancreas during CT (f/b MRI) as part of evaluation of GI symptoms/vomiting, back in September 2015. He hasn't had any GI complaints. He had f/u MRI in 01/2015: IMPRESSION: 1. Stable to minimal increase in size of the well-defined cystic lesion in the uncinate process of the pancreas. Imaging features remain compatible with serous cystadenoma. Follow-up MRI in 1 year could be used to ensure continued stability. This recommendation follows ACR consensus guidelines: Managing Incidental Findings on Abdominal CT: White Paper of the ACR Incidental Findings Committee. J Am Coll Radiol 2010;7:754-773. 2. Slight irregularity in the fundal wall of the gallbladder is stable, likely secondary to adenomyomatosis.  Last year, we discussed the potential to f/u in 1 year, and whether further recheck for reassurance is needed. He preferred not to have MRI repeated last year. He feels like it is a waste of time and money.  Prefers to wait, and do another MRI at some point (offered CT, prefers MRI, just not now).  Prostate cancer--he is under the care of Dr. Diona Fanti, on active  surveillance.  On last biopsy (2 years ago) 2 of the biopsies showed Gleason 3+3=6 adenocarcinoma.He is continuing with surveillance every 6 months. Last PSA (that I have record of) was 5.65 in 08/2016, stable. Scheduled again in February, and may have another biopsy later in the year. He previously reported that Genomic prostate score done in July 2017 was "very low risk". He is requesting PSA to be checked today and forwarded to Dr. Diona Fanti (rather than getting drawn at his office in February).  Gout--He had a recent flare while driving to [place] the week of Thanksgiving. He had swelling of his right hand. He doesn't drink any fluids while driving.  He had a burger the night before and that morning before driving. He denies missing any allopurinol dose. He admits to having had worse diet without flares in the last 10 years, and also long drives (with limited fluid intake) without problems. He went to an urgent care in [place] and was treated with prednisone, colchicine, oxycodone.  Symptoms improved within 5-7 days. He still had slight twinges in the right hand even last weekend.  He finished all the colchicine (took 3 the first day, then 2/day until finished, did have some diarrhea). He admits that he doubled up on his allopurinol for 3-4 days during that time, on his own.  ED-- Has been using Cialis or Viagra (from [place] and Dr. Diona Fanti), and seems to work adequately.  Vitamin D level was a little low at 28 last year.  At that time he wasn't taking MVI or Vitamin D.  He is  currently taking Vitamin D inconsistently, maybe 2x/week, along with MVI and B complex.  GERD:  Needs to take PPI only about 1x/month. Denies dysphagia.  Immunization History  Administered Date(s) Administered  . Hepatitis A 01/27/2006  . Influenza Split 01/10/2009, 01/03/2011, 01/06/2012  . Influenza, High Dose Seasonal PF 02/11/2013, 01/31/2014, 02/02/2015, 02/05/2016, 11/23/2016  . Pneumococcal Conjugate-13 02/11/2013  .  Pneumococcal Polysaccharide-23 01/31/2014  . Tdap 02/22/2009  . Zoster 01/25/2006  . Zoster Recombinat (Shingrix) 06/25/2016, 11/23/2016    Last colonoscopy: 2011, normal per patient  Last PSA: monitored by Dr. Diona Fanti.  Last check 08/2016, level 5.65, "stable" Dentist: yearly  Ophtho: yearly  Derm: sees Dr. Delman Cheadle twice yearly Exercise:40 minutes 2x/week--strength training/weights (30), treadmill (30 mins). Walks 3-5 miles 3x/week walking [place] (when there for work).  Other Doctors caring for patient: Ophtho: Dr. Jerline Pain at [facility] Urology: Dr. Diona Fanti Dermatology: Dr. Jari Pigg Dentist: Dr. Jetty Duhamel  Fall Screen: no falls Depression screen:  Negative Functional status survey: unremarkable See epic for full screens. He reports having Healthcare power of attorney and living will  Past Medical History:  Diagnosis Date  . ED (erectile dysfunction)   . Gout   . Hemorrhoids   . Hypertension    elevated BP.  Marland Kitchen Left inguinal hernia    noted on CT 10/2013  . Partial tear of Achilles tendon summer 2005   left  . Premature ejaculation   . Prostate cancer ([place])    surveillance; Dr. Dahlstedt:12-30-14 prostate CA on 2 biopsies,Gleason for both 3+3=^   . Right rotator cuff tear     Past Surgical History:  Procedure Laterality Date  . pyloric stenosis surgery  20 weeks old.  Marland Kitchen RETINAL DETACHMENT SURGERY  03/2014   "tacked it"  . ROTATOR CUFF REPAIR Right    Dr. Rhona Raider  . TONSILLECTOMY    . VASECTOMY      Social History   Socioeconomic History  . Marital status: Married    Spouse name: Not on file  . Number of children: 2  . Years of education: Not on file  . Highest education level: Not on file  Social Needs  . Financial resource strain: Not on file  . Food insecurity - worry: Not on file  . Food insecurity - inability: Not on file  . Transportation needs - medical: Not on file  . Transportation needs - non-medical: Not on file  Occupational History  .  Occupation: Personnel officer  Tobacco Use  . Smoking status: Former Smoker    Types: Pipe  . Smokeless tobacco: Never Used  . Tobacco comment: quit smoking pipe in the 80's  Substance and Sexual Activity  . Alcohol use: Yes    Alcohol/week: 0.0 oz    Comment: 8-10 drinks per week (less when home, same when traveling)  . Drug use: No  . Sexual activity: Yes    Partners: Female  Other Topics Concern  . Not on file  Social History Narrative   Lives with wife, stepson Medical illustrator at [employer]) and dog.  Son died at 48. Daughter in [place] (has 1 daughter, 1 son), Son in [place].  [place] a lot with his job--home only 2-3 days/week   5 grandchildren (2 from daughter, 3 from son).   09/2015--moved mother and brother to facilities in [place], near his daughter    Family History  Problem Relation Age of Onset  . Asthma Mother   . Osteoporosis Mother   . Hypothyroidism Mother   . Leukemia Father   .  Hyperlipidemia Father        increased chol  . Coronary artery disease Father 31       CABG  . Cancer Father        leukemia  . Down syndrome Brother   . Deep vein thrombosis Brother 20  . Gout Brother   . Dementia Brother   . Hodgkin's lymphoma Son   . Gout Son   . Cancer Maternal Grandfather        pancreatic cancer (and lung?/smoker)  . Diabetes Neg Hx     Outpatient Encounter Medications as of 02/06/2017  Medication Sig Note  . allopurinol (ZYLOPRIM) 300 MG tablet take 1 tablet by mouth once daily   . lisinopril-hydrochlorothiazide (PRINZIDE,ZESTORETIC) 20-25 MG tablet take 1 tablet by mouth once daily   . sildenafil (VIAGRA) 100 MG tablet take 1/2 to 1 tablet by mouth once daily if needed for ERECTILE DYSFUNCTION   . tamsulosin (FLOMAX) 0.4 MG CAPS capsule Take 0.4 mg by mouth daily.   . B Complex-C (SUPER B COMPLEX PO) Take 1 tablet by mouth daily. 02/05/2016: Takes sporadically  . Cholecalciferol (VITAMIN D) 2000 UNITS tablet Take 2,000 Units by mouth daily. 02/06/2017: Takes  sporadically, 2x/week  . DEXILANT 60 MG capsule take 1 capsule by mouth once daily BEFORE SUPPER (Patient not taking: Reported on 02/06/2017) 02/06/2017: Rarely needs, 1x/month  . Multiple Vitamins-Minerals (CENTRUM ADULTS PO) Take 1 tablet by mouth daily. 02/05/2016: Takes very sporadically  . tadalafil (CIALIS) 20 MG tablet Take 0.5-1 tablets (10-20 mg total) by mouth every other day as needed for erectile dysfunction. (Patient not taking: Reported on 02/06/2017)   . zolpidem (AMBIEN) 10 MG tablet Take 1 tablet (10 mg total) by mouth at bedtime as needed for sleep. (Patient not taking: Reported on 02/06/2017) 02/06/2017: Only when traveling overseas   No facility-administered encounter medications on file as of 02/06/2017.     No Known Allergies   ROS: The patient denies anorexia, fever, headaches, vision loss, decreased hearing, ear pain, hoarseness, chest pain, palpitations, dizziness, syncope, dyspnea on exertion, cough, swelling, nausea, vomiting, diarrhea, constipation, abdominal pain, melena, hematochezia, hematuria, incontinence, nocturia, weakened urine stream, dysuria, genital lesions, joint pains, numbness, tingling, weakness, tremor, depression, anxiety, abnormal bleeding/bruising, or enlarged lymph nodes  +ED +recent gout flare right hand 7# weight gain in last year   PHYSICAL EXAM:  BP 136/86   Pulse 64   Ht '6\' 1"'  (1.854 m)   Wt 238 lb 3.2 oz (108 kg)   BMI 31.43 kg/m   Wt Readings from Last 3 Encounters:  02/06/17 238 lb 3.2 oz (108 kg)  02/05/16 231 lb 12.8 oz (105.1 kg)  02/02/15 232 lb (105.2 kg)     General Appearance:  Alert, cooperative, no distress, appears stated age   Head:  Normocephalic, without obvious abnormality, atraumatic   Eyes:  PERRL, conjunctiva/corneas clear, EOM's intact, fundi benign. 48m flesh colored mole on right lower eye lid, in center (unchanged)  Ears:  Normal TM's and EAC's  Nose:  Nares normal, mucosa normal, no  drainage or sinus tenderness   Throat:  Lips, mucosa, and tongue normal; teeth and gums normal   Neck:  Supple, no lymphadenopathy; thyroid: no enlargement/tenderness/ nodules; no carotid bruit or JVD   Back:  Spine nontender, no curvature, ROM normal, no CVA tenderness   Lungs:  Clear to auscultation bilaterally without wheezes, rales or ronchi; respirations unlabored   Chest Wall:  No tenderness or deformity   Heart:  Regular  rate and rhythm, S1 and S2 normal, no murmur, rub or gallop   Breast Exam:  No chest wall tenderness, masses or gynecomastia   Abdomen:  Soft, non-tender, nondistended, normoactive bowel sounds, no masses, no hepatosplenomegaly. +abdominal obesity   Genitalia:  No lesions; testicles normal, no masses. No significant hernias noted  Rectal:  Deferred to urology   Extremities:  No clubbing, cyanosis or edema   Pulses:  2+ and symmetric all extremities   Skin:  Skin color, texture, turgor normal, no rashes or lesions. Angiomas on chest/abdomen.   Lymph nodes:  Cervical, supraclavicular, and axillary nodes normal   Neurologic:  CNII-XII intact, normal strength, sensation and gait; reflexes 2+ and symmetric throughout    Psych: Normal mood, affect, hygiene and grooming    ASSESSMENT/PLAN:  Annual physical exam - Plan: POCT Urinalysis DIP (Proadvantage Device), Lipid panel, Comprehensive metabolic panel, CBC with Differential/Platelet, VITAMIN D 25 Hydroxy (Vit-D Deficiency, Fractures), TSH, PSA, Uric acid  Essential hypertension, benign - controlled per home report, borderline in office. Counseled re: exercise, weight loss - Plan: Comprehensive metabolic panel, lisinopril-hydrochlorothiazide (PRINZIDE,ZESTORETIC) 20-25 MG tablet  Prostate cancer ([place]) - requests PSA drawn today, to foward to urologist. - Plan: PSA  Gout of foot, unspecified cause, unspecified chronicity, unspecified  laterality - recent flare in right hand. pt advised NOT to increase allopurinol during acute flare. May need dose increased if uric acid elevated. colchicine prn - Plan: Uric acid, allopurinol (ZYLOPRIM) 300 MG tablet, colchicine 0.6 MG tablet  Erectile dysfunction, unspecified erectile dysfunction type  Medication monitoring encounter - Plan: Comprehensive metabolic panel, CBC with Differential/Platelet, VITAMIN D 25 Hydroxy (Vit-D Deficiency, Fractures), Uric acid  Obesity (BMI 30.0-34.9) - counseled re: diet/exercise--not likely to change habits/routines. Discussed meds. Later called back requesting Belviq. f/u 3 mos   Discussed wt loss meds--he originally stated that he may consider in May (turns 75) if not able to lose on his own. We discussed his limitations regarding travel, eating out, and that he hasn't been able to lose the weight on his own. Discussed briefly oral meds, including Belviq, Qsymia, Contrave.  He later called back, requesting Belviq. He was asked to schedule 3 mo f/u on the meds.  Recommended at least 30 minutes of aerobic activity at least 5 days/week (150 min), weight-bearing exercise 2x/wk; proper sunscreen use reviewed; healthy diet and alcohol recommendations (less than or equal to 2 drinks/day) reviewed; regular seatbelt use; changing batteries in smoke detectors. Self-testicular exams. Immunization recommendations discussed--up to date, continue yearly high dose flu shots. Colonoscopy recommendations reviewed, UTD.  Full Code, Full Care. Given new forms today, plans to update Living will and healthcare POA. To drop off copies.  PSA, Vit D, CBC, c-met, TSH, lipid, uric acid. PSA done per pt request--forward to Dr. Diona Fanti  Addendum--uric acid 5.4--continue current allopurinol dose (had discussed increasing if over 6).

## 2017-02-06 ENCOUNTER — Encounter: Payer: Self-pay | Admitting: Family Medicine

## 2017-02-06 ENCOUNTER — Telehealth: Payer: Self-pay | Admitting: *Deleted

## 2017-02-06 ENCOUNTER — Ambulatory Visit (INDEPENDENT_AMBULATORY_CARE_PROVIDER_SITE_OTHER): Payer: Medicare HMO | Admitting: Family Medicine

## 2017-02-06 VITALS — BP 136/86 | HR 64 | Ht 73.0 in | Wt 238.2 lb

## 2017-02-06 DIAGNOSIS — Z0000 Encounter for general adult medical examination without abnormal findings: Secondary | ICD-10-CM

## 2017-02-06 DIAGNOSIS — E669 Obesity, unspecified: Secondary | ICD-10-CM

## 2017-02-06 DIAGNOSIS — I10 Essential (primary) hypertension: Secondary | ICD-10-CM | POA: Diagnosis not present

## 2017-02-06 DIAGNOSIS — C61 Malignant neoplasm of prostate: Secondary | ICD-10-CM

## 2017-02-06 DIAGNOSIS — N529 Male erectile dysfunction, unspecified: Secondary | ICD-10-CM | POA: Diagnosis not present

## 2017-02-06 DIAGNOSIS — E66811 Obesity, class 1: Secondary | ICD-10-CM

## 2017-02-06 DIAGNOSIS — Z5181 Encounter for therapeutic drug level monitoring: Secondary | ICD-10-CM

## 2017-02-06 DIAGNOSIS — M109 Gout, unspecified: Secondary | ICD-10-CM | POA: Diagnosis not present

## 2017-02-06 LAB — POCT URINALYSIS DIP (PROADVANTAGE DEVICE)
Bilirubin, UA: NEGATIVE
Blood, UA: NEGATIVE
Glucose, UA: NEGATIVE mg/dL
Ketones, POC UA: NEGATIVE mg/dL
Leukocytes, UA: NEGATIVE
Nitrite, UA: NEGATIVE
Protein Ur, POC: NEGATIVE mg/dL
Specific Gravity, Urine: 1.025
Urobilinogen, Ur: NEGATIVE
pH, UA: 6 (ref 5.0–8.0)

## 2017-02-06 MED ORDER — LISINOPRIL-HYDROCHLOROTHIAZIDE 20-25 MG PO TABS: ORAL_TABLET | ORAL | 3 refills | Status: DC

## 2017-02-06 MED ORDER — ALLOPURINOL 300 MG PO TABS: ORAL_TABLET | ORAL | 3 refills | Status: DC

## 2017-02-06 MED ORDER — LORCASERIN HCL ER 20 MG PO TB24: 1.0000 | ORAL_TABLET | Freq: Every day | ORAL | 2 refills | Status: DC

## 2017-02-06 MED ORDER — COLCHICINE 0.6 MG PO TABS: ORAL_TABLET | ORAL | 0 refills | Status: DC

## 2017-02-06 NOTE — Patient Instructions (Addendum)
  HEALTH MAINTENANCE RECOMMENDATIONS:  It is recommended that you get at least 30 minutes of aerobic exercise at least 5 days/week (for weight loss, you may need as much as 60-90 minutes). This can be any activity that gets your heart rate up. This can be divided in 10-15 minute intervals if needed, but try and build up your endurance at least once a week.  Weight bearing exercise is also recommended twice weekly.  Eat a healthy diet with lots of vegetables, fruits and fiber.  "Colorful" foods have a lot of vitamins (ie green vegetables, tomatoes, red peppers, etc).  Limit sweet tea, regular sodas and alcoholic beverages, all of which has a lot of calories and sugar.  Up to 2 alcoholic drinks daily may be beneficial for men (unless trying to lose weight, watch sugars).  Drink a lot of water.  Sunscreen of at least SPF 30 should be used on all sun-exposed parts of the skin when outside between the hours of 10 am and 4 pm (not just when at beach or pool, but even with exercise, golf, tennis, and yard work!)  Use a sunscreen that says "broad spectrum" so it covers both UVA and UVB rays, and make sure to reapply every 1-2 hours.  Remember to change the batteries in your smoke detectors when changing your clock times in the spring and fall.  Use your seat belt every time you are in a car, and please drive safely and not be distracted with cell phones and texting while driving.  Please work in some extra cardio throughout the week when traveling (goal of 150 minutes/week).  Please work hard to get the weight down, and consider medications if you are unable to do this on your own. (Belviq, Contrave and Qsymia are the pills we spoke briefly)

## 2017-02-06 NOTE — Telephone Encounter (Signed)
Advise patient that I sent rx to his pharmacy with 2 refills.  He needs to schedule f/u in 3 months before refills can be done (need to ensure that it is effective, etc). Controlled, needs 3 month f/u.

## 2017-02-06 NOTE — Telephone Encounter (Signed)
Patient called and would like to take you up on your offer of an rx for Belviq. [facility] [address].

## 2017-02-06 NOTE — Telephone Encounter (Signed)
Left message for patient

## 2017-02-07 LAB — CBC WITH DIFFERENTIAL/PLATELET
Basophils Absolute: 60 cells/uL (ref 0–200)
Basophils Relative: 1 %
Eosinophils Absolute: 288 cells/uL (ref 15–500)
Eosinophils Relative: 4.8 %
HCT: 47.7 % (ref 38.5–50.0)
Hemoglobin: 15.9 g/dL (ref 13.2–17.1)
Lymphs Abs: 1818 cells/uL (ref 850–3900)
MCH: 30.5 pg (ref 27.0–33.0)
MCHC: 33.3 g/dL (ref 32.0–36.0)
MCV: 91.4 fL (ref 80.0–100.0)
MPV: 9.9 fL (ref 7.5–12.5)
Monocytes Relative: 8.4 %
Neutro Abs: 3330 cells/uL (ref 1500–7800)
Neutrophils Relative %: 55.5 %
Platelets: 274 10*3/uL (ref 140–400)
RBC: 5.22 10*6/uL (ref 4.20–5.80)
RDW: 13.8 % (ref 11.0–15.0)
Total Lymphocyte: 30.3 %
WBC mixed population: 504 cells/uL (ref 200–950)
WBC: 6 10*3/uL (ref 3.8–10.8)

## 2017-02-07 LAB — COMPREHENSIVE METABOLIC PANEL
AG Ratio: 1.9 (calc) (ref 1.0–2.5)
ALT: 28 U/L (ref 9–46)
AST: 18 U/L (ref 10–35)
Albumin: 4.7 g/dL (ref 3.6–5.1)
Alkaline phosphatase (APISO): 63 U/L (ref 40–115)
BUN: 16 mg/dL (ref 7–25)
CO2: 25 mmol/L (ref 20–32)
Calcium: 10 mg/dL (ref 8.6–10.3)
Chloride: 102 mmol/L (ref 98–110)
Creat: 0.86 mg/dL (ref 0.70–1.25)
Globulin: 2.5 g/dL (calc) (ref 1.9–3.7)
Glucose, Bld: 106 mg/dL — ABNORMAL HIGH (ref 65–99)
Potassium: 4.4 mmol/L (ref 3.5–5.3)
Sodium: 137 mmol/L (ref 135–146)
Total Bilirubin: 0.7 mg/dL (ref 0.2–1.2)
Total Protein: 7.2 g/dL (ref 6.1–8.1)

## 2017-02-07 LAB — TSH: TSH: 0.92 mIU/L (ref 0.40–4.50)

## 2017-02-07 LAB — LIPID PANEL
Cholesterol: 217 mg/dL — ABNORMAL HIGH (ref <200)
HDL: 55 mg/dL (ref >40)
LDL Cholesterol (Calc): 138 mg/dL (calc) — ABNORMAL HIGH
Non-HDL Cholesterol (Calc): 162 mg/dL (calc) — ABNORMAL HIGH (ref <130)
Total CHOL/HDL Ratio: 3.9 (calc) (ref <5.0)
Triglycerides: 119 mg/dL (ref <150)

## 2017-02-07 LAB — URIC ACID: Uric Acid, Serum: 5.4 mg/dL (ref 4.0–8.0)

## 2017-02-07 LAB — PSA: PSA: 4.7 ng/mL — ABNORMAL HIGH (ref <=4.0)

## 2017-02-07 LAB — VITAMIN D 25 HYDROXY (VIT D DEFICIENCY, FRACTURES): Vit D, 25-Hydroxy: 29 ng/mL — ABNORMAL LOW (ref 30–100)

## 2017-03-14 ENCOUNTER — Encounter: Payer: Self-pay | Admitting: Medical

## 2017-03-14 ENCOUNTER — Ambulatory Visit (INDEPENDENT_AMBULATORY_CARE_PROVIDER_SITE_OTHER): Payer: Medicare HMO | Admitting: Medical

## 2017-03-14 VITALS — BP 106/68 | HR 64 | Temp 97.8°F | Wt 232.6 lb

## 2017-03-14 DIAGNOSIS — J988 Other specified respiratory disorders: Secondary | ICD-10-CM | POA: Diagnosis not present

## 2017-03-14 DIAGNOSIS — L723 Sebaceous cyst: Secondary | ICD-10-CM | POA: Diagnosis not present

## 2017-03-14 DIAGNOSIS — R05 Cough: Secondary | ICD-10-CM

## 2017-03-14 DIAGNOSIS — R059 Cough, unspecified: Secondary | ICD-10-CM

## 2017-03-14 MED ORDER — HYDROCODONE-HOMATROPINE 5-1.5 MG/5ML PO SYRP: 5.0000 mL | ORAL_SOLUTION | Freq: Three times a day (TID) | ORAL | 0 refills | Status: DC | PRN

## 2017-03-14 MED ORDER — AMOXICILLIN 500 MG PO TABS
ORAL_TABLET | ORAL | 0 refills | Status: DC
Start: 2017-03-14 — End: 2017-04-21

## 2017-03-14 NOTE — Progress Notes (Signed)
Subjective:  Mark Caldwell is a 72 y.o. male who presents for illness.  He reports about a week history of cough, congestion, green nasal drainage and sputum, some sinus pressure and headache.  Has some pressure in the right ear.  Denies body aches or chills, no fever, no nausea vomiting or diarrhea.   He has had some sick contacts.  He has been on airplanes several times in the last 2 weeks around sick people.  Taking DayQuil and NyQuil.  Feels pretty drained.  He also notes an area on his left upper back/behind his shoulder that seems tense and knot like.  Not sure how long it has been there but he just noticed the tension in discomfort in the last few days.  He is leaving town next Wednesday to the [place] for a week.  No other aggravating or relieving factors.  No other c/o.  Past Medical History:  Diagnosis Date  . ED (erectile dysfunction)   . Gout   . Hemorrhoids   . Hypertension    elevated BP.  Marland Kitchen Left inguinal hernia    noted on CT 10/2013  . Partial tear of Achilles tendon summer 2005   left  . Premature ejaculation   . Prostate cancer ([place])    surveillance; Mark Caldwell:12-30-14 prostate CA on 2 biopsies,Gleason for both 3+3=^   . Right rotator cuff tear    Current Outpatient Medications on File Prior to Visit  Medication Sig Dispense Refill  . allopurinol (ZYLOPRIM) 300 MG tablet take 1 tablet by mouth once daily 90 tablet 3  . B Complex-C (SUPER B COMPLEX PO) Take 1 tablet by mouth daily.    . Cholecalciferol (VITAMIN D) 2000 UNITS tablet Take 2,000 Units by mouth daily.    Marland Kitchen DEXILANT 60 MG capsule take 1 capsule by mouth once daily BEFORE SUPPER 30 capsule 5  . lisinopril-hydrochlorothiazide (PRINZIDE,ZESTORETIC) 20-25 MG tablet take 1 tablet by mouth once daily 90 tablet 3  . Lorcaserin HCl ER (BELVIQ XR) 20 MG TB24 Take 1 tablet by mouth daily. 30 tablet 2  . Multiple Vitamins-Minerals (CENTRUM ADULTS PO) Take 1 tablet by mouth daily.    . sildenafil (VIAGRA) 100  MG tablet take 1/2 to 1 tablet by mouth once daily if needed for ERECTILE DYSFUNCTION 15 tablet 11  . tadalafil (CIALIS) 20 MG tablet Take 0.5-1 tablets (10-20 mg total) by mouth every other day as needed for erectile dysfunction. 20 tablet 11  . tamsulosin (FLOMAX) 0.4 MG CAPS capsule Take 0.4 mg by mouth daily.    Marland Kitchen zolpidem (AMBIEN) 10 MG tablet Take 1 tablet (10 mg total) by mouth at bedtime as needed for sleep. 20 tablet 0  . colchicine 0.6 MG tablet Take 2 tablets at onset of gout flare. Repeat in 1 hour, if needed 30 tablet 0   No current facility-administered medications on file prior to visit.    ROS as in subjective   Objective: BP 106/68   Pulse 64   Temp 97.8 F (36.6 C)   Wt 232 lb 9.6 oz (105.5 kg)   SpO2 96%   BMI 30.69 kg/m   General appearance: Alert, WD/WN, no distress                             Skin: warm, no rash  Head: + frontal sinus tenderness,                            Eyes: conjunctiva normal, corneas clear, PERRLA                            Ears: right TM with mild erythema, serous fluid behind TM, small anterior polyp, external ear canals normal                          Nose: septum midline, turbinates swollen, with erythema and mucoid discharge             Mouth/throat: MMM, tongue normal, mild pharyngeal erythema                           Neck: supple, no adenopathy, no thyromegaly, non tender                         Lungs: CTA bilaterally, no wheezes, rales, or rhonchi    Skin: Left upper back laterally with approximately 2.5 cm round tense somewhat mildly erythematous lesion with a central pore suggestive of inflamed sebaceous cyst.  No fluctuance, mildly tender.  No drainage.    Assessment  Encounter Diagnoses  Name Primary?  Marland Kitchen Respiratory tract infection Yes  . Cough   . Sebaceous cyst       Plan: Respiratory tract infection, cough-begin amoxicillin, rest, hydrate well, can use Hycodan syrup nightly as needed, can  continue DayQuil, and call or return if not much improved in the next 3-4 days.  Sebaceous cyst-we discussed the current findings would suggest inflamed sebaceous cyst but not obvious infection or fluctuance.  We discussed possible treatment options.  I recommend he use hot compresses and the antibiotic which we are starting for the respiratory tract infection.  Hopefully this will help calm down the inflammation.  We discussed possibly needing to do incision and drainage if he gets worse, but in light of him going to the [place] next week I&D  Procedure today probably is not the best option since it is noninfected and just mildly inflamed  [name] was seen today for possible sinus infection.  Diagnoses and all orders for this visit:  Respiratory tract infection  Cough  Sebaceous cyst  Other orders -     amoxicillin (AMOXIL) 500 MG tablet; 2 tablets po BID x 10 days -     HYDROcodone-homatropine (HYCODAN) 5-1.5 MG/5ML syrup; Take 5 mLs by mouth every 8 (eight) hours as needed for cough.   Patient was advised to call or return if worse or not improving in the next few days.    Patient voiced understanding of diagnosis, recommendations, and treatment plan.  After visit summary given.

## 2017-03-25 DIAGNOSIS — M545 Low back pain: Secondary | ICD-10-CM | POA: Diagnosis not present

## 2017-04-05 DIAGNOSIS — J111 Influenza due to unidentified influenza virus with other respiratory manifestations: Secondary | ICD-10-CM | POA: Diagnosis not present

## 2017-04-05 DIAGNOSIS — R05 Cough: Secondary | ICD-10-CM | POA: Diagnosis not present

## 2017-04-05 DIAGNOSIS — J988 Other specified respiratory disorders: Secondary | ICD-10-CM | POA: Diagnosis not present

## 2017-04-05 DIAGNOSIS — R509 Fever, unspecified: Secondary | ICD-10-CM | POA: Diagnosis not present

## 2017-04-07 DIAGNOSIS — C61 Malignant neoplasm of prostate: Secondary | ICD-10-CM | POA: Diagnosis not present

## 2017-04-14 DIAGNOSIS — C61 Malignant neoplasm of prostate: Secondary | ICD-10-CM | POA: Diagnosis not present

## 2017-04-14 DIAGNOSIS — N5201 Erectile dysfunction due to arterial insufficiency: Secondary | ICD-10-CM | POA: Diagnosis not present

## 2017-04-20 NOTE — Progress Notes (Signed)
Chief Complaint  Patient presents with  . Follow-up    on Belviq.     Patient presents for f/u on weight loss with Belviq. This was started in mid-December, after his physical. He had dry mouth initially, resolved. Bowel schedule has been off--not regular, sometimes skips a day, but not straining or constipated. He got a trainer who is a Engineer, maintenance (IT).  Going to the gym more often (has been home), and eating better.  Workout is more challenging. Cut back significantly on drinking, cut back on carbs/white foods. Tries to eat 2/3 of what he ate before.  He doesn't feel hungry or deprived.  Costs $300/month, coupon through [employer] for $25 or 30.  Cost has been a motivating factor to try and not "eat through" any benefit of the medication.  He has been home more recently, which has made it easier. The challenge will be when he is traveling more, in [place].  BP's have been running 110-132/70's.  Had the flu while he was in the [place], took Tamiflu.  Had to come back early.  Lingered a while, but completely resolved. Strained his lower back.  Used muscle relaxants and worked with his Clinical research associate. Back pain resolved  PMH, PSH, SH reviewed  Outpatient Encounter Medications as of 04/21/2017  Medication Sig Note  . allopurinol (ZYLOPRIM) 300 MG tablet take 1 tablet by mouth once daily   . B Complex-C (SUPER B COMPLEX PO) Take 1 tablet by mouth daily. 02/05/2016: Takes sporadically  . Cholecalciferol (VITAMIN D) 2000 UNITS tablet Take 2,000 Units by mouth daily. 02/06/2017: Takes sporadically, 2x/week  . DEXILANT 60 MG capsule take 1 capsule by mouth once daily BEFORE SUPPER 02/06/2017: Rarely needs, 1x/month  . lisinopril-hydrochlorothiazide (PRINZIDE,ZESTORETIC) 20-25 MG tablet take 1 tablet by mouth once daily   . Lorcaserin HCl ER (BELVIQ XR) 20 MG TB24 Take 1 tablet by mouth daily.   . Multiple Vitamins-Minerals (CENTRUM ADULTS PO) Take 1 tablet by mouth daily. 02/05/2016: Takes very sporadically   . sildenafil (VIAGRA) 100 MG tablet take 1/2 to 1 tablet by mouth once daily if needed for ERECTILE DYSFUNCTION   . tadalafil (CIALIS) 20 MG tablet Take 0.5-1 tablets (10-20 mg total) by mouth every other day as needed for erectile dysfunction.   . tamsulosin (FLOMAX) 0.4 MG CAPS capsule Take 0.4 mg by mouth daily.   Marland Kitchen zolpidem (AMBIEN) 10 MG tablet Take 1 tablet (10 mg total) by mouth at bedtime as needed for sleep. 02/06/2017: Only when traveling overseas  . [DISCONTINUED] Lorcaserin HCl ER (BELVIQ XR) 20 MG TB24 Take 1 tablet by mouth daily.   . colchicine 0.6 MG tablet Take 2 tablets at onset of gout flare. Repeat in 1 hour, if needed (Patient not taking: Reported on 04/21/2017)   . cyclobenzaprine (FLEXERIL) 10 MG tablet TAKE 1 TABLET BY MOUTH EVERY 6 TO 8 HOURS   . [DISCONTINUED] amoxicillin (AMOXIL) 500 MG tablet 2 tablets po BID x 10 days   . [DISCONTINUED] HYDROcodone-homatropine (HYCODAN) 5-1.5 MG/5ML syrup Take 5 mLs by mouth every 8 (eight) hours as needed for cough.    No facility-administered encounter medications on file as of 04/21/2017.    No Known Allergies  ROS: no fever, chills, URI symptoms, back pain, nausea, vomiting.  Slight change in bowels but no diarrhea, constipation or blood.  No joint pains, moods are good, no other complaints.   PHYSICAL EXAM:  BP 124/72   Pulse 72   Ht 6\' 1"  (1.854 m)   Wt 224  lb 9.6 oz (101.9 kg)   BMI 29.63 kg/m    Wt Readings from Last 3 Encounters:  04/21/17 224 lb 9.6 oz (101.9 kg)  03/14/17 232 lb 9.6 oz (105.5 kg)  02/06/17 238 lb 3.2 oz (108 kg)    Well appearing ,pleasant male in no distress HEENT: conjunctiva and sclera are clear Neck: no lymphadenopathy or mass Heart: regular rate and rhythm, no murmur Lungs: clear bilaterally Neuro: alert and oriented, normal strength, gait Psych: normal mood, affect, hygiene and grooming  ASSESSMENT/PLAN:  Essential hypertension, benign - well controlled  Obesity (BMI 30-39.9) -  is now <30; reinforced dietary changes when traveling. Congratulated on his significant efforts.  Refilled Belviq x 6 mos.  - Plan: Lorcaserin HCl ER (BELVIQ XR) 20 MG TB24   F/u at an earlier CPE (November, ok with pt's insurance, per pt). Sooner if any issues. Refill x 6 mos and expect a message when needed with an updated weight (MyChart)

## 2017-04-21 ENCOUNTER — Ambulatory Visit (INDEPENDENT_AMBULATORY_CARE_PROVIDER_SITE_OTHER): Payer: Medicare HMO | Admitting: Family Medicine

## 2017-04-21 ENCOUNTER — Encounter: Payer: Self-pay | Admitting: Family Medicine

## 2017-04-21 VITALS — BP 124/72 | HR 72 | Ht 73.0 in | Wt 224.6 lb

## 2017-04-21 DIAGNOSIS — I10 Essential (primary) hypertension: Secondary | ICD-10-CM

## 2017-04-21 DIAGNOSIS — E669 Obesity, unspecified: Secondary | ICD-10-CM

## 2017-04-21 MED ORDER — LORCASERIN HCL ER 20 MG PO TB24: 1.0000 | ORAL_TABLET | Freq: Every day | ORAL | 5 refills | Status: DC

## 2017-04-21 NOTE — Patient Instructions (Signed)
Keep up the good work! Your blood pressure is excellent

## 2017-05-02 DIAGNOSIS — L821 Other seborrheic keratosis: Secondary | ICD-10-CM | POA: Diagnosis not present

## 2017-05-02 DIAGNOSIS — L723 Sebaceous cyst: Secondary | ICD-10-CM | POA: Diagnosis not present

## 2017-05-02 DIAGNOSIS — D2230 Melanocytic nevi of unspecified part of face: Secondary | ICD-10-CM | POA: Diagnosis not present

## 2017-05-02 DIAGNOSIS — D225 Melanocytic nevi of trunk: Secondary | ICD-10-CM | POA: Diagnosis not present

## 2017-05-02 DIAGNOSIS — L570 Actinic keratosis: Secondary | ICD-10-CM | POA: Diagnosis not present

## 2017-05-02 DIAGNOSIS — D171 Benign lipomatous neoplasm of skin and subcutaneous tissue of trunk: Secondary | ICD-10-CM | POA: Diagnosis not present

## 2017-05-02 DIAGNOSIS — Z23 Encounter for immunization: Secondary | ICD-10-CM | POA: Diagnosis not present

## 2017-06-19 ENCOUNTER — Telehealth: Payer: Self-pay | Admitting: *Deleted

## 2017-06-19 MED ORDER — ALLOPURINOL 100 MG PO TABS: 100.0000 mg | ORAL_TABLET | Freq: Every day | ORAL | 0 refills | Status: DC

## 2017-06-19 NOTE — Telephone Encounter (Signed)
Patient advised and rx sent. He states that he will call back to schedule, he was driving.

## 2017-06-19 NOTE — Telephone Encounter (Signed)
His last level (in December) was actually good, (uric acid <6, at 5.4).  I suspect his diet when traveling is likely contributing (?).  He should NOT go up on allopurinol during an acute flare, as this will make it worse.   If he doesn't want to take the colchicine, he can take ibuprofen 800mg  three times daily with food until he can take colchicine, if needed. He can be rx'd a separate 100mg  dose to take along with the 300mg .  Needs uric acid level checked within 3 months, with OV

## 2017-06-19 NOTE — Telephone Encounter (Signed)
Patient called and has been having recurrent gout attacks in his hands. Said that he spoke to you about going up on his allopurinol from 300 to possibly 400mg  or 600mg  daily. He is ready to do so as you feel necessary. He did take some colchicine last week and it helped. He is having another flare today but does not want to take colchicine today or tomorrow as he will be travelling. Is it okay to go up on the dose of allopurinol while having an attack? He uses [facility] [address].

## 2017-06-30 ENCOUNTER — Encounter: Payer: Self-pay | Admitting: Family Medicine

## 2017-06-30 ENCOUNTER — Ambulatory Visit (INDEPENDENT_AMBULATORY_CARE_PROVIDER_SITE_OTHER): Payer: Medicare HMO | Admitting: Family Medicine

## 2017-06-30 VITALS — BP 110/70 | HR 64 | Ht 73.0 in | Wt 225.8 lb

## 2017-06-30 DIAGNOSIS — M25532 Pain in left wrist: Secondary | ICD-10-CM

## 2017-06-30 DIAGNOSIS — M109 Gout, unspecified: Secondary | ICD-10-CM | POA: Diagnosis not present

## 2017-06-30 NOTE — Patient Instructions (Signed)
  Wear an over-the-counter carpal tunnel wrist brace to allow for some rest/immobilization to the wrist and tendons and allow it to heal. Continue the ibuprofen three times daily with food until pain resolved. Cut back on the dose if any abdominal pain develops. Consider taking ibuprofen prior to riding the motorcycle to see if that helps prevent any of the pain/swelling (and continue use if it still hurts/swells).  Return as scheduled in July for the blood test for the gout. With next car trip, try and drink more water and/or limit the foods that can trigger gout.

## 2017-06-30 NOTE — Progress Notes (Signed)
Chief Complaint  Patient presents with  . Hand Pain    left wrist and hand pain since Sat am. Did ride his motorcycle Fri and left hand is his "clutch hand".    Patient presents with complaint of left wrist and hand pain. He rides his motorcycle in the spring, just recently started riding again, rode 3 hours in the last week.  He is having pain and swelling at his left wrist Hurt to squeeze the clutch, hurt to hold coffee cup, felt weak, due to pain.. Left wrist pain wakes him up night. Leans on the wrist while riding. Denies a lot of vibration with his bike. Denies numbness/tingling.  Currently has discomfort in the anterior left wrist, into the palm  He eats and writes with left hand, does everything else right, mixed dominant.  He feels this is separate from gout, which he also recently contacted [place] about, having had some pain in his thumbs which he relates to gout.  His allopurinol dose was recently increased from 300 to 400mg  after last flare.  First flare in thumb was over Thanksgiving, pain in the left thumb.  Second time was 4-6 weeks ago that he had right thumb pain. It was sore to touch, didn't particularly look swollen, slightly puffy.  Hurt to start his car.  Got colchicine, narcotic and prednisone when in [place] the first time.  With last flare he did end up taking colchicine along with 800mg  ibuprofen, was better within a day. He has been taking 400mg  (300 + 100) Set up for lab visit for 7/15 to recheck uric acid levels (which was under 6 on last check). He states it flares with travel--not drinking enough during long drives, poss dietary changes.  PMH, PSH, SH reviewed  Outpatient Encounter Medications as of 06/30/2017  Medication Sig Note  . allopurinol (ZYLOPRIM) 100 MG tablet Take 1 tablet (100 mg total) by mouth daily.   Marland Kitchen allopurinol (ZYLOPRIM) 300 MG tablet take 1 tablet by mouth once daily   . B Complex-C (SUPER B COMPLEX PO) Take 1 tablet by mouth daily. 02/05/2016:  Takes sporadically  . Cholecalciferol (VITAMIN D) 2000 UNITS tablet Take 2,000 Units by mouth daily. 02/06/2017: Takes sporadically, 2x/week  . lisinopril-hydrochlorothiazide (PRINZIDE,ZESTORETIC) 20-25 MG tablet take 1 tablet by mouth once daily   . Lorcaserin HCl ER (BELVIQ XR) 20 MG TB24 Take 1 tablet by mouth daily.   . Multiple Vitamins-Minerals (CENTRUM ADULTS PO) Take 1 tablet by mouth daily. 02/05/2016: Takes very sporadically  . sildenafil (VIAGRA) 100 MG tablet take 1/2 to 1 tablet by mouth once daily if needed for ERECTILE DYSFUNCTION   . tamsulosin (FLOMAX) 0.4 MG CAPS capsule Take 0.4 mg by mouth daily.   . colchicine 0.6 MG tablet Take 2 tablets at onset of gout flare. Repeat in 1 hour, if needed (Patient not taking: Reported on 04/21/2017)   . cyclobenzaprine (FLEXERIL) 10 MG tablet TAKE 1 TABLET BY MOUTH EVERY 6 TO 8 HOURS   . DEXILANT 60 MG capsule take 1 capsule by mouth once daily BEFORE SUPPER (Patient not taking: Reported on 06/30/2017) 02/06/2017: Rarely needs, 1x/month  . tadalafil (CIALIS) 20 MG tablet Take 0.5-1 tablets (10-20 mg total) by mouth every other day as needed for erectile dysfunction. (Patient not taking: Reported on 06/30/2017)   . zolpidem (AMBIEN) 10 MG tablet Take 1 tablet (10 mg total) by mouth at bedtime as needed for sleep. (Patient not taking: Reported on 06/30/2017) 02/06/2017: Only when traveling overseas  No facility-administered encounter medications on file as of 06/30/2017.    No Known Allergies  ROS: no fever, chills, rash, bleeding, bruising, GI complaints. No neck pain or other joint pains just left UE as described in HPI. No weakness, just some giving way when painful. No chest pain, URI or other complaints  PHYSICAL EXAM:  BP 110/70   Pulse 64   Ht 6\' 1"  (1.854 m)   Wt 225 lb 12.8 oz (102.4 kg)   BMI 29.79 kg/m   Well appearing male in no distress LUE: normal pulses, brisk cap refill Negative phalen/tinel Had discomfort with left thumb  flexion against resistance, also with extension, though discomfort was in different locations of hand Negative Finkleman's, no tenderness along tendons of thumb. Pain with wrist extension against resistance (at dorsum of wrist)  No significant swelling or warmth noted Normal strength, sensation Slight decreased ROM due to pain  ASSESSMENT/PLAN:  Left wrist pain - suspect overuse/strain. NSAIDs, rest, wrist brace (when not riding). f/u if sx persist/worsen  Gout of foot, unspecified cause, unspecified chronicity, unspecified laterality - with recent gout in thumbs. Cont 400mg  allopurinol and f/u as sched for labs. Encouraged proper diet/hydration with travel    Left wrist/hand/thumb pain--related to recently driving motorcycle. Suspect strain/overuse, possible early CTS  To wear wrist brace and use ibuprofen until pain resolved. To take ibuprofen prior to riding next time    Wear an over-the-counter carpal tunnel wrist brace to allow for some rest/immobilization to the wrist and tendons and allow it to heal. Continue the ibuprofen three times daily with food until pain resolved. Cut back on the dose if any abdominal pain develops. Consider taking ibuprofen prior to riding the motorcycle to see if that helps prevent any of the pain/swelling (and continue use if it still hurts/swells).  Return as scheduled in July for the blood test for the gout. With next car trip, try and drink more water and/or limit the foods that can trigger gout.

## 2017-07-01 ENCOUNTER — Encounter: Payer: Self-pay | Admitting: Family Medicine

## 2017-07-07 DIAGNOSIS — M25532 Pain in left wrist: Secondary | ICD-10-CM | POA: Diagnosis not present

## 2017-07-07 DIAGNOSIS — M67912 Unspecified disorder of synovium and tendon, left shoulder: Secondary | ICD-10-CM | POA: Diagnosis not present

## 2017-07-28 ENCOUNTER — Other Ambulatory Visit: Payer: Self-pay

## 2017-07-28 DIAGNOSIS — N529 Male erectile dysfunction, unspecified: Secondary | ICD-10-CM

## 2017-07-28 MED ORDER — TADALAFIL 20 MG PO TABS: 10.0000 mg | ORAL_TABLET | ORAL | 2 refills | Status: DC | PRN

## 2017-07-28 NOTE — Telephone Encounter (Signed)
Pt calle stating he wanted a refill on this Tidalafil 20mg 

## 2017-07-30 ENCOUNTER — Other Ambulatory Visit: Payer: Self-pay | Admitting: *Deleted

## 2017-07-30 MED ORDER — SILDENAFIL CITRATE 20 MG PO TABS: 20.0000 mg | ORAL_TABLET | Freq: Three times a day (TID) | ORAL | 0 refills | Status: DC

## 2017-09-07 NOTE — Progress Notes (Signed)
Chief Complaint  Patient presents with  . Follow-up    2 month follow on med change, fasting.    Patient presents for f/u on gout. He was havng problems in April with flares, related to long drives. His uric acid level on last check had been <6, but likely had changes in water intake and dietary changes related to travel.  We increased his allopurinol dose from 300mg  to 400mg  and is due for uric acid recheck today.  He hasn't had any long trips since then, but has many planned this summer ([place], [place]).  Seen recently with left wrist and hand pain, felt to be strain/overuse related to riding motorcycle.  Pain isn't gone, but is better. Sometimes watch gets tight. Sometimes can't open a jar due to pain, but is intermittent. He had x-rays from Dr. Rhona Raider shortly after last visit here, mentioned some arthritis (no records received). He reports being treated with 5 days of prednisone.  Obesity:  f/u on weight loss with Belviq. This was started in mid-December, after his physical. He is tolerating this without side effect (had dry mouth initially, resolved.) Per February visit: He got a trainer who is a Engineer, maintenance (IT).  Going to the gym more often (has been home), and eating better.  Workout is more challenging. Cut back significantly on drinking, cut back on carbs/white foods. Tries to eat 2/3 of what he ate before.  He doesn't feel hungry or deprived.  Since that visit, he reports he stopped working with the trainer "she was hurting me". Goes to the gym every day he is home. No longer having back pain like he did when working with her.  Has been doing well since last visit, related to being home more, so being able to go to the gym regularly and eating better (not out as often).  Travel this summer is more recreational.  Hard to eat right and exercise while on vacation.    He mentioned that he and his wife will be taking a trip to [place] in the Fall  PMH, [facility], [facility] reviewed  Outpatient  Encounter Medications as of 09/08/2017  Medication Sig Note  . allopurinol (ZYLOPRIM) 100 MG tablet Take 1 tablet (100 mg total) by mouth daily.   Marland Kitchen allopurinol (ZYLOPRIM) 300 MG tablet take 1 tablet by mouth once daily   . B Complex-C (SUPER B COMPLEX PO) Take 1 tablet by mouth daily. 02/05/2016: Takes sporadically  . Cholecalciferol (VITAMIN D) 2000 UNITS tablet Take 2,000 Units by mouth daily. 02/06/2017: Takes sporadically, 2x/week  . lisinopril-hydrochlorothiazide (PRINZIDE,ZESTORETIC) 20-25 MG tablet take 1 tablet by mouth once daily   . Lorcaserin HCl ER (BELVIQ XR) 20 MG TB24 Take 1 tablet by mouth daily.   . Multiple Vitamins-Minerals (CENTRUM ADULTS PO) Take 1 tablet by mouth daily. 02/05/2016: Takes very sporadically  . sildenafil (REVATIO) 20 MG tablet Take 1 tablet (20 mg total) by mouth 3 (three) times daily.   . tamsulosin (FLOMAX) 0.4 MG CAPS capsule Take 0.4 mg by mouth daily.   . [DISCONTINUED] allopurinol (ZYLOPRIM) 100 MG tablet Take 1 tablet (100 mg total) by mouth daily.   . [DISCONTINUED] Lorcaserin HCl ER (BELVIQ XR) 20 MG TB24 Take 1 tablet by mouth daily.   . colchicine 0.6 MG tablet Take 2 tablets at onset of gout flare. Repeat in 1 hour, if needed (Patient not taking: Reported on 04/21/2017)   . cyclobenzaprine (FLEXERIL) 10 MG tablet TAKE 1 TABLET BY MOUTH EVERY 6 TO 8 HOURS   .  DEXILANT 60 MG capsule take 1 capsule by mouth once daily BEFORE SUPPER (Patient not taking: Reported on 06/30/2017) 02/06/2017: Rarely needs, 1x/month  . sildenafil (VIAGRA) 100 MG tablet take 1/2 to 1 tablet by mouth once daily if needed for ERECTILE DYSFUNCTION (Patient not taking: Reported on 09/08/2017)   . zolpidem (AMBIEN) 10 MG tablet Take 1 tablet (10 mg total) by mouth at bedtime as needed for sleep. (Patient not taking: Reported on 09/08/2017) 02/06/2017: Only when traveling overseas   No facility-administered encounter medications on file as of 09/08/2017.    No Known Allergies  ROS:  no fever, chills, URI symptoms.  Some hand/wrist pain as per HPI, not currently flaring.  No gout symptoms.  No GI complaints, chest pain, shortness of breath, bleeding, bruising, rash or other complaints. See HPI.    PHYSICAL EXAM:  BP 118/70   Pulse 64   Ht 5' 11.5" (1.816 m)   Wt 220 lb 6.4 oz (100 kg)   BMI 30.31 kg/m    Wt Readings from Last 3 Encounters:  09/08/17 220 lb 6.4 oz (100 kg)  06/30/17 225 lb 12.8 oz (102.4 kg)  04/21/17 224 lb 9.6 oz (101.9 kg)   Well appearing, pleasant male in no distress HEENT: conjunctiva and sclera clear, EOMI Neck: no lymphadenopathy, thyromegaly or mass Heart: regular rate and rhythm Lungs: clear bilaterally Abdomen: soft, nontender, no mass Extremities: no edema, normal pulses.  No wrist swelling Skin: normal turgor, no visible rash Psych: normal mood, affect, hygiene and grooming Neuro: alert and oriented, cranial nerves intact, normal gait    ASSESSMENT/PLAN:  Gout of foot, unspecified cause, unspecified chronicity, unspecified laterality - recheck uric acid; if well below 6, can consider decreasing back to 300mg  after long travel this summer (use the add'l 100mg  prn prior to long trips) - Plan: allopurinol (ZYLOPRIM) 100 MG tablet, Uric Acid  Obesity (BMI 30-39.9) - continuing to lose wt on Belviq; continue; counseled re: diet and exercise on vacation - Plan: Lorcaserin HCl ER (BELVIQ XR) 20 MG TB24  Medication monitoring encounter - Plan: Uric Acid  He should look into his travel (told about CDC website) for [place]--he may need travel clinic vs coming here.  He previously traveled some, thinks he may have gotten a hep A injection, but doubt he ever got a second. Discussed the information that I would need in order to determine if we can do his travel visit here vs needing to go elsewhere, depending on immunizations needed.  He will look into this and let us know.  Congratulated on his continued weight loss, and encouraged  continued proper diet, exercise and medication

## 2017-09-08 ENCOUNTER — Ambulatory Visit (INDEPENDENT_AMBULATORY_CARE_PROVIDER_SITE_OTHER): Payer: Medicare HMO | Admitting: Family Medicine

## 2017-09-08 ENCOUNTER — Encounter: Payer: Self-pay | Admitting: Family Medicine

## 2017-09-08 VITALS — BP 118/70 | HR 64 | Ht 71.5 in | Wt 220.4 lb

## 2017-09-08 DIAGNOSIS — E669 Obesity, unspecified: Secondary | ICD-10-CM | POA: Diagnosis not present

## 2017-09-08 DIAGNOSIS — M109 Gout, unspecified: Secondary | ICD-10-CM | POA: Diagnosis not present

## 2017-09-08 DIAGNOSIS — Z5181 Encounter for therapeutic drug level monitoring: Secondary | ICD-10-CM

## 2017-09-08 MED ORDER — ALLOPURINOL 100 MG PO TABS: 100.0000 mg | ORAL_TABLET | Freq: Every day | ORAL | 0 refills | Status: DC

## 2017-09-08 MED ORDER — LORCASERIN HCL ER 20 MG PO TB24: 1.0000 | ORAL_TABLET | Freq: Every day | ORAL | 5 refills | Status: DC

## 2017-09-08 NOTE — Patient Instructions (Signed)
Safe travels! Your blood pressure was excellent, and weight continues to go in the right direction.  Since you have a lot of upcoming travel, I would continue the 100mg  of allopurinol in addition to the 300mg  through the summer.  Depending on what the uric acid level is from today, I'll let you know if you can decrease the dose back to 300mg  after all of the long trips.  Look into your [place] trip to see if you need to schedule a visit with a travel clinic or Health Department for vaccines. Some we can do here, but not all. I'm happy to take a look at this if you send me a message with your destinations.

## 2017-09-09 LAB — URIC ACID: Uric Acid: 5.7 mg/dL (ref 3.7–8.6)

## 2017-09-12 ENCOUNTER — Other Ambulatory Visit: Payer: Self-pay | Admitting: Urology

## 2017-09-12 DIAGNOSIS — C61 Malignant neoplasm of prostate: Secondary | ICD-10-CM

## 2017-09-22 ENCOUNTER — Inpatient Hospital Stay
Admission: RE | Admit: 2017-09-22 | Discharge: 2017-09-22 | Disposition: A | Discharge status: Home / Self Care | Payer: Medicare Other | Source: Ambulatory Visit | Attending: Urology | Admitting: Urology

## 2017-10-25 ENCOUNTER — Other Ambulatory Visit: Payer: Self-pay | Admitting: Family Medicine

## 2017-10-25 DIAGNOSIS — E669 Obesity, unspecified: Secondary | ICD-10-CM

## 2017-10-27 ENCOUNTER — Other Ambulatory Visit: Payer: Self-pay | Admitting: Family Medicine

## 2017-10-28 NOTE — Telephone Encounter (Signed)
Is this okay to refill? 

## 2017-10-28 NOTE — Telephone Encounter (Signed)
It was written at his 7/15 visit with 5 refills. It is possible that because it is controlled, that they only allow 3 months at a time, but should still have a refill remaining. Please check with pharm

## 2017-10-28 NOTE — Telephone Encounter (Signed)
They have the rx on file and does not need an rx for this

## 2017-10-28 NOTE — Telephone Encounter (Signed)
Is this ok to refill?  

## 2017-10-29 ENCOUNTER — Other Ambulatory Visit: Payer: Self-pay | Admitting: Family Medicine

## 2017-10-29 DIAGNOSIS — E669 Obesity, unspecified: Secondary | ICD-10-CM

## 2017-11-08 DIAGNOSIS — R69 Illness, unspecified: Secondary | ICD-10-CM | POA: Diagnosis not present

## 2017-11-10 DIAGNOSIS — C61 Malignant neoplasm of prostate: Secondary | ICD-10-CM | POA: Diagnosis not present

## 2017-11-25 DIAGNOSIS — D485 Neoplasm of uncertain behavior of skin: Secondary | ICD-10-CM | POA: Diagnosis not present

## 2017-11-25 DIAGNOSIS — D171 Benign lipomatous neoplasm of skin and subcutaneous tissue of trunk: Secondary | ICD-10-CM | POA: Diagnosis not present

## 2017-11-25 DIAGNOSIS — L820 Inflamed seborrheic keratosis: Secondary | ICD-10-CM | POA: Diagnosis not present

## 2017-11-25 DIAGNOSIS — L570 Actinic keratosis: Secondary | ICD-10-CM | POA: Diagnosis not present

## 2017-11-25 DIAGNOSIS — D225 Melanocytic nevi of trunk: Secondary | ICD-10-CM | POA: Diagnosis not present

## 2017-11-25 DIAGNOSIS — Z23 Encounter for immunization: Secondary | ICD-10-CM | POA: Diagnosis not present

## 2017-11-25 DIAGNOSIS — D2230 Melanocytic nevi of unspecified part of face: Secondary | ICD-10-CM | POA: Diagnosis not present

## 2017-12-10 ENCOUNTER — Other Ambulatory Visit: Payer: Self-pay | Admitting: Family Medicine

## 2017-12-10 DIAGNOSIS — M109 Gout, unspecified: Secondary | ICD-10-CM

## 2018-01-02 DIAGNOSIS — H5202 Hypermetropia, left eye: Secondary | ICD-10-CM | POA: Diagnosis not present

## 2018-01-02 DIAGNOSIS — H524 Presbyopia: Secondary | ICD-10-CM | POA: Diagnosis not present

## 2018-01-02 DIAGNOSIS — H52221 Regular astigmatism, right eye: Secondary | ICD-10-CM | POA: Diagnosis not present

## 2018-01-12 ENCOUNTER — Ambulatory Visit: Payer: Medicare HMO | Admitting: Family Medicine

## 2018-01-27 ENCOUNTER — Other Ambulatory Visit: Payer: Self-pay | Admitting: Family Medicine

## 2018-01-27 DIAGNOSIS — N529 Male erectile dysfunction, unspecified: Secondary | ICD-10-CM

## 2018-01-27 NOTE — Telephone Encounter (Signed)
Is this okay to refill? 

## 2018-01-29 ENCOUNTER — Ambulatory Visit: Payer: Medicare HMO | Admitting: Family Medicine

## 2018-01-29 DIAGNOSIS — H3561 Retinal hemorrhage, right eye: Secondary | ICD-10-CM | POA: Diagnosis not present

## 2018-01-29 DIAGNOSIS — H43392 Other vitreous opacities, left eye: Secondary | ICD-10-CM | POA: Diagnosis not present

## 2018-01-29 DIAGNOSIS — H4311 Vitreous hemorrhage, right eye: Secondary | ICD-10-CM | POA: Diagnosis not present

## 2018-01-29 DIAGNOSIS — H43813 Vitreous degeneration, bilateral: Secondary | ICD-10-CM | POA: Diagnosis not present

## 2018-01-30 ENCOUNTER — Other Ambulatory Visit: Payer: Self-pay | Admitting: Family Medicine

## 2018-01-30 DIAGNOSIS — M109 Gout, unspecified: Secondary | ICD-10-CM

## 2018-01-30 DIAGNOSIS — C61 Malignant neoplasm of prostate: Secondary | ICD-10-CM | POA: Diagnosis not present

## 2018-02-06 DIAGNOSIS — M545 Low back pain: Secondary | ICD-10-CM | POA: Diagnosis not present

## 2018-02-06 DIAGNOSIS — H4311 Vitreous hemorrhage, right eye: Secondary | ICD-10-CM | POA: Diagnosis not present

## 2018-02-06 DIAGNOSIS — H3561 Retinal hemorrhage, right eye: Secondary | ICD-10-CM | POA: Diagnosis not present

## 2018-02-06 DIAGNOSIS — H43811 Vitreous degeneration, right eye: Secondary | ICD-10-CM | POA: Diagnosis not present

## 2018-02-24 DIAGNOSIS — M545 Low back pain: Secondary | ICD-10-CM | POA: Diagnosis not present

## 2018-02-26 ENCOUNTER — Encounter: Payer: Self-pay | Admitting: Family Medicine

## 2018-02-27 DIAGNOSIS — M4316 Spondylolisthesis, lumbar region: Secondary | ICD-10-CM | POA: Diagnosis not present

## 2018-03-06 DIAGNOSIS — M4316 Spondylolisthesis, lumbar region: Secondary | ICD-10-CM | POA: Diagnosis not present

## 2018-03-06 DIAGNOSIS — M545 Low back pain: Secondary | ICD-10-CM | POA: Diagnosis not present

## 2018-03-13 ENCOUNTER — Ambulatory Visit (HOSPITAL_COMMUNITY): Payer: Medicare HMO

## 2018-03-13 ENCOUNTER — Other Ambulatory Visit: Payer: Self-pay | Admitting: Family Medicine

## 2018-03-13 ENCOUNTER — Other Ambulatory Visit (HOSPITAL_COMMUNITY): Payer: Self-pay | Admitting: Family Medicine

## 2018-03-13 DIAGNOSIS — R0781 Pleurodynia: Secondary | ICD-10-CM

## 2018-03-13 DIAGNOSIS — R9389 Abnormal findings on diagnostic imaging of other specified body structures: Secondary | ICD-10-CM | POA: Diagnosis not present

## 2018-03-18 ENCOUNTER — Ambulatory Visit (HOSPITAL_COMMUNITY)
Admission: RE | Admit: 2018-03-18 | Discharge: 2018-03-18 | Disposition: A | Discharge status: Home / Self Care | Payer: Medicare HMO | Source: Ambulatory Visit | Attending: Family Medicine | Admitting: Family Medicine

## 2018-03-18 ENCOUNTER — Encounter (HOSPITAL_COMMUNITY): Payer: Self-pay

## 2018-03-18 DIAGNOSIS — R0781 Pleurodynia: Secondary | ICD-10-CM | POA: Diagnosis not present

## 2018-03-18 DIAGNOSIS — R918 Other nonspecific abnormal finding of lung field: Secondary | ICD-10-CM | POA: Diagnosis not present

## 2018-03-19 ENCOUNTER — Encounter: Payer: Self-pay | Admitting: Family Medicine

## 2018-03-19 ENCOUNTER — Ambulatory Visit (INDEPENDENT_AMBULATORY_CARE_PROVIDER_SITE_OTHER): Payer: Medicare HMO | Admitting: Family Medicine

## 2018-03-19 VITALS — BP 120/78 | HR 80 | Temp 97.4°F | Ht 71.5 in | Wt 224.0 lb

## 2018-03-19 DIAGNOSIS — E7800 Pure hypercholesterolemia, unspecified: Secondary | ICD-10-CM

## 2018-03-19 DIAGNOSIS — I25119 Atherosclerotic heart disease of native coronary artery with unspecified angina pectoris: Secondary | ICD-10-CM | POA: Diagnosis not present

## 2018-03-19 DIAGNOSIS — E669 Obesity, unspecified: Secondary | ICD-10-CM | POA: Diagnosis not present

## 2018-03-19 DIAGNOSIS — Z5181 Encounter for therapeutic drug level monitoring: Secondary | ICD-10-CM

## 2018-03-19 DIAGNOSIS — I10 Essential (primary) hypertension: Secondary | ICD-10-CM

## 2018-03-19 MED ORDER — ROSUVASTATIN CALCIUM 10 MG PO TABS: 10.0000 mg | ORAL_TABLET | Freq: Every day | ORAL | 3 refills | Status: DC

## 2018-03-19 NOTE — Progress Notes (Signed)
Chief Complaint  Patient presents with  . Back Pain    last Thursday, sharp near ribs- couldn't even drive. "stopped him in his tracks." [employer] at [employer] last [place] free Sat/Sun(did CXR) spot on right lung that had doctor concerned given dx of shingles or pneumonia. Pt has no symptoms of either except for maybe a slight dry cough.   . Other    scheduled for L spine MRI tonight at [facility].   Back pain started in December (in [place], slipped on a wet floor, pulled something)--couldn't sleep, walk. Saw Dr. Rhona Raider when he returned, treated with prednisone, muscle relaxant, and an injection (steroid shot, IM).  Felt better, pain completely resolved.   12/30 same pain recurred out of the blue, no known injury.  Saw ortho 12/31, more of the same treatment. Symptoms resolved in 2 days.  Due to recurrent pain, MRI was recommended, scheduled for tonight (to see if he would benefit from injection if continued recurrences, per pt).  Went to [place], 12 hour drive back last Wednesday (8 days ago.  He woke up the following morning (1 week ago0 with stabbing pain in his left posterior ribs.  Like a knife is stabbing him between his ribs. Hurt to bend, hurt to move left arm to drive. Hurt when chest pressed on in certain ways, but not painful to touch or for clothing to touch. He never developed rash.  He saw Dr. Rip Harbour at [facility] (he was available Friday).  He reports they did a CXR in the office--reports not available, but states that it looked cloudy on one side, clear on other. Sent for CT scan (see below).  No notes are available for any of these ortho visits.  He reports he was again prescribed prednisone, treated for possible pneumonia (z-pak), and continued on muscle relaxants. By Sunday, he was pain-free (and even on Saturday he was much better). NOT given valtrex (originally stated he was treated for shingles, but was actually given z-pak).  CT  scan: IMPRESSION: 1. No acute findings in the thorax to account for the patient's symptoms. 2. Small pulmonary nodules measuring 5 mm in the right lower lobe, nonspecific, but statistically likely benign. No follow-up needed if patient is low-risk (and has no known or suspected primary neoplasm). Non-contrast chest CT can be considered in 12 months if patient is high-risk. This recommendation follows the consensus statement: Guidelines for Management of Incidental Pulmonary Nodules Detected on CT Images: From the Fleischner Society 2017; Radiology 2017; 284:228-243. 3. Aortic atherosclerosis, in addition to left main and 3 vessel coronary artery disease. Assessment for potential risk factor modification, dietary therapy or pharmacologic therapy may be warranted, if clinically indicated. 4. Hepatic steatosis.  Stopped Belviq due to messing with stomach and bathroom schedule Less active due to back problems. Only recently resumed yoga (4 days). Recent prostate biopsies, 3/12 were cancer, worst Gleason 3+4=7.  PMH, PSH, SH reviewed  Outpatient Encounter Medications as of 03/19/2018  Medication Sig Note  . allopurinol (ZYLOPRIM) 100 MG tablet TAKE 1 TABLET BY MOUTH EVERY DAY   . allopurinol (ZYLOPRIM) 300 MG tablet TAKE 1 TABLET BY MOUTH EVERY DAY   . B Complex-C (SUPER B COMPLEX PO) Take 1 tablet by mouth daily. 02/05/2016: Takes sporadically  . Cholecalciferol (VITAMIN D) 2000 UNITS tablet Take 2,000 Units by mouth daily. 02/06/2017: Takes sporadically, 2x/week  . cyclobenzaprine (FLEXERIL) 10 MG tablet TAKE 1 TABLET BY MOUTH EVERY 6 TO 8 HOURS 03/19/2018: Takes at night  when needed due to sedation  . lisinopril-hydrochlorothiazide (PRINZIDE,ZESTORETIC) 20-25 MG tablet take 1 tablet by mouth once daily   . Multiple Vitamins-Minerals (CENTRUM ADULTS PO) Take 1 tablet by mouth daily. 02/05/2016: Takes very sporadically  . sildenafil (REVATIO) 20 MG tablet Take 2-5 tablets (40-100 mg total) by  mouth daily as needed (erectile dysfunction).   . tamsulosin (FLOMAX) 0.4 MG CAPS capsule Take 0.4 mg by mouth daily.   Marland Kitchen tiZANidine (ZANAFLEX) 4 MG tablet Take 4 mg by mouth every 6 (six) hours as needed for muscle spasms.   . colchicine 0.6 MG tablet Take 2 tablets at onset of gout flare. Repeat in 1 hour, if needed (Patient not taking: Reported on 04/21/2017)   . DEXILANT 60 MG capsule take 1 capsule by mouth once daily BEFORE SUPPER (Patient not taking: Reported on 06/30/2017) 02/06/2017: Rarely needs, 1x/month  . rosuvastatin (CRESTOR) 10 MG tablet Take 1 tablet (10 mg total) by mouth daily.   Marland Kitchen zolpidem (AMBIEN) 10 MG tablet Take 1 tablet (10 mg total) by mouth at bedtime as needed for sleep. (Patient not taking: Reported on 09/08/2017) 02/06/2017: Only when traveling overseas  . [DISCONTINUED] Lorcaserin HCl ER (BELVIQ XR) 20 MG TB24 Take 1 tablet by mouth daily.   . [DISCONTINUED] sildenafil (VIAGRA) 100 MG tablet take 1/2 to 1 tablet by mouth once daily if needed for ERECTILE DYSFUNCTION (Patient not taking: Reported on 09/08/2017)    No facility-administered encounter medications on file as of 03/19/2018.    (NOT taking statin prior to today's visit)  No Known Allergies  ROS:  No fever, chills, URI symptoms.  No further chest pain (left rib) or back pain currently.  No chest pain, shortness of breath, GI or GU complaints.  No bleeding, bruising, rash. Moods are good   PHYSICAL EXAM:  BP 120/78   Pulse 80   Temp (!) 97.4 F (36.3 C) (Tympanic)   Ht 5' 11.5" (1.816 m)   Wt 224 lb (101.6 kg)   BMI 30.81 kg/m    Wt Readings from Last 3 Encounters:  03/19/18 224 lb (101.6 kg)  09/08/17 220 lb 6.4 oz (100 kg)  06/30/17 225 lb 12.8 oz (102.4 kg)   Well appearing, pleasant, obese male in no distress HEENT: conjunctiva and sclera are clear, EOMI Neck: no lymphadenopathy, thyromegaly or carotid bruit Heart: regular rate and rhythm Lungs: clear bilaterally Back: no spinal or CVA  tenderness. No muscle spasm Abdomen: soft, obese, nontender, no mass Extremities: no edema Neuro: alert and oriented, cranial nerves intact, normal gait Psych: normal mood, affect, hygiene and grooming  ASSESSMENT/PLAN:  Atherosclerosis of native coronary artery of native heart with angina pectoris ([place]) - seen on CT; asymptomatic. Start ASA, statin and refer to cardiology - Plan: Lipid panel, Comprehensive metabolic panel, CBC with Differential/Platelet, Hemoglobin A1c, Ambulatory referral to Cardiology, rosuvastatin (CRESTOR) 10 MG tablet  Pure hypercholesterolemia - mild, but given CT findings, statin recommended.  Risks/SE reviewed in detail - Plan: Lipid panel, Ambulatory referral to Cardiology, rosuvastatin (CRESTOR) 10 MG tablet  Medication monitoring encounter - Plan: Comprehensive metabolic panel, CBC with Differential/Platelet, Hemoglobin A1c  Essential hypertension, benign - controlled - Plan: Ambulatory referral to Cardiology  Obesity (BMI 30-39.9) - weight loss encouraged   Enteric coated 9m of aspirin daily is recommended. Statins are recommended--start Crestor Risks/SE reviewed  Refer to Dr. SMarlou PorchReturn for fasting labs c-met, lipids, cbc

## 2018-03-19 NOTE — Patient Instructions (Signed)
Enteric coated 81mg  of aspirin daily is recommended. Start the statin after getting your labs drawn tomorrow. If you develop any muscle side effects, add taking coenzyme Q10 daily along with it. We referred you to Dr. Marlou Porch (cardiology).

## 2018-03-20 ENCOUNTER — Other Ambulatory Visit: Payer: Medicare HMO

## 2018-03-20 DIAGNOSIS — E7800 Pure hypercholesterolemia, unspecified: Secondary | ICD-10-CM | POA: Diagnosis not present

## 2018-03-20 DIAGNOSIS — Z5181 Encounter for therapeutic drug level monitoring: Secondary | ICD-10-CM

## 2018-03-20 DIAGNOSIS — I25119 Atherosclerotic heart disease of native coronary artery with unspecified angina pectoris: Secondary | ICD-10-CM | POA: Diagnosis not present

## 2018-03-21 LAB — CBC WITH DIFFERENTIAL/PLATELET
Basophils Absolute: 0.1 10*3/uL (ref 0.0–0.2)
Basos: 1 %
EOS (ABSOLUTE): 0.3 10*3/uL (ref 0.0–0.4)
Eos: 4 %
Hematocrit: 45.4 % (ref 37.5–51.0)
Hemoglobin: 15.5 g/dL (ref 13.0–17.7)
Immature Grans (Abs): 0.1 10*3/uL (ref 0.0–0.1)
Immature Granulocytes: 2 %
Lymphocytes Absolute: 2.5 10*3/uL (ref 0.7–3.1)
Lymphs: 35 %
MCH: 30.1 pg (ref 26.6–33.0)
MCHC: 34.1 g/dL (ref 31.5–35.7)
MCV: 88 fL (ref 79–97)
Monocytes Absolute: 0.6 10*3/uL (ref 0.1–0.9)
Monocytes: 8 %
Neutrophils Absolute: 3.7 10*3/uL (ref 1.4–7.0)
Neutrophils: 50 %
Platelets: 391 10*3/uL (ref 150–450)
RBC: 5.15 x10E6/uL (ref 4.14–5.80)
RDW: 14.2 % (ref 11.6–15.4)
WBC: 7.3 10*3/uL (ref 3.4–10.8)

## 2018-03-21 LAB — LIPID PANEL
Chol/HDL Ratio: 3.6 ratio (ref 0.0–5.0)
Cholesterol, Total: 192 mg/dL (ref 100–199)
HDL: 53 mg/dL (ref >39)
LDL Calculated: 115 mg/dL — ABNORMAL HIGH (ref 0–99)
Triglycerides: 120 mg/dL (ref 0–149)
VLDL Cholesterol Cal: 24 mg/dL (ref 5–40)

## 2018-03-21 LAB — COMPREHENSIVE METABOLIC PANEL
ALT: 24 IU/L (ref 0–44)
AST: 20 IU/L (ref 0–40)
Albumin/Globulin Ratio: 1.7 (ref 1.2–2.2)
Albumin: 4.5 g/dL (ref 3.8–4.8)
Alkaline Phosphatase: 86 IU/L (ref 39–117)
BUN/Creatinine Ratio: 24 (ref 10–24)
BUN: 23 mg/dL (ref 8–27)
Bilirubin Total: 0.4 mg/dL (ref 0.0–1.2)
CO2: 23 mmol/L (ref 20–29)
Calcium: 10 mg/dL (ref 8.6–10.2)
Chloride: 97 mmol/L (ref 96–106)
Creatinine, Ser: 0.94 mg/dL (ref 0.76–1.27)
GFR calc Af Amer: 95 mL/min/{1.73_m2} (ref >59)
GFR calc non Af Amer: 82 mL/min/{1.73_m2} (ref >59)
Globulin, Total: 2.6 g/dL (ref 1.5–4.5)
Glucose: 102 mg/dL — ABNORMAL HIGH (ref 65–99)
Potassium: 4.8 mmol/L (ref 3.5–5.2)
Sodium: 137 mmol/L (ref 134–144)
Total Protein: 7.1 g/dL (ref 6.0–8.5)

## 2018-03-21 LAB — HEMOGLOBIN A1C
Est. average glucose Bld gHb Est-mCnc: 128 mg/dL
Hgb A1c MFr Bld: 6.1 % — ABNORMAL HIGH (ref 4.8–5.6)

## 2018-03-27 DIAGNOSIS — H43811 Vitreous degeneration, right eye: Secondary | ICD-10-CM | POA: Diagnosis not present

## 2018-03-27 DIAGNOSIS — H2513 Age-related nuclear cataract, bilateral: Secondary | ICD-10-CM | POA: Diagnosis not present

## 2018-03-27 DIAGNOSIS — H3561 Retinal hemorrhage, right eye: Secondary | ICD-10-CM | POA: Diagnosis not present

## 2018-03-27 DIAGNOSIS — H35411 Lattice degeneration of retina, right eye: Secondary | ICD-10-CM | POA: Diagnosis not present

## 2018-03-29 ENCOUNTER — Other Ambulatory Visit: Payer: Self-pay | Admitting: Family Medicine

## 2018-03-29 DIAGNOSIS — I10 Essential (primary) hypertension: Secondary | ICD-10-CM

## 2018-04-18 ENCOUNTER — Other Ambulatory Visit: Payer: Self-pay | Admitting: Family Medicine

## 2018-04-18 DIAGNOSIS — M545 Low back pain: Secondary | ICD-10-CM | POA: Diagnosis not present

## 2018-04-18 DIAGNOSIS — M109 Gout, unspecified: Secondary | ICD-10-CM

## 2018-05-08 ENCOUNTER — Encounter: Payer: Self-pay | Admitting: Cardiology

## 2018-05-14 ENCOUNTER — Other Ambulatory Visit: Payer: Self-pay | Admitting: Family Medicine

## 2018-05-14 DIAGNOSIS — M109 Gout, unspecified: Secondary | ICD-10-CM

## 2018-05-14 DIAGNOSIS — I10 Essential (primary) hypertension: Secondary | ICD-10-CM

## 2018-05-14 DIAGNOSIS — I25119 Atherosclerotic heart disease of native coronary artery with unspecified angina pectoris: Secondary | ICD-10-CM

## 2018-05-14 DIAGNOSIS — E7800 Pure hypercholesterolemia, unspecified: Secondary | ICD-10-CM

## 2018-05-19 ENCOUNTER — Ambulatory Visit: Payer: Medicare HMO | Admitting: Cardiology

## 2018-05-23 ENCOUNTER — Other Ambulatory Visit: Payer: Self-pay | Admitting: Family Medicine

## 2018-05-23 DIAGNOSIS — M109 Gout, unspecified: Secondary | ICD-10-CM

## 2018-05-25 NOTE — Telephone Encounter (Signed)
Is this okay to refill? 

## 2018-05-25 NOTE — Telephone Encounter (Signed)
Left message for patient to return my call.

## 2018-05-25 NOTE — Telephone Encounter (Signed)
I previously wrote for #30.  Came across weird, requesting #4 with 7 refills.  Not sure if that is for insurance reasons or not.  I'd be okay with larger quantity, if covered (ie #15--last rx was for #30 in 01/2017, so lasted so long, probably wouldn't do full #30 or they'll expire). I don't know if he would get upset if he only got 4 (?), so I'd be okay with #15, no refill, and pharmacy can change based on insurance coverage

## 2018-05-27 NOTE — Telephone Encounter (Signed)
Left message again.

## 2018-05-28 ENCOUNTER — Other Ambulatory Visit: Payer: Self-pay | Admitting: *Deleted

## 2018-05-28 DIAGNOSIS — E1169 Type 2 diabetes mellitus with other specified complication: Secondary | ICD-10-CM

## 2018-05-28 DIAGNOSIS — E785 Hyperlipidemia, unspecified: Secondary | ICD-10-CM

## 2018-05-28 DIAGNOSIS — Z5181 Encounter for therapeutic drug level monitoring: Secondary | ICD-10-CM

## 2018-05-28 DIAGNOSIS — R7301 Impaired fasting glucose: Secondary | ICD-10-CM

## 2018-05-28 NOTE — Telephone Encounter (Signed)
Patient is asking for #30, 0 refill.

## 2018-05-29 ENCOUNTER — Other Ambulatory Visit: Payer: Self-pay

## 2018-05-29 ENCOUNTER — Encounter: Payer: Self-pay | Admitting: Family Medicine

## 2018-05-29 ENCOUNTER — Other Ambulatory Visit: Payer: Medicare HMO

## 2018-05-29 DIAGNOSIS — E785 Hyperlipidemia, unspecified: Secondary | ICD-10-CM | POA: Diagnosis not present

## 2018-05-29 DIAGNOSIS — R7301 Impaired fasting glucose: Secondary | ICD-10-CM | POA: Diagnosis not present

## 2018-05-29 DIAGNOSIS — E1169 Type 2 diabetes mellitus with other specified complication: Secondary | ICD-10-CM | POA: Diagnosis not present

## 2018-05-29 DIAGNOSIS — Z5181 Encounter for therapeutic drug level monitoring: Secondary | ICD-10-CM | POA: Diagnosis not present

## 2018-05-29 DIAGNOSIS — C61 Malignant neoplasm of prostate: Secondary | ICD-10-CM | POA: Diagnosis not present

## 2018-05-30 LAB — LIPID PANEL
Chol/HDL Ratio: 2.9 ratio (ref 0.0–5.0)
Cholesterol, Total: 115 mg/dL (ref 100–199)
HDL: 39 mg/dL — ABNORMAL LOW (ref >39)
LDL Calculated: 58 mg/dL (ref 0–99)
Triglycerides: 89 mg/dL (ref 0–149)
VLDL Cholesterol Cal: 18 mg/dL (ref 5–40)

## 2018-05-30 LAB — HEPATIC FUNCTION PANEL
ALT: 18 IU/L (ref 0–44)
AST: 13 IU/L (ref 0–40)
Albumin: 4.4 g/dL (ref 3.8–4.8)
Alkaline Phosphatase: 80 IU/L (ref 39–117)
Bilirubin Total: 0.3 mg/dL (ref 0.0–1.2)
Bilirubin, Direct: 0.14 mg/dL (ref 0.00–0.40)
Total Protein: 6.8 g/dL (ref 6.0–8.5)

## 2018-05-30 LAB — GLUCOSE, RANDOM: Glucose: 104 mg/dL — ABNORMAL HIGH (ref 65–99)

## 2018-06-02 NOTE — Patient Instructions (Addendum)
  HEALTH MAINTENANCE RECOMMENDATIONS:  It is recommended that you get at least 30 minutes of aerobic exercise at least 5 days/week (for weight loss, you may need as much as 60-90 minutes). This can be any activity that gets your heart rate up. This can be divided in 10-15 minute intervals if needed, but try and build up your endurance at least once a week.  Weight bearing exercise is also recommended twice weekly.  Eat a healthy diet with lots of vegetables, fruits and fiber.  "Colorful" foods have a lot of vitamins (ie green vegetables, tomatoes, red peppers, etc).  Limit sweet tea, regular sodas and alcoholic beverages, all of which has a lot of calories and sugar.  Up to 2 alcoholic drinks daily may be beneficial for men (unless trying to lose weight, watch sugars).  Drink a lot of water.  Sunscreen of at least SPF 30 should be used on all sun-exposed parts of the skin when outside between the hours of 10 am and 4 pm (not just when at beach or pool, but even with exercise, golf, tennis, and yard work!)  Use a sunscreen that says "broad spectrum" so it covers both UVA and UVB rays, and make sure to reapply every 1-2 hours.  Remember to change the batteries in your smoke detectors when changing your clock times in the spring and fall. Carbon monoxide detectors are recommended for your home.  Use your seat belt every time you are in a car, and please drive safely and not be distracted with cell phones and texting while driving.   Mr. Osei , Thank you for taking time to come for your Medicare Wellness Visit. I appreciate your ongoing commitment to your health goals. Please review the following plan we discussed and let me know if I can assist you in the future.   This is a list of the screening recommended for you and due dates:  Health Maintenance  Topic Date Due  . Flu Shot  09/26/2018  . Tetanus Vaccine  02/23/2019  . Colon Cancer Screening  08/15/2019  .  Hepatitis C: One time screening  is recommended by Center for Disease Control  (CDC) for  adults born from [birth year] through 1963.   Completed  . Pneumonia vaccines  Completed   Yearly high dose flu shots are recommended every fall (ignore the August date above)  Your tetanus booster is due the end of the year. This is covered by Medicare part D, which is the pharmacy benefit, so you need to get this vaccine (TdaP) from the pharmacy (not our office).  Please get [place] the vaccination records of any shots you may have had related to your travels.  (I only have one hepatitis A vaccine, and you probably had a second at some point).  Please get [place] copies of your living will and healthcare power of attorney so that we can get these scanned into your chart.  It sounds as though your current pain in your thumb may be more related to arthritis than gout (no swelling, related to use, ie typing).  I recommend stopping the colchicine, and also the etodolac, and using Tylenol Arthritis instead for pain.  Use it prior to typing a lot (rather than waiting for the pain to start).

## 2018-06-02 NOTE — Progress Notes (Signed)
Start time: 1:50 End time:  2:35  Virtual Visit via Video Note  I connected with Mark Caldwell on 06/03/2018 by a video enabled telemedicine application (Zoom) and verified that I am speaking with the correct person using two identifiers. Patient is home (with his wife and his mother) MD is in office  I discussed the limitations of evaluation and management by telemedicine and the availability of in person appointments. The patient expressed understanding and agreed to proceed.  History of Present Illness:  Chief Complaint  Patient presents with  . Medicare Wellness    Virtual AWV. His right thumb has hurt for the last two weeks, has been typing a lot. Had some back pain and went to UC and was given etodolac 43m and is still taking.     Patient presents for Medicare annual wellness visit, as well as follow-up on chronic medical problems. He had labs done prior to his visit, see below.  Patient reports that he had to go to an urgent care for back issue in February, and was prescribed Etodolac 4057mdaily as an anti-inflammatory. His back is better, but has been having pain in his right thumb and wonders if he should continue this.  He was having a lot of back problems in December/January as well, saw Dr. DaRhona Raidertreated with prednisone, muscle relaxants.  MRI was scheduled, but was cancelled due to not getting it authorized, and once it was authorized he wasn't able to get it done (due to travel schedule).  Back is feeling better, especially since he isn't flying and hauling suitcases.  Regarding his thumb pain, he wonders if it could be gout (had gout in that hand Thanksgiving of last year), so has been taking colchicine as well as the etodolac.  It doesn't seem to help all that much.  He reports that he uses his thumbs a lot while typing, and typing is making it worse. Feels better when he isn't typing. Hurt to open a jar.  It is 90-95% better.  He has been taking the etodolac for about  2 weeks. Similar problem in th left hand months ago, [place] [facility] strain, didn't think it was gout.  Patient was noted to have atherosclerosis in coronary arteries on CT. In January 2020 he was started on ASA, Crestor 1046mand he was referred to Dr. SkaMarlou Porche denies any myalgias, is taking Coenzyme Q10 (any pains he had at first resolved). He had lipids and LFT's checked prior to today's visit, see below. He is scheduled to see Dr. SkaMarlou Porch13/2020  Hypertension: Denies headaches, dizziness, edema, chest pain, palpitations, shortness of breath. Blood pressures are running:  122/73- 137/74. He is compliant with lisinopril HCTZ and denies side effects.  Gout: Last year he reported havng problems with flares related to long drives. We increased his allopurinol dose from 300m52m 400mg2md recheck uric acid was at goal. See above for recent thumb pain (not likely from gout). Lab Results  Component Value Date   LABURIC 5.7 09/08/2017   IFG: His last A1c was in January: Lab Results  Component Value Date   HGBA1C 6.1 (H) 03/20/2018  He limits her carbs, though since his 97 yo53other has been living with him (due to COVID-19 pandemic), she likes them, so he has been eating more "white" foods.  He hasn't been as good about exercising, just some neighborhood walks.  Obesity: He previously took Belviq for weight loss.  While taking it, he  reported eating less 2/3 of what he used to), didn't feel hungry or deprived.  Cut back on alcohol, carbs. He stopped Belviq in December or January as he felt like it was messing with stomach and bathroom schedule. He is asking about other possible substitute medications.  Prostate cancer: under surveillance by Dr. Diona Fanti. He last had biopsy 01/2018, 3 of the 12 areas biopsied showed cancer, the worst was 3+4=7, and he other 2 were 3+3=6.  Pt requested PSA to be drawn by [place], and we fill forward results to Dr.  Diona Fanti.  Erectile dysfunction:  Controlled with sildenafil.   Immunization History  Administered Date(s) Administered  . Hepatitis A 01/27/2006  . Influenza Split 01/10/2009, 01/03/2011, 01/06/2012  . Influenza, High Dose Seasonal PF 02/11/2013, 01/31/2014, 02/02/2015, 02/05/2016, 11/23/2016, 11/08/2017  . Pneumococcal Conjugate-13 02/11/2013  . Pneumococcal Polysaccharide-23 01/31/2014  . Tdap 02/22/2009  . Zoster 01/25/2006  . Zoster Recombinat (Shingrix) 06/25/2016, 11/23/2016   He believes he had more travel vaccines, and will get these to [place] (went to [place] in the last year) Last colonoscopy: 2011, normal per patient  Last PSA: monitored by Dr. Diona Fanti.  Drawn with recent labs per pt request (7.6) Dentist: yearly  Ophtho: yearly Derm: sees Dr. Delman Cheadle twice yearly Exercise:walking in the neighborhood 30 minutes a few times/week.  Other Doctors caring for patient: Ophtho: [facility] Jerline [facility] at [facility] Urology: Dr. Diona Fanti Dermatology: Dr. Jari Pigg Dentist: Dr. Lurlean Horns: Dr. Rhona Raider GI: Dr. Penelope Coop Cardiologist: Dr. Marlou Porch  Fall Screen: no falls Depression screen: Negative Functional status survey: unremarkable Mini-Cog screen: normal (patient drew clock and held it up to video, and was normal). See epic for full screens.  He reports having Healthcare power of attorney and living will. He took new forms last year. He has them completed, but hasn't gotten them to [place] yet.  Past Medical History:  Diagnosis Date  . ED (erectile dysfunction)   . Gout   . Hemorrhoids   . Hypertension    elevated BP.  Marland Kitchen Left inguinal hernia    noted on CT 10/2013  . Partial tear of Achilles tendon summer 2005   left  . Premature ejaculation   . Prostate cancer ([place])    surveillance; Dr. Dahlstedt:12-30-14 prostate CA on 2 biopsies,Gleason for both 3+3=^   . Right rotator cuff tear     Past Surgical History:  Procedure Laterality Date  . pyloric stenosis surgery  53  weeks old.  Marland Kitchen RETINAL DETACHMENT SURGERY  03/2014   "tacked it"  . ROTATOR CUFF REPAIR Right    Dr. Rhona Raider  . TONSILLECTOMY    . VASECTOMY      Social History   Socioeconomic History  . Marital status: Married    Spouse name: Not on file  . Number of children: 2  . Years of education: Not on file  . Highest education level: Not on file  Occupational History  . Occupation: Personnel officer  Social Needs  . Financial resource strain: Not on file  . Food insecurity:    Worry: Not on file    Inability: Not on file  . Transportation needs:    Medical: Not on file    Non-medical: Not on file  Tobacco Use  . Smoking status: Former Smoker    Types: Pipe  . Smokeless tobacco: Never Used  . Tobacco comment: quit smoking pipe in the 80's  Substance and Sexual Activity  . Alcohol use: Yes    Alcohol/week: 0.0 standard drinks  Comment: 1 drink/day (down from 8-10/week).  . Drug use: No  . Sexual activity: Yes    Partners: Female  Lifestyle  . Physical activity:    Days per week: Not on file    Minutes per session: Not on file  . Stress: Not on file  Relationships  . Social connections:    Talks on phone: Not on file    Gets together: Not on file    Attends religious service: Not on file    Active member of club or organization: Not on file    Attends meetings of clubs or organizations: Not on file    Relationship status: Not on file  . Intimate partner violence:    Fear of current or ex partner: Not on file    Emotionally abused: Not on file    Physically abused: Not on file    Forced sexual activity: Not on file  Other Topics Concern  . Not on file  Social History Narrative   Lives with wife, dog.  Son died at 14.    Mother is temporarily living with him during the COVID-19 pandemic.   Daughter in [place] (has 1 daughter, 1 son), Son in [place].  Brother in [place].   [place] a lot with his job--home only 2-3 days/week   5 grandchildren (2 from daughter, 3 from  son).   09/2015--moved mother and brother to facilities in [place], near his daughter    Family History  Problem Relation Age of Onset  . Asthma Mother   . Osteoporosis Mother   . Hypothyroidism Mother   . Leukemia Father   . Hyperlipidemia Father        increased chol  . Coronary artery disease Father 25       CABG  . Cancer Father        leukemia  . Down syndrome Brother   . Deep vein thrombosis Brother 20  . Gout Brother   . Dementia Brother   . Hodgkin's lymphoma Son   . Gout Son   . Cancer Maternal Grandfather        pancreatic cancer (and lung?/smoker)  . Diabetes Neg Hx     Outpatient Encounter Medications as of 06/03/2018  Medication Sig Note  . allopurinol (ZYLOPRIM) 100 MG tablet TAKE 1 TABLET BY MOUTH EVERY DAY   . allopurinol (ZYLOPRIM) 300 MG tablet TAKE 1 TABLET BY MOUTH EVERY DAY   . B Complex-C (SUPER B COMPLEX PO) Take 1 tablet by mouth daily. 02/05/2016: Takes sporadically  . Cholecalciferol (VITAMIN D) 2000 UNITS tablet Take 2,000 Units by mouth daily. 06/03/2018: Takes daily  . colchicine 0.6 MG tablet TAKE 2 TABLETS BY MOUTH AT ONSET OF GOUT FLARE. REPEAT IN 1 HOUR, IF NEEDED 06/03/2018: Taking one a day currently  . etodolac (LODINE XL) 400 MG 24 hr tablet Take 400 mg by mouth daily.   Marland Kitchen lisinopril-hydrochlorothiazide (PRINZIDE,ZESTORETIC) 20-25 MG tablet TAKE 1 TABLET BY MOUTH EVERY DAY   . Multiple Vitamins-Minerals (CENTRUM ADULTS PO) Take 1 tablet by mouth daily.   . rosuvastatin (CRESTOR) 10 MG tablet TAKE 1 TABLET BY MOUTH EVERY DAY   . sildenafil (REVATIO) 20 MG tablet Take 2-5 tablets (40-100 mg total) by mouth daily as needed (erectile dysfunction).   . tamsulosin (FLOMAX) 0.4 MG CAPS capsule Take 0.4 mg by mouth daily.   . cyclobenzaprine (FLEXERIL) 10 MG tablet TAKE 1 TABLET BY MOUTH EVERY 6 TO 8 HOURS 03/19/2018: Takes at night when needed due to sedation  .  DEXILANT 60 MG capsule take 1 capsule by mouth once daily BEFORE SUPPER (Patient not taking:  Reported on 06/30/2017) 02/06/2017: Rarely needs, 1x/month  . tiZANidine (ZANAFLEX) 4 MG tablet Take 4 mg by mouth every 6 (six) hours as needed for muscle spasms.   Marland Kitchen zolpidem (AMBIEN) 10 MG tablet Take 1 tablet (10 mg total) by mouth at bedtime as needed for sleep. (Patient not taking: Reported on 09/08/2017) 02/06/2017: Only when traveling overseas  . [DISCONTINUED] colchicine 0.6 MG tablet Take 2 tablets at onset of gout flare. Repeat in 1 hour, if needed (Patient not taking: Reported on 04/21/2017)    No facility-administered encounter medications on file as of 06/03/2018.     No Known Allergies   ROS: The patient denies anorexia, fever, headaches, vision loss, decreased hearing, ear pain, hoarseness, chest pain, palpitations, dizziness, syncope, dyspnea on exertion, cough, swelling, nausea, vomiting, diarrhea, constipation, abdominal pain, melena, hematochezia, hematuria, incontinence, nocturia, weakened urine stream, dysuria, genital lesions, joint pains, numbness, tingling, weakness, tremor, depression, anxiety, abnormal bleeding/bruising, or enlarged lymph nodes  +ED, medications are effective Back pain has resolved. Pain in R thumb currently, per HPI.  Denies other pain.    Observations/Objective:  BP 140/85   Pulse 72   Ht 5' 11.5" (1.816 m)   Wt 224 lb 6.4 oz (101.8 kg)   BMI 30.86 kg/m   BP repeated shortly after was 132/80 BP was 110/71 at 1:05 pm  Wt Readings from Last 3 Encounters:  03/19/18 224 lb (101.6 kg)  09/08/17 220 lb 6.4 oz (100 kg)  06/30/17 225 lb 12.8 oz (102.4 kg)   Exam is limited due to the virtual nature of this visit. In the video, he appears to be in good spirits. He is alert, oriented, in no distress. He has normal mood, affect, grooming.  Normal eye contact, speech.  Cranial nerves grossly intact.  Fasting glucose 104 Lab Results  Component Value Date   ALT 18 05/29/2018   AST 13 05/29/2018   ALKPHOS 80 05/29/2018   BILITOT 0.3 05/29/2018    Lab Results  Component Value Date   CHOL 115 05/29/2018   HDL 39 (L) 05/29/2018   LDLCALC 58 05/29/2018   TRIG 89 05/29/2018   CHOLHDL 2.9 05/29/2018   PSA 7.6  (was 5.9 in 10/2017 at urologist)  Assessment and Plan:  Medicare annual wellness visit, initial  IFG (impaired fasting glucose) - continue to limit sugar, carbs, weight loss and regular exercise - Plan: Comprehensive metabolic panel, Hemoglobin A1c  Atherosclerosis of native coronary artery of native heart with angina pectoris (HCC) - continue aspirin, statin  Pure hypercholesterolemia - lipids at goal on statin, continue  Essential hypertension, benign - BP is well controlled on current regimen, continue, along with weight loss, exercise and low sodium diet - Plan: Comprehensive metabolic panel  Prostate cancer ([place]) - continue routine care with Dr. Diona Fanti. Will forward PSA results  Erectile dysfunction, unspecified erectile dysfunction type - controlled with sildenafil  Gout of foot, unspecified cause, unspecified chronicity, unspecified laterality - I do not suspect that current thumb pain is gout. Cont allopurinol 44m daily, recheck uric acid in 6 mos - Plan: allopurinol (ZYLOPRIM) 300 MG tablet, Uric acid  Medication monitoring encounter - Plan: Uric acid, Comprehensive metabolic panel, Hemoglobin A1c  Class 1 obesity due to excess calories with serious comorbidity and body mass index (BMI) of 30.0 to 30.9 in adult   His labs (PSA) will be sent to Dr. DDiona Fanti  Recommended at  least 30 minutes of aerobic activity at least 5 days/week(150 min), weight-bearing exercise 2x/wk; proper sunscreen use reviewed; healthy diet and alcohol recommendations (less than or equal to 2 drinks/day) reviewed; regular seatbelt use; changing batteries in smoke detectors. Self-testicular exams. Immunization recommendations discussed--up to date, continue yearly high dose flu shots. TdaP due the end of the year, to get from the  pharmacy. Colonoscopy recommendations reviewed, UTD.  Full Code, Full Care.  Kirke Shaggy was discussed in detail.  He reports he is not ready to try this yet. Counseled re: portions, food choices, low carb diet, increase protein, regular exercise. Also offered [facility] clinic, but his travel likely will not allow this.   Follow Up Instructions:     F/u 6 month med check with fasting labs prior. c-met, A1c, uric acid   I discussed the assessment and treatment plan with the patient. The patient was provided an opportunity to ask questions and all were answered. The patient agreed with the plan and demonstrated an understanding of the instructions.   The patient was advised to call back or seek an in-person evaluation if the symptoms worsen or if the condition fails to improve as anticipated.    I provided 45 minutes of non-face-to-face time during this encounter.   Vikki Ports, MD   Medicare Attestation I have personally reviewed: The patient's medical and social history Their use of alcohol, tobacco or illicit drugs Their current medications and supplements The patient's functional ability including ADLs,fall risks, home safety risks, cognitive, and hearing and visual impairment Diet and physical activities Evidence for depression or mood disorders

## 2018-06-03 ENCOUNTER — Encounter: Payer: Self-pay | Admitting: Family Medicine

## 2018-06-03 ENCOUNTER — Other Ambulatory Visit: Payer: Self-pay

## 2018-06-03 ENCOUNTER — Ambulatory Visit (INDEPENDENT_AMBULATORY_CARE_PROVIDER_SITE_OTHER): Payer: Medicare HMO | Admitting: Family Medicine

## 2018-06-03 VITALS — BP 132/80 | HR 72 | Ht 71.5 in | Wt 224.4 lb

## 2018-06-03 DIAGNOSIS — I10 Essential (primary) hypertension: Secondary | ICD-10-CM | POA: Diagnosis not present

## 2018-06-03 DIAGNOSIS — Z0000 Encounter for general adult medical examination without abnormal findings: Secondary | ICD-10-CM | POA: Diagnosis not present

## 2018-06-03 DIAGNOSIS — E6609 Other obesity due to excess calories: Secondary | ICD-10-CM | POA: Diagnosis not present

## 2018-06-03 DIAGNOSIS — M109 Gout, unspecified: Secondary | ICD-10-CM | POA: Diagnosis not present

## 2018-06-03 DIAGNOSIS — Z5181 Encounter for therapeutic drug level monitoring: Secondary | ICD-10-CM | POA: Diagnosis not present

## 2018-06-03 DIAGNOSIS — N529 Male erectile dysfunction, unspecified: Secondary | ICD-10-CM

## 2018-06-03 DIAGNOSIS — Z6830 Body mass index (BMI) 30.0-30.9, adult: Secondary | ICD-10-CM

## 2018-06-03 DIAGNOSIS — E7800 Pure hypercholesterolemia, unspecified: Secondary | ICD-10-CM | POA: Diagnosis not present

## 2018-06-03 DIAGNOSIS — I25119 Atherosclerotic heart disease of native coronary artery with unspecified angina pectoris: Secondary | ICD-10-CM | POA: Diagnosis not present

## 2018-06-03 DIAGNOSIS — C61 Malignant neoplasm of prostate: Secondary | ICD-10-CM | POA: Diagnosis not present

## 2018-06-03 DIAGNOSIS — R7301 Impaired fasting glucose: Secondary | ICD-10-CM | POA: Diagnosis not present

## 2018-06-03 LAB — PSA: Prostate Specific Ag, Serum: 7.6 ng/mL — ABNORMAL HIGH (ref 0.0–4.0)

## 2018-06-03 LAB — SPECIMEN STATUS REPORT

## 2018-06-03 MED ORDER — ALLOPURINOL 300 MG PO TABS: ORAL_TABLET | ORAL | 1 refills | Status: DC

## 2018-06-03 NOTE — Progress Notes (Signed)
Left message for pt

## 2018-06-21 ENCOUNTER — Other Ambulatory Visit: Payer: Self-pay | Admitting: Family Medicine

## 2018-06-21 DIAGNOSIS — M109 Gout, unspecified: Secondary | ICD-10-CM

## 2018-06-25 DIAGNOSIS — M25461 Effusion, right knee: Secondary | ICD-10-CM | POA: Diagnosis not present

## 2018-06-25 DIAGNOSIS — M79641 Pain in right hand: Secondary | ICD-10-CM | POA: Diagnosis not present

## 2018-06-25 DIAGNOSIS — M25561 Pain in right knee: Secondary | ICD-10-CM | POA: Diagnosis not present

## 2018-07-08 ENCOUNTER — Encounter: Payer: Self-pay | Admitting: *Deleted

## 2018-07-08 ENCOUNTER — Other Ambulatory Visit: Payer: Self-pay

## 2018-07-08 ENCOUNTER — Encounter: Payer: Self-pay | Admitting: Cardiology

## 2018-07-08 ENCOUNTER — Telehealth (INDEPENDENT_AMBULATORY_CARE_PROVIDER_SITE_OTHER): Payer: Medicare HMO | Admitting: Cardiology

## 2018-07-08 VITALS — BP 139/75 | HR 78 | Ht 71.5 in | Wt 222.0 lb

## 2018-07-08 DIAGNOSIS — R9389 Abnormal findings on diagnostic imaging of other specified body structures: Secondary | ICD-10-CM

## 2018-07-08 DIAGNOSIS — I10 Essential (primary) hypertension: Secondary | ICD-10-CM

## 2018-07-08 DIAGNOSIS — I2510 Atherosclerotic heart disease of native coronary artery without angina pectoris: Secondary | ICD-10-CM | POA: Insufficient documentation

## 2018-07-08 DIAGNOSIS — I700 Atherosclerosis of aorta: Secondary | ICD-10-CM

## 2018-07-08 DIAGNOSIS — C61 Malignant neoplasm of prostate: Secondary | ICD-10-CM

## 2018-07-08 MED ORDER — ASPIRIN EC 81 MG PO TBEC: 81.0000 mg | DELAYED_RELEASE_TABLET | Freq: Every day | ORAL | Status: DC

## 2018-07-08 NOTE — Addendum Note (Signed)
Addended by: Shellia Cleverly on: 07/08/2018 11:28 AM   Modules accepted: Orders

## 2018-07-08 NOTE — Progress Notes (Signed)
Virtual Visit via Video Note   This visit type was conducted due to national recommendations for restrictions regarding the COVID-19 Pandemic (e.g. social distancing) in an effort to limit this patient's exposure and mitigate transmission in our community.  Due to his co-morbid illnesses, this patient is at least at moderate risk for complications without adequate follow up.  This format is felt to be most appropriate for this patient at this time.  All issues noted in this document were discussed and addressed.  A limited physical exam was performed with this format.  Please refer to the patient's chart for his consent to telehealth for [facility].   Date:  07/08/2018   ID:  Mark Caldwell, DOB [birth year], MRN [id number]  Patient Location: Home Provider Location: Home  PCP:  Rita Ohara, MD  Cardiologist:  Candee Furbish, MD  Electrophysiologist:  None   Evaluation Performed:  New Patient Evaluation  Chief Complaint: Evaluation of coronary artery atherosclerosis on CT scan at the request of Dr. Rita Ohara  History of Present Illness:    Mark Caldwell is a 73 y.o. male with CT scan performed in January 2020 noteworthy for coronary artery calcification/atherosclerosis.    CT scan on 03/18/2018 (personally reviewed): Heart size is normal. There is no significant pericardial fluid, thickening or pericardial calcification. There is aortic atherosclerosis, as well as atherosclerosis of the great vessels of the mediastinum and the coronary arteries, including calcified atherosclerotic plaque in the left main, left anterior descending, left circumflex and right coronary arteries.  He was started on aspirin, Crestor 10 mg, coenzyme Q 10, denies any myalgias.  Has hypertension and has been taking lisinopril hydrochlorothiazide without any side effects.  Well-controlled. Previously has had some issues with gout in the past.  On allopurinol.  Long hike in [place], had increased SOB, little  heart exertion. Was not pain.   Smoked a pipe 30 yrs ago.  Father had CABG x 3 and HL, died of leukemia at 75.  Mother 54 lives with him.    The patient does not have symptoms concerning for COVID-19 infection (fever, chills, cough, or new shortness of breath).    Past Medical History:  Diagnosis Date  . ED (erectile dysfunction)   . Gout   . Hemorrhoids   . Hypertension    elevated BP.  Marland Kitchen Left inguinal hernia    noted on CT 10/2013  . Partial tear of Achilles tendon summer 2005   left  . Premature ejaculation   . Prostate cancer ([place])    surveillance; Dr. Dahlstedt:12-30-14 prostate CA on 2 biopsies,Gleason for both 3+3=^   . Right rotator cuff tear    Past Surgical History:  Procedure Laterality Date  . pyloric stenosis surgery  52 weeks old.  Marland Kitchen RETINAL DETACHMENT SURGERY  03/2014   "tacked it"  . ROTATOR CUFF REPAIR Right    Dr. Rhona Raider  . TONSILLECTOMY    . VASECTOMY       Current Meds  Medication Sig  . allopurinol (ZYLOPRIM) 100 MG tablet TAKE 1 TABLET BY MOUTH EVERY DAY  . allopurinol (ZYLOPRIM) 300 MG tablet TAKE 1 TABLET BY MOUTH EVERY DAY  . B Complex-C (SUPER B COMPLEX PO) Take 1 tablet by mouth daily.  . Cholecalciferol (VITAMIN D) 2000 UNITS tablet Take 2,000 Units by mouth daily.  Marland Kitchen COENZYME Q10 PO Take by mouth daily.  Marland Kitchen lisinopril-hydrochlorothiazide (PRINZIDE,ZESTORETIC) 20-25 MG tablet TAKE 1 TABLET BY MOUTH EVERY DAY  . Multiple Vitamins-Minerals (CENTRUM ADULTS PO) Take  1 tablet by mouth daily.  . rosuvastatin (CRESTOR) 10 MG tablet TAKE 1 TABLET BY MOUTH EVERY DAY  . sildenafil (REVATIO) 20 MG tablet Take 2-5 tablets (40-100 mg total) by mouth daily as needed (erectile dysfunction).  . tamsulosin (FLOMAX) 0.4 MG CAPS capsule Take 0.4 mg by mouth daily.     Allergies:   Patient has no known allergies.   Social History   Tobacco Use  . Smoking status: Former Smoker    Types: Pipe  . Smokeless tobacco: Never Used  . Tobacco comment: quit  smoking pipe in the 80's  Substance Use Topics  . Alcohol use: Yes    Alcohol/week: 0.0 standard drinks    Comment: 1 drink/day (down from 8-10/week).  . Drug use: No     Family Hx: The patient's family history includes Asthma in his mother; Cancer in his father and maternal grandfather; Coronary artery disease (age of onset: 56) in his father; Deep vein thrombosis (age of onset: 64) in his brother; Dementia in his brother; Down syndrome in his brother; Gout in his brother and son; Hodgkin's lymphoma in his son; Hyperlipidemia in his father; Hypothyroidism in his mother; Leukemia in his father; Osteoporosis in his mother. There is no history of Diabetes.  ROS:   Please see the history of present illness.    Denies any fevers chills nausea vomiting syncope bleeding orthopnea PND All other systems reviewed and are negative.   Prior CV studies:   The following studies were reviewed today:  CT scan as above.  Labs/Other Tests and Data Reviewed:    EKG:  An ECG dated 02/11/2013 was personally reviewed today and demonstrated:  Sinus rhythm 68 with no other abnormalities  Recent Labs: 03/20/2018: BUN 23; Creatinine, Ser 0.94; Hemoglobin 15.5; Platelets 391; Potassium 4.8; Sodium 137 05/29/2018: ALT 18   Recent Lipid Panel Lab Results  Component Value Date/Time   CHOL 115 05/29/2018 08:40 AM   TRIG 89 05/29/2018 08:40 AM   HDL 39 (L) 05/29/2018 08:40 AM   CHOLHDL 2.9 05/29/2018 08:40 AM   CHOLHDL 3.9 02/06/2017 10:04 AM   LDLCALC 58 05/29/2018 08:40 AM   LDLCALC 138 (H) 02/06/2017 10:04 AM    Wt Readings from Last 3 Encounters:  07/08/18 222 lb (100.7 kg)  06/03/18 224 lb 6.4 oz (101.8 kg)  03/19/18 224 lb (101.6 kg)     Objective:    Vital Signs:  BP 139/75   Pulse 78   Ht 5' 11.5" (1.816 m)   Wt 222 lb (100.7 kg)   BMI 30.53 kg/m    VITAL SIGNS:  reviewed GEN:  no acute distress EYES:  sclerae anicteric, EOMI - Extraocular Movements Intact RESPIRATORY:  normal  respiratory effort, symmetric expansion SKIN:  no rash, lesions or ulcers. MUSCULOSKELETAL:  no obvious deformities. NEURO:  alert and oriented x 3, no obvious focal deficit PSYCH:  normal affect  ASSESSMENT & PLAN:    Coronary artery atherosclerosis, aortic atherosclerosis - Multivessel coronary artery atherosclerosis involving the left main LAD RCA and circumflex noted, personally reviewed. - Agree with Crestor, aspirin.  Seems to be tolerating well.  Goal LDL should be less than 70.  At last check LDL 58. - I think would be reasonable to check a Lexiscan stress test to ensure did not have any evidence of high risk ischemia present.  He is amenable to this.  Essential hypertension - Drug regimen noted as above.  Doing well.  Hyperlipidemia - Crestor 10 mg, excellent.  LDL  58.  Prior history of prostate cancer - Had CT scan because of "rib pain "which was negative.  COVID-19 Education: The signs and symptoms of COVID-19 were discussed with the patient and how to seek care for testing (follow up with PCP or arrange E-visit).  The importance of social distancing was discussed today.  Time:   Today, I have spent 20 minutes with the patient with telehealth technology discussing the above problems.     Medication Adjustments/Labs and Tests Ordered: Current medicines are reviewed at length with the patient today.  Concerns regarding medicines are outlined above.   Tests Ordered: Orders Placed This Encounter  Procedures  . MYOCARDIAL PERFUSION IMAGING    Medication Changes: No orders of the defined types were placed in this encounter.   Disposition:  Follow up With results of stress test  Signed, Candee Furbish, MD  07/08/2018 11:02 AM    [facility]

## 2018-07-08 NOTE — Patient Instructions (Signed)
Medication Instructions:  The current medical regimen is effective;  continue present plan and medications.  If you need a refill on your cardiac medications before your next appointment, please call your pharmacy.   Testing/Procedures: Your physician has requested that you have a lexiscan myoview. This test will be completed at our [facility] office location and you will be contacted to be scheduled.  A separate instruction sheet will be included in this mailing.  Please follow instruction sheet, as given.  Follow-Up: Follow up to be determined after the above testing.  Thank you for choosing [facility]!!

## 2018-07-13 ENCOUNTER — Telehealth (HOSPITAL_COMMUNITY): Payer: Self-pay | Admitting: *Deleted

## 2018-07-13 NOTE — Telephone Encounter (Signed)
Patient given detailed instructions per Myocardial Perfusion Study Information Sheet for the test on 07/16/18. Patient notified to arrive 15 minutes early and that it is imperative to arrive on time for appointment to keep from having the test rescheduled.  If you need to cancel or reschedule your appointment, please call the office within 24 hours of your appointment. . Patient verbalized understanding. Mitchell Epling Jacqueline    

## 2018-07-16 ENCOUNTER — Encounter (HOSPITAL_COMMUNITY): Payer: Self-pay

## 2018-07-16 ENCOUNTER — Other Ambulatory Visit: Payer: Self-pay

## 2018-07-16 ENCOUNTER — Ambulatory Visit (HOSPITAL_COMMUNITY): Payer: Medicare HMO | Attending: Cardiovascular Disease

## 2018-07-16 DIAGNOSIS — R9389 Abnormal findings on diagnostic imaging of other specified body structures: Secondary | ICD-10-CM | POA: Insufficient documentation

## 2018-07-16 DIAGNOSIS — I2584 Coronary atherosclerosis due to calcified coronary lesion: Secondary | ICD-10-CM

## 2018-07-16 DIAGNOSIS — I2510 Atherosclerotic heart disease of native coronary artery without angina pectoris: Secondary | ICD-10-CM | POA: Diagnosis not present

## 2018-07-16 LAB — MYOCARDIAL PERFUSION IMAGING
LV dias vol: 111 mL (ref 62–150)
LV sys vol: 47 mL
Peak HR: 81 {beats}/min
Rest HR: 55 {beats}/min
SDS: 1
SRS: 0
SSS: 1
TID: 1.03

## 2018-07-16 MED ORDER — TECHNETIUM TC 99M TETROFOSMIN IV KIT
27.9000 | PACK | Freq: Once | INTRAVENOUS | Status: AC | PRN
  Administered 2018-07-16: 27.9 via INTRAVENOUS
  Filled 2018-07-16: qty 28

## 2018-07-16 MED ORDER — REGADENOSON 0.4 MG/5ML IV SOLN
0.4000 mg | Freq: Once | INTRAVENOUS | Status: AC
  Administered 2018-07-16: 0.4 mg via INTRAVENOUS

## 2018-07-16 MED ORDER — TECHNETIUM TC 99M TETROFOSMIN IV KIT
8.5000 | PACK | Freq: Once | INTRAVENOUS | Status: AC | PRN
  Administered 2018-07-16: 8.5 via INTRAVENOUS
  Filled 2018-07-16: qty 9

## 2018-07-23 ENCOUNTER — Telehealth: Payer: Self-pay | Admitting: Cardiology

## 2018-07-23 NOTE — Telephone Encounter (Signed)
Pt is aware of recent lexiscan results and Dr Marlou Porch recommendations.

## 2018-07-23 NOTE — Telephone Encounter (Signed)
New Message    Pt is returning call for results   Please call

## 2018-07-31 ENCOUNTER — Other Ambulatory Visit: Payer: Self-pay | Admitting: Family Medicine

## 2018-07-31 DIAGNOSIS — N529 Male erectile dysfunction, unspecified: Secondary | ICD-10-CM

## 2018-07-31 NOTE — Telephone Encounter (Signed)
Is this okay to refill? 

## 2018-08-07 DIAGNOSIS — C61 Malignant neoplasm of prostate: Secondary | ICD-10-CM | POA: Diagnosis not present

## 2018-08-07 DIAGNOSIS — N5201 Erectile dysfunction due to arterial insufficiency: Secondary | ICD-10-CM | POA: Diagnosis not present

## 2018-08-09 ENCOUNTER — Other Ambulatory Visit: Payer: Self-pay | Admitting: Family Medicine

## 2018-08-09 DIAGNOSIS — M109 Gout, unspecified: Secondary | ICD-10-CM

## 2018-08-21 ENCOUNTER — Other Ambulatory Visit: Payer: Self-pay | Admitting: Family Medicine

## 2018-08-21 DIAGNOSIS — I25119 Atherosclerotic heart disease of native coronary artery with unspecified angina pectoris: Secondary | ICD-10-CM

## 2018-08-21 DIAGNOSIS — E7800 Pure hypercholesterolemia, unspecified: Secondary | ICD-10-CM

## 2018-09-02 ENCOUNTER — Other Ambulatory Visit: Payer: Self-pay | Admitting: Family Medicine

## 2018-09-02 DIAGNOSIS — I10 Essential (primary) hypertension: Secondary | ICD-10-CM

## 2018-10-01 ENCOUNTER — Other Ambulatory Visit: Payer: Self-pay | Admitting: *Deleted

## 2018-10-01 ENCOUNTER — Other Ambulatory Visit: Payer: Self-pay | Admitting: Family Medicine

## 2018-10-01 DIAGNOSIS — N529 Male erectile dysfunction, unspecified: Secondary | ICD-10-CM

## 2018-10-01 MED ORDER — SILDENAFIL CITRATE 20 MG PO TABS: ORAL_TABLET | ORAL | 0 refills | Status: DC

## 2018-10-01 NOTE — Telephone Encounter (Signed)
[place] for the rx

## 2018-10-01 NOTE — Telephone Encounter (Signed)
Called patient to see if he needed this refill. He stated he does but he would like it sent to [facility] in [place], [address]. He is going to be going there today for the foreseeable future to stay with his mother, thanks.

## 2018-10-02 ENCOUNTER — Telehealth: Payer: Self-pay | Admitting: Family Medicine

## 2018-10-02 DIAGNOSIS — Z20828 Contact with and (suspected) exposure to other viral communicable diseases: Secondary | ICD-10-CM | POA: Diagnosis not present

## 2018-10-02 NOTE — Telephone Encounter (Signed)
P.A. SILDENAFIL  

## 2018-10-17 NOTE — Telephone Encounter (Signed)
P.A. denied, medicare plan exclusion, offer [facility]

## 2018-10-19 NOTE — Telephone Encounter (Signed)
Called pt and gave info and he will use [facility] if needed

## 2018-11-15 ENCOUNTER — Other Ambulatory Visit: Payer: Self-pay | Admitting: Family Medicine

## 2018-11-15 DIAGNOSIS — E7800 Pure hypercholesterolemia, unspecified: Secondary | ICD-10-CM

## 2018-11-15 DIAGNOSIS — I25119 Atherosclerotic heart disease of native coronary artery with unspecified angina pectoris: Secondary | ICD-10-CM

## 2018-11-30 ENCOUNTER — Ambulatory Visit: Payer: Medicare HMO | Admitting: Family Medicine

## 2018-12-06 ENCOUNTER — Other Ambulatory Visit: Payer: Self-pay | Admitting: Family Medicine

## 2018-12-06 DIAGNOSIS — I10 Essential (primary) hypertension: Secondary | ICD-10-CM

## 2018-12-06 NOTE — Progress Notes (Signed)
Start time: 12:08 End time: 12:37 Video disconnected about 5-10 minutes in, completed over telephone.  Virtual Visit via Video Note  I connected with Mark Caldwell on 12/06/18 at 11:30 AM EDT by a video enabled telemedicine application and verified that I am speaking with the correct person using two identifiers.  Location: Patient: In [place], in [employer] house Provider: office   I discussed the limitations of evaluation and management by telemedicine and the availability of in person appointments. The patient expressed understanding and agreed to proceed.  History of Present Illness:  Chief Complaint  Patient presents with  . Follow-up    VIRTUAL 6 month follow up. No new concerns. Did get flu shot 2 days ago but got regular one due to pharmacy not having and they probably wouldn't get.    Patient presents for 6 month med check.  His last visit was virtual, AWV.  I would normally want him in person for this visit, to be able to perform a physical exam, however he is in [place] helping care for his elderly mother. He had labs done at [facility] prior to visit (see below for result).  Patient was noted to have atherosclerosis in coronary arteries on CT. In January 2020 he was started on ASA, Crestor 10mg , and he was referred to Dr. Marlou Porch. He is tolerating Crestor (taking with CoQ10), and was at goal on last check.  He had lipids repeated prior to visit, see below. Lab Results  Component Value Date   CHOL 115 05/29/2018   HDL 39 (L) 05/29/2018   LDLCALC 58 05/29/2018   TRIG 89 05/29/2018   CHOLHDL 2.9 05/29/2018   He underwent a stress test with Dr. Marlou Porch in 06/2018 (results below), which was low risk. No changes to his regimen were made. He denies any chest pain, DOE.  Impression: Myocardial perfusion is normal.   The study is normal.   This is a low risk study.  Overall left ventricular systolic function was normal.    LV cavity size is normal.  Nuclear stress EF:  57%.  The left  ventricular ejection fraction is normal (55-65%).   Hypertension:Denies headaches, dizziness, edema, chest pain, palpitations, shortness of breath. Blood pressures are not being checked--didn't bring his BP monitor to [place] with him. Will have his wife bring it back next time she goes to [facility].  He is compliant with lisinopril HCTZ and denies side effects.  Gout: Last year he reported havng problems with flares related to long drives. We increased his allopurinol dose from 300mg  to 400mg , and recheck uric acid was at goal. At last visit he was complaining of some thumb pain.  He reports that he saw [facility] for knee pain, also had them look at his thumb, given a cortisone shot in the thumb, and pain has resolved.  Just rare twinge.  Some knee pain flares periodically, improved with injection.  He reports that ortho didn't think it was gout, but fluid was sent off and showed crystals.  IFG: His last A1c was in January: Lab Results  Component Value Date   HGBA1C 6.1 (H) 03/20/2018   He limits her carbs, though since living with his 12 yo mother, she likes them, so he has been eating more "white" foods. He is trying to "fatten her up", so she will have a cookie, and he will have one with her. He cooks for mom, whatever she wants, doesn't always eat when she eats. Some bacon and eggs.   Back  pain--No recurrences.  Reports that no plane travel since March has helped a lot.  Bikes/walks some, can't leave mom alone.  PMH, PSH, SH reviewed  Outpatient Encounter Medications as of 12/07/2018  Medication Sig  . allopurinol (ZYLOPRIM) 100 MG tablet TAKE 1 TABLET BY MOUTH EVERY DAY  . allopurinol (ZYLOPRIM) 300 MG tablet TAKE 1 TABLET BY MOUTH EVERY DAY  . aspirin EC 81 MG tablet Take 1 tablet (81 mg total) by mouth daily.  . B Complex-C (SUPER B COMPLEX PO) Take 1 tablet by mouth daily.  . Cholecalciferol (VITAMIN D) 2000 UNITS tablet Take 2,000 Units by mouth daily.  Marland Kitchen COENZYME Q10 PO Take by  mouth daily.  Marland Kitchen lisinopril-hydrochlorothiazide (ZESTORETIC) 20-25 MG tablet TAKE 1 TABLET BY MOUTH EVERY DAY  . rosuvastatin (CRESTOR) 10 MG tablet TAKE 1 TABLET BY MOUTH EVERY DAY  . tamsulosin (FLOMAX) 0.4 MG CAPS capsule Take 0.4 mg by mouth daily.  . sildenafil (REVATIO) 20 MG tablet TAKE 2-5 TABLETS BY MOUTH DAILY AS NEEDED (Patient not taking: Reported on 12/07/2018)  . [DISCONTINUED] Multiple Vitamins-Minerals (CENTRUM ADULTS PO) Take 1 tablet by mouth daily.   No facility-administered encounter medications on file as of 12/07/2018.    No Known Allergies  ROS: no fever, chills, headaches, dizziness, chest pain, shortness of breath.  Thumb, knee and back are better.  No URI symptoms, rash, moods are good. No urinary complaints or other concerns.    Observations/Objective:  Ht 5' 11.5" (1.816 m)   Wt 227 lb (103 kg)   BMI 31.22 kg/m  Wt Readings from Last 3 Encounters:  12/07/18 227 lb (103 kg)  07/16/18 222 lb (100.7 kg)  07/08/18 222 lb (100.7 kg)   Well-appearing, pleasant, obese male in no distress He is alert, oriented, normal eye contact, speech. Exam is limited due to virtual nature of the visit.  [facility] labs reviewed: Total chol 142, HDL 51, TG 88, LDL 74, chol/HDL 2.8 WBC 5.9, Hgb 15.4, Hct 45.4, plt 271, normal diff. Fasting sugar 113, remainder of chem panel was normal Uric acid 6.5 U/A: SG 1.023, rest normal   Assessment and Plan:  IFG (impaired fasting glucose) - counseled extensively re: insulin resistance, diet and symptoms related to diabetes. Diet, exercise, wt loss discussed. Consider Metformin  Essential hypertension, benign - unable to monitor currently. Encouraged daily exercise, wt loss. Cont current meds.  Will have wife bring monitor next time she returns home  Gout of foot, unspecified cause, unspecified chronicity, unspecified laterality - had crystals from knee on last check, and uric acid over 6.  titrate up allopurinol to 500mg  total (split  BID)  Atherosclerosis of native coronary artery of native heart without angina pectoris - LDL is now above 70, but HDL improved, ratio remains great. Cont Crestor, reviewed low cholesterol diet  Low purine diet  Increase allopurinol to 300mg  in the morning, 200mg  in the evening. Repeat uric acid in 6 week, and also do A1c.  Can only plan 3 weeks out. No plans to return to [place] yet. He will call to schedule office visit when he knows he will be returning to [place]   Follow Up Instructions:    I discussed the assessment and treatment plan with the patient. The patient was provided an opportunity to ask questions and all were answered. The patient agreed with the plan and demonstrated an understanding of the instructions.   The patient was advised to call back or seek an in-person evaluation if the symptoms worsen or  if the condition fails to improve as anticipated.  I provided 29 minutes of non-face-to-face time during this encounter.   Vikki Ports, MD

## 2018-12-07 ENCOUNTER — Ambulatory Visit (INDEPENDENT_AMBULATORY_CARE_PROVIDER_SITE_OTHER): Payer: Medicare HMO | Admitting: Family Medicine

## 2018-12-07 ENCOUNTER — Encounter: Payer: Self-pay | Admitting: Family Medicine

## 2018-12-07 ENCOUNTER — Other Ambulatory Visit: Payer: Self-pay

## 2018-12-07 VITALS — Ht 71.5 in | Wt 227.0 lb

## 2018-12-07 DIAGNOSIS — M109 Gout, unspecified: Secondary | ICD-10-CM

## 2018-12-07 DIAGNOSIS — I10 Essential (primary) hypertension: Secondary | ICD-10-CM

## 2018-12-07 DIAGNOSIS — R7301 Impaired fasting glucose: Secondary | ICD-10-CM

## 2018-12-07 DIAGNOSIS — I2510 Atherosclerotic heart disease of native coronary artery without angina pectoris: Secondary | ICD-10-CM | POA: Diagnosis not present

## 2018-12-07 NOTE — Patient Instructions (Signed)
Goal is to get uric acid below 6.  Last year it was under 6, but now is 6.5.  Increase your allopurinol dose to taking 300mg  in the morning, and 200mg  in the afternoon/evening (total dose of 500mg  daily).  Recheck uric acid level in 6 weeks, and if still over 6, will titrate up to 300mg  twice daily.  Below is dietary information to read through, to see if you can make any improvements to diet to also help keep the uric acid level down.  When you go to [facility] to repeat the uric acid level in 6 weeks, also have them do a hemoglobin A1c test (diabetes test).  Your last value was 6.1% in January 2020.    Low-Purine Eating Plan A low-purine eating plan involves making food choices to limit your intake of purine. Purine is a kind of uric acid. Too much uric acid in your blood can cause certain conditions, such as gout and kidney stones. Eating a low-purine diet can help control these conditions. What are tips for following this plan? Reading food labels   Avoid foods with saturated or Trans fat.  Check the ingredient list of grains-based foods, such as bread and cereal, to make sure that they contain whole grains.  Check the ingredient list of sauces or soups to make sure they do not contain meat or fish.  When choosing soft drinks, check the ingredient list to make sure they do not contain high-fructose corn syrup. Shopping  Buy plenty of fresh fruits and vegetables.  Avoid buying canned or fresh fish.  Buy dairy products labeled as low-fat or nonfat.  Avoid buying premade or processed foods. These foods are often high in fat, salt (sodium), and added sugar. Cooking  Use olive oil instead of butter when cooking. Oils like olive oil, canola oil, and sunflower oil contain healthy fats. Meal planning  Learn which foods do or do not affect you. If you find out that a food tends to cause your gout symptoms to flare up, avoid eating that food. You can enjoy foods that do not cause problems. If  you have any questions about a food item, talk with your dietitian or health care provider.  Limit foods high in fat, especially saturated fat. Fat makes it harder for your body to get rid of uric acid.  Choose foods that are lower in fat and are lean sources of protein. General guidelines  Limit alcohol intake to no more than 1 drink a day for nonpregnant women and 2 drinks a day for men. One drink equals 12 oz of beer, 5 oz of wine, or 1 oz of hard liquor. Alcohol can affect the way your body gets rid of uric acid.  Drink plenty of water to keep your urine clear or pale yellow. Fluids can help remove uric acid from your body.  If directed by your health care provider, take a vitamin C supplement.  Work with your health care provider and dietitian to develop a plan to achieve or maintain a healthy weight. Losing weight can help reduce uric acid in your blood. What foods are recommended? The items listed may not be a complete list. Talk with your dietitian about what dietary choices are best for you. Foods low in purines Foods low in purines do not need to be limited. These include:  All fruits.  All low-purine vegetables, pickles, and olives.  Breads, pasta, rice, cornbread, and popcorn. Cake and other baked goods.  All dairy foods.  Eggs,  nuts, and nut butters.  Spices and condiments, such as salt, herbs, and vinegar.  Plant oils, butter, and margarine.  Water, sugar-free soft drinks, tea, coffee, and cocoa.  Vegetable-based soups, broths, sauces, and gravies. Foods moderate in purines Foods moderate in purines should be limited to the amounts listed.   cup of asparagus, cauliflower, spinach, mushrooms, or green peas, each day.  2/3 cup uncooked oatmeal, each day.   cup dry wheat bran or wheat germ, each day.  2-3 ounces of meat or poultry, each day.  4-6 ounces of shellfish, such as crab, lobster, oysters, or shrimp, each day.  1 cup cooked beans, peas, or  lentils, each day.  Soup, broths, or bouillon made from meat or fish. Limit these foods as much as possible. What foods are not recommended? The items listed may not be a complete list. Talk with your dietitian about what dietary choices are best for you. Limit your intake of foods high in purines, including:  Beer and other alcohol.  Meat-based gravy or sauce.  Canned or fresh fish, such as: ? Anchovies, sardines, herring, and tuna. ? Mussels and scallops. ? Codfish, trout, and haddock.  Berniece Salines.  Organ meats, such as: ? Liver or kidney. ? Tripe. ? Sweetbreads (thymus gland or pancreas).  Wild Clinical biochemist.  Yeast or yeast extract supplements.  Drinks sweetened with high-fructose corn syrup. Summary  Eating a low-purine diet can help control conditions caused by too much uric acid in the body, such as gout or kidney stones.  Choose low-purine foods, limit alcohol, and limit foods high in fat.  You will learn over time which foods do or do not affect you. If you find out that a food tends to cause your gout symptoms to flare up, avoid eating that food. This information is not intended to replace advice given to you by your health care provider. Make sure you discuss any questions you have with your health care provider. Document Released: 06/08/2010 Document Revised: 01/24/2017 Document Reviewed: 03/27/2016 Elsevier Patient Education  2020 [employer].

## 2018-12-08 ENCOUNTER — Telehealth: Payer: Self-pay | Admitting: Family Medicine

## 2018-12-08 DIAGNOSIS — I10 Essential (primary) hypertension: Secondary | ICD-10-CM

## 2018-12-08 MED ORDER — LISINOPRIL-HYDROCHLOROTHIAZIDE 20-25 MG PO TABS: 1.0000 | ORAL_TABLET | Freq: Every day | ORAL | 0 refills | Status: DC

## 2018-12-08 NOTE — Telephone Encounter (Signed)
done

## 2018-12-08 NOTE — Telephone Encounter (Signed)
Fax received from new pharmacy for a refill on Lisinopril-HCTZ at [id number] [place] [address] P# [phone]  F# [phone].

## 2018-12-09 ENCOUNTER — Other Ambulatory Visit: Payer: Self-pay | Admitting: Family Medicine

## 2018-12-09 DIAGNOSIS — M109 Gout, unspecified: Secondary | ICD-10-CM

## 2018-12-10 ENCOUNTER — Other Ambulatory Visit: Payer: Self-pay | Admitting: Family Medicine

## 2018-12-10 DIAGNOSIS — I10 Essential (primary) hypertension: Secondary | ICD-10-CM

## 2018-12-22 ENCOUNTER — Encounter: Payer: Self-pay | Admitting: Family Medicine

## 2019-01-27 ENCOUNTER — Telehealth: Payer: Self-pay | Admitting: *Deleted

## 2019-01-27 MED ORDER — ONDANSETRON 4 MG PO TBDP: 4.0000 mg | ORAL_TABLET | Freq: Three times a day (TID) | ORAL | 0 refills | Status: DC | PRN

## 2019-01-27 NOTE — Telephone Encounter (Signed)
Advise pt that I sent in zofran to use for nausea/vomiting.  Doesn't sound like it is reflux if the Dexilant isn't helping.  May be a GI bug or something else (gall bladder?) Try and stick with clear liquids tonight. Virtual visit tomorrow

## 2019-01-27 NOTE — Telephone Encounter (Signed)
Mark Caldwell called, they are in [place] and Mark Caldwell's GERD issues from a few years ago have come roaring back. He is in a huge amount of pain and having nausea since Monday. He can't keep anything down but not actually vomiting, dry heaves with white mucus. No fever. He has thrown his back out from all of the dry heaves. He had some Dexilant and it didn't help. All he has had to eat today was 4-5 crackers and some soup and some water and he is trying to stay hydrated. Offered the 3:30 virtual but he had a meeting. I did hold the 9:30 for him for tomorrow, which he did agree to. They wanted to know if you would be able to call anything now until he can see you tomorrow and if so can you write URGENT on it?

## 2019-01-27 NOTE — Telephone Encounter (Signed)
Patient advised.

## 2019-01-28 ENCOUNTER — Ambulatory Visit (INDEPENDENT_AMBULATORY_CARE_PROVIDER_SITE_OTHER): Payer: Medicare HMO | Admitting: Family Medicine

## 2019-01-28 ENCOUNTER — Other Ambulatory Visit: Payer: Self-pay

## 2019-01-28 ENCOUNTER — Encounter: Payer: Self-pay | Admitting: Family Medicine

## 2019-01-28 VITALS — BP 129/81 | HR 60 | Temp 97.6°F | Ht 71.5 in | Wt 221.0 lb

## 2019-01-28 DIAGNOSIS — K2900 Acute gastritis without bleeding: Secondary | ICD-10-CM | POA: Diagnosis not present

## 2019-01-28 NOTE — Progress Notes (Signed)
Start time: 9:38 End time: 9:56 Some glitches with both video and poor sound quality, so was converted to phone call for 2nd half of visit  Virtual Visit via Video Note  I connected with Mark Caldwell on 01/28/19 at  9:30 AM EST by a video enabled telemedicine application and verified that I am speaking with the correct person using two identifiers.  Location: Patient: mother's home in [place] Provider: office   I discussed the limitations of evaluation and management by telemedicine and the availability of in person appointments. The patient expressed understanding and agreed to proceed.  History of Present Illness:  Chief Complaint  Patient presents with  . Nausea    started Monday after dinner. Vomiting (dry heaves) felt like his stomach lining was a little less than on fire. White heavy mucus did come up. Has not been able to eat much. Got zofran last night at 7pm and dramatic change-feeling much better.    After dinner 3 days ago felt nauseated, went to bed early.  Very nauseated at night, some dry heaves.  Off/on throughout the day Tuesday, unable to eat dinner that night.  Symptoms worse at night, in bed.  Vomited since white, milky, oily fluid (no food).  Didn't eat or drink much yesterday, some water and crackers.  Took Zofran 7pm last night, and nausea resolved.  He took another one at 3am, and feels fine so far this morning.  Has only eaten a few crackers. +epigastric pain (felt on fire). Currently feels fine, no pain.  He took some Dexilant (thinks he waited too long).  Took it twice, not yesterday. Has 10-12 pills left.  It follows a similar pattern of GERD he has had in the past, where it can be severe and last a few days (not the typical GERD course for other people)  Very isolated in [place], limited exposures, doesn't think there is any significant COVID risk.  Pulled muscle in back the other day (from heaving), hurt a lot last night, feeling better now.  No fever (checked a few  times when felt hot). No diarrhea.  PMH, PSH, SH reviewed  Outpatient Encounter Medications as of 01/28/2019  Medication Sig  . allopurinol (ZYLOPRIM) 100 MG tablet TAKE 1 TABLET BY MOUTH EVERY DAY  . allopurinol (ZYLOPRIM) 300 MG tablet TAKE 1 TABLET BY MOUTH EVERY DAY  . aspirin EC 81 MG tablet Take 1 tablet (81 mg total) by mouth daily.  . B Complex-C (SUPER B COMPLEX PO) Take 1 tablet by mouth daily.  . Cholecalciferol (VITAMIN D) 2000 UNITS tablet Take 2,000 Units by mouth daily.  Marland Kitchen COENZYME Q10 PO Take by mouth daily.  Marland Kitchen lisinopril-hydrochlorothiazide (ZESTORETIC) 20-25 MG tablet TAKE 1 TABLET BY MOUTH EVERY DAY  . ondansetron (ZOFRAN ODT) 4 MG disintegrating tablet Take 1 tablet (4 mg total) by mouth every 8 (eight) hours as needed for nausea or vomiting.  . rosuvastatin (CRESTOR) 10 MG tablet TAKE 1 TABLET BY MOUTH EVERY DAY  . tamsulosin (FLOMAX) 0.4 MG CAPS capsule Take 0.4 mg by mouth daily.  . sildenafil (REVATIO) 20 MG tablet TAKE 2-5 TABLETS BY MOUTH DAILY AS NEEDED (Patient not taking: Reported on 12/07/2018)   No facility-administered encounter medications on file as of 01/28/2019.    No Known Allergies  ROS: no fever, chills, URI symptoms, cough, shortness of breath.   +epigastric pain, nausea/dry heaves per HPI. Denies loss of hearing/taste.  +back pain yesterday, better today. See HPI.    Observations/Objective:  BP  129/81   Pulse 60   Temp 97.6 F (36.4 C) (Oral)   Ht 5' 11.5" (1.816 m)   Wt 221 lb (100.2 kg)   BMI 30.39 kg/m   Pleasant, well-appearing male, in no distress He is alert, oriented.  Cranial nerves are grossly intact. Normal speech, eye contact, grooming. He palpated epigastrium and denied tenderness   Assessment and Plan:  Acute gastritis without hemorrhage, unspecified gastritis type - continue Dexilant once daily, zofran prn. Avoid NSAIDs. Red flag sx reviewed   Heating pad, Tylenol, avoid NSAIDs   Follow Up Instructions:    I  discussed the assessment and treatment plan with the patient. The patient was provided an opportunity to ask questions and all were answered. The patient agreed with the plan and demonstrated an understanding of the instructions.   The patient was advised to call back or seek an in-person evaluation if the symptoms worsen or if the condition fails to improve as anticipated.  I provided 18 minutes of non-face-to-face time during this encounter.   Vikki Ports, MD

## 2019-01-28 NOTE — Patient Instructions (Signed)
I'm glad the zofran helped and that you are feeling a little better. The differential diagnosis for your upper abdominal pain and nausea/vomiting is either a viral gastritis, or a bad flare of GERD.  If symptoms persist, then we should consider possible bacterial etiology (H.pylori, which can cause ulcers), possible gall bladder.  Continue to take the Dexilant once daily. Use the ondansetron (zofran) if needed for nausea/vomiting.  Stick to a bland diet--clear liquids, broth soups, some crackers; advance as tolerated to lowfat, bland diet (grilled chicken)--avoid fried foods, spicy foods, alcohol.  Seek immediate care if you have worsening/severe pain, blood or coffee-ground looking vomit, bloody or black bowel movements.  For any pain (like the muscle spasm in the back, stomach)--you should use tylenol (arthritis kind if okay); avoid all anti-inflammatories (do not take motrin/ibuprofen/advil/aleve, Goody or BC powder). You can also try heating pad or topical meds like Biofreeze.  I hope you feel back to normal soon!   Gastritis, Adult  Gastritis is swelling (inflammation) of the stomach. Gastritis can develop quickly (acute). It can also develop slowly over time (chronic). It is important to get help for this condition. If you do not get help, your stomach can bleed, and you can get sores (ulcers) in your stomach. What are the causes? This condition may be caused by:  Germs that get to your stomach.  Drinking too much alcohol.  Medicines you are taking.  Too much acid in the stomach.  A disease of the intestines or stomach.  Stress.  An allergic reaction.  Crohn's disease.  Some cancer treatments (radiation). Sometimes the cause of this condition is not known. What are the signs or symptoms? Symptoms of this condition include:  Pain in your stomach.  A burning feeling in your stomach.  Feeling sick to your stomach (nauseous).  Throwing up (vomiting).  Feeling too  full after you eat.  Weight loss.  Bad breath.  Throwing up blood.  Blood in your poop (stool). How is this diagnosed? This condition may be diagnosed with:  Your medical history and symptoms.  A physical exam.  Tests. These can include: ? Blood tests. ? Stool tests. ? A procedure to look inside your stomach (upper endoscopy). ? A test in which a sample of tissue is taken for testing (biopsy). How is this treated? Treatment for this condition depends on what caused it. You may be given:  Antibiotic medicine, if your condition was caused by germs.  H2 blockers and similar medicines, if your condition was caused by too much acid. Follow these instructions at home: Medicines  Take over-the-counter and prescription medicines only as told by your doctor.  If you were prescribed an antibiotic medicine, take it as told by your doctor. Do not stop taking it even if you start to feel better. Eating and drinking   Eat small meals often, instead of large meals.  Avoid foods and drinks that make your symptoms worse.  Drink enough fluid to keep your pee (urine) pale yellow. Alcohol use  Do not drink alcohol if: ? Your doctor tells you not to drink. ? You are pregnant, may be pregnant, or are planning to become pregnant.  If you drink alcohol: ? Limit your use to:  0-1 drink a day for women.  0-2 drinks a day for men. ? Be aware of how much alcohol is in your drink. In the [place] one drink equals one 12 oz bottle of beer (355 mL), one 5 oz glass of wine (148  mL), or one 1 oz glass of hard liquor (44 mL). General instructions  Talk with your doctor about ways to manage stress. You can exercise or do deep breathing, meditation, or yoga.  Do not smoke or use products that have nicotine or tobacco. If you need help quitting, ask your doctor.  Keep all follow-up visits as told by your doctor. This is important. Contact a doctor if:  Your symptoms get worse.  Your  symptoms go away and then come back. Get help right away if:  You throw up blood or something that looks like coffee grounds.  You have black or dark red poop.  You throw up any time you try to drink fluids.  Your stomach pain gets worse.  You have a fever.  You do not feel better after one week. Summary  Gastritis is swelling (inflammation) of the stomach.  You must get help for this condition. If you do not get help, your stomach can bleed, and you can get sores (ulcers).  This condition is diagnosed with medical history, physical exam, or tests.  You can be treated with medicines for germs or medicines to block too much acid in your stomach. This information is not intended to replace advice given to you by your health care provider. Make sure you discuss any questions you have with your health care provider. Document Released: 07/31/2007 Document Revised: 07/01/2017 Document Reviewed: 07/01/2017 Elsevier Patient Education  2020 [employer].

## 2019-01-29 ENCOUNTER — Telehealth: Payer: Self-pay | Admitting: Family Medicine

## 2019-01-29 ENCOUNTER — Other Ambulatory Visit: Payer: Self-pay

## 2019-01-29 MED ORDER — ONDANSETRON 4 MG PO TBDP: ORAL_TABLET | ORAL | 0 refills | Status: DC

## 2019-01-29 NOTE — Telephone Encounter (Signed)
Pt wife called and states that pt was feeling 100 percent better at 7 am this morning ate some rice and chicken with broth then at 11 am and took a turn and starting having nausea and and vomiting and starting throwing up white mucous, she is wanting to know if there is something else something stronger he can take than the zofran, states [place] are still in [place], she can be reached at [phone] and also states they are like 30 min away from a urgent care, and the pharmacy is  [id number] - [place], [place] - [address] AT [facility], informed pt that you are out of the office today

## 2019-01-29 NOTE — Telephone Encounter (Signed)
Patient wife was informed. She stated he only has 4 left so he will need a refill called in. Is this ok?

## 2019-01-29 NOTE — Telephone Encounter (Signed)
done

## 2019-01-29 NOTE — Telephone Encounter (Signed)
Okay to refill, with directions stating 1-2 (instead of just 1), with a quantity of 20

## 2019-01-29 NOTE — Telephone Encounter (Signed)
Please advise pt or wife that if needed, he could take two of the zofran. I gave him the lower 4mg  dose, which had been effective per our conversation yesterday.  He can take UP TO 8mg  at a time, if needed.  If that isn't effective, there are other medications for nausea such as phenergan and compazine, which are more sedating.  But if he continues to have issues over the next few days, he may want to consider seeking in-person evaluation (not just ordering bloodwork, but actually having a doctor examine him, in order to make sure other studies such as scans of his abdomen, aren't also needed).

## 2019-02-08 ENCOUNTER — Ambulatory Visit (INDEPENDENT_AMBULATORY_CARE_PROVIDER_SITE_OTHER): Payer: Medicare HMO | Admitting: Family Medicine

## 2019-02-08 ENCOUNTER — Encounter: Payer: Self-pay | Admitting: Family Medicine

## 2019-02-08 ENCOUNTER — Other Ambulatory Visit: Payer: Self-pay

## 2019-02-08 VITALS — BP 131/74 | HR 67 | Temp 98.7°F | Ht 71.5 in | Wt 209.0 lb

## 2019-02-08 DIAGNOSIS — R112 Nausea with vomiting, unspecified: Secondary | ICD-10-CM

## 2019-02-08 DIAGNOSIS — I10 Essential (primary) hypertension: Secondary | ICD-10-CM

## 2019-02-08 DIAGNOSIS — R509 Fever, unspecified: Secondary | ICD-10-CM

## 2019-02-08 NOTE — Progress Notes (Signed)
Start time: 9:35 End time: 10:00  Changed to telephone visit after a few minutes, as patient reported that my voice sounded crackly--tried connecting twice, but sound was bad (I could hear him fine).   Virtual Visit via Video Note  I connected with Dimitri Schwarz on 02/08/19 at  9:30 AM EST by a video enabled telemedicine application and verified that I am speaking with the correct person using two identifiers.  Location: Patient: mom's house in [place].  Wife, Stanton Kidney was also present and part of the conversation Provider: office   I discussed the limitations of evaluation and management by telemedicine and the availability of in person appointments. The patient expressed understanding and agreed to proceed.  History of Present Illness:  Chief Complaint  Patient presents with  . Follow-up    VIRTUAL over the nausea and vomiting for the last 48hrs without any meds. But still has a fever. Doesn't feel bad, taking 1000mg  when he has temp to knock it out. Has not taken his regular meds in about a week. Temp was still 99+ even after 1000mg  of tylenol Friday.    Pt was seen 12/3 (virtually) with nausea, dry heaves. He was diagnosed with possible gastritis, treated with dexilant once daily and zofran for nausea, to avoid NSAIDs. He reports that his symptoms continued off and on through this past Friday afternoon.  He has had no nausea or vomiting since Friday. Fever Friday night was 99.6 or 99.7 (which may have been after taking tylenol.) Tmax 100.1 last night.  He has no pain at all, but between 3-6pm he runs a fever. He doesn't feel bad. He has eaten very little for 2 weeks.  He is nervous about eating.  Eating jello, saltine crackers, and some soup. He reports that food "doesn't sit well", sits like a rock.   He is planning to eat more today, to test things out.  He was constipated x 5 days, but this resolved in the last 36 hours, so stomach feels better. Denies urinary symptoms, staying  hydrated. Denies prostate symptoms (despite not taking any of his meds, including his flomax). Going frequently during the day, 2x/night.  He spoke to Dr. Redmond School on Friday night (on call physician), and they have some questions/concerns about after hours contact.  Mom fell, fractured pelvis, needs constant care and contact. Asking if he could be contagious.  PMH, PSH, SH reviewed  Outpatient Encounter Medications as of 02/08/2019  Medication Sig Note  . acetaminophen (TYLENOL) 500 MG tablet Take 1,000 mg by mouth every 6 (six) hours as needed. 02/08/2019: 8pm last night.  Marland Kitchen allopurinol (ZYLOPRIM) 100 MG tablet TAKE 1 TABLET BY MOUTH EVERY DAY (Patient not taking: Reported on 02/08/2019)   . allopurinol (ZYLOPRIM) 300 MG tablet TAKE 1 TABLET BY MOUTH EVERY DAY (Patient not taking: Reported on 02/08/2019)   . aspirin EC 81 MG tablet Take 1 tablet (81 mg total) by mouth daily. (Patient not taking: Reported on 02/08/2019)   . B Complex-C (SUPER B COMPLEX PO) Take 1 tablet by mouth daily.   . Cholecalciferol (VITAMIN D) 2000 UNITS tablet Take 2,000 Units by mouth daily.   Marland Kitchen COENZYME Q10 PO Take by mouth daily.   Marland Kitchen lisinopril-hydrochlorothiazide (ZESTORETIC) 20-25 MG tablet TAKE 1 TABLET BY MOUTH EVERY DAY (Patient not taking: Reported on 02/08/2019)   . ondansetron (ZOFRAN ODT) 4 MG disintegrating tablet Take 1-2 tablets daily as needed. (Patient not taking: Reported on 02/08/2019)   . rosuvastatin (CRESTOR) 10 MG tablet TAKE  1 TABLET BY MOUTH EVERY DAY (Patient not taking: Reported on 02/08/2019)   . sildenafil (REVATIO) 20 MG tablet TAKE 2-5 TABLETS BY MOUTH DAILY AS NEEDED (Patient not taking: Reported on 12/07/2018)   . tamsulosin (FLOMAX) 0.4 MG CAPS capsule Take 0.4 mg by mouth daily.    No facility-administered encounter medications on file as of 02/08/2019.   Hasn't taken his regular meds over the last week, per CC/HPI  No Known Allergies   ROS:  +fever and GI complaints per HPI.   Denies headaches, dizziness. Has an appetite, but afraid to eat, including taking his medications. No mucus or blood in the stool. Constipation resolved. Denies URI symptoms, cough, shortness of breath. Denies rashes, skin concerns, boils. No other symptoms of any sort of infection/source of fever.  Observations/Objective:  BP 131/74   Pulse 67   Temp 98.7 F (37.1 C) (Oral)   Ht 5' 11.5" (1.816 m)   Wt 209 lb (94.8 kg)   BMI 28.74 kg/m   Briefly viewed on video--well-appearing.  He is in no distress. He is alert, oriented. Cranial nerves grossly intact. Exam limited due to virtual nature of the visit.  Assessment and Plan:  Fever, unspecified fever cause - unclear etiology--no symptoms other than recent GI complaints (nausea/vomiting, abd pain)  Non-intractable vomiting with nausea, unspecified vomiting type - This resolved a few days ago, though diet has been very limited.   Essential hypertension, benign - BP okay despite not taking meds--encouraged him to restart his normal medications  Recommended he eat regular foods (bland, small quantities) Monitor fever  Concerned due to inability to eat normally, and development of low grade fever. Recommend UC/ER evaluation--labs, imaging to determine what is causing his symptoms. Initially thought viral gastritis, but that should have resolved, and no ongoing fever. Concerns include other GI etiology, including gall bladder.  All questions from pt and wife answered.   Follow Up Instructions:    I discussed the assessment and treatment plan with the patient. The patient was provided an opportunity to ask questions and all were answered. The patient agreed with the plan and demonstrated an understanding of the instructions.   The patient was advised to call back or seek an in-person evaluation if the symptoms worsen or if the condition fails to improve as anticipated.  I provided 25 minutes of non-face-to-face time during this  encounter.   Vikki Ports, MD

## 2019-02-09 ENCOUNTER — Other Ambulatory Visit: Payer: Self-pay | Admitting: Family Medicine

## 2019-02-09 DIAGNOSIS — N529 Male erectile dysfunction, unspecified: Secondary | ICD-10-CM

## 2019-02-09 NOTE — Patient Instructions (Signed)
I am concerned that you are still not able to eat normally, and are now having fevers.  I'd be less concerned about a low grade fever if you were back to feeling normal, and eating a normal diet.  I'm concerned that once you start eating, you will have recurrent nausea and abdominal pain, because there is still some underlying process going on, which is also likely causing the fever.  At last visit we discussed a possible viral gastritis, but now I'm concerned there might be something else going on (whether it is related to gall bladder, or something else).  Since it isn't easy to get you in with a gastroenterologist, it is my recommendation that if once you try eating, if your nausea/vomiting and/or discomfort recur, that you be evaluated in person at either an urgent are or emergency room.  They will be able to do a physical exam, labs, and determine if you need imaging (ultrasound or CT), and get you set up with a gastroenterologist much sooner than you'd be able to on your own, if appropriate.  You are being an amazing son--but I need you to take care of YOURSELF too.  We discussed starting back with small quantities of regular, bland foods, and continue to monitor for symptoms and fever.  Keep me updated.  I hope you feel better soon and that your mom recovers from her fracture.

## 2019-02-09 NOTE — Telephone Encounter (Signed)
Is this okay to refill? 

## 2019-02-11 ENCOUNTER — Telehealth: Payer: Self-pay | Admitting: Family Medicine

## 2019-02-11 DIAGNOSIS — M109 Gout, unspecified: Secondary | ICD-10-CM

## 2019-02-11 MED ORDER — ALLOPURINOL 100 MG PO TABS: 100.0000 mg | ORAL_TABLET | Freq: Every day | ORAL | 0 refills | Status: DC

## 2019-02-11 NOTE — Telephone Encounter (Signed)
Done

## 2019-02-11 NOTE — Telephone Encounter (Signed)
Pharmacy sent refill request for Allopurinol 100 mg [id number] - [place], [place] - [address] [facility] [address]

## 2019-02-12 ENCOUNTER — Telehealth: Payer: Self-pay

## 2019-02-12 ENCOUNTER — Encounter: Payer: Self-pay | Admitting: Family Medicine

## 2019-02-12 ENCOUNTER — Telehealth: Payer: Self-pay | Admitting: Family Medicine

## 2019-02-12 NOTE — Telephone Encounter (Signed)
This was refilled yesterday.  Check that fax was truly refill request, rather than another comment (if unavailable, etc).  If just refill request, looks like it was done 12/17 by V.

## 2019-02-12 NOTE — Telephone Encounter (Signed)
Received [facility] from [facility] [place], [place] for a refill on Allopurinol 100mg . Last apt. 02/08/19

## 2019-02-12 NOTE — Telephone Encounter (Signed)
Pt called and wanted to give you and update. He said he has no fever, no vomiting and is fully recovered.

## 2019-03-01 ENCOUNTER — Other Ambulatory Visit: Payer: Self-pay | Admitting: Family Medicine

## 2019-03-01 DIAGNOSIS — M109 Gout, unspecified: Secondary | ICD-10-CM

## 2019-03-04 ENCOUNTER — Other Ambulatory Visit: Payer: Self-pay | Admitting: Family Medicine

## 2019-03-04 DIAGNOSIS — N529 Male erectile dysfunction, unspecified: Secondary | ICD-10-CM

## 2019-03-08 ENCOUNTER — Telehealth: Payer: Self-pay | Admitting: *Deleted

## 2019-03-08 DIAGNOSIS — N529 Male erectile dysfunction, unspecified: Secondary | ICD-10-CM

## 2019-03-08 MED ORDER — SILDENAFIL CITRATE 20 MG PO TABS: ORAL_TABLET | ORAL | 1 refills | Status: DC

## 2019-03-08 NOTE — Telephone Encounter (Signed)
Patient left me a vm on Friday and he does need the sildenfil refill that I denied on Thursday sent to pharmacy in [place].

## 2019-03-11 ENCOUNTER — Other Ambulatory Visit: Payer: Self-pay | Admitting: Family Medicine

## 2019-03-11 ENCOUNTER — Other Ambulatory Visit: Payer: Self-pay | Admitting: *Deleted

## 2019-03-11 DIAGNOSIS — I25119 Atherosclerotic heart disease of native coronary artery with unspecified angina pectoris: Secondary | ICD-10-CM

## 2019-03-11 DIAGNOSIS — I10 Essential (primary) hypertension: Secondary | ICD-10-CM

## 2019-03-11 DIAGNOSIS — E7800 Pure hypercholesterolemia, unspecified: Secondary | ICD-10-CM

## 2019-03-11 MED ORDER — LISINOPRIL-HYDROCHLOROTHIAZIDE 20-25 MG PO TABS: 1.0000 | ORAL_TABLET | Freq: Every day | ORAL | 0 refills | Status: DC

## 2019-03-11 MED ORDER — ROSUVASTATIN CALCIUM 10 MG PO TABS: 10.0000 mg | ORAL_TABLET | Freq: Every day | ORAL | 0 refills | Status: DC

## 2019-03-11 NOTE — Telephone Encounter (Signed)
Are these okay to refill? Patient had AWV virtually in April and was supposed to have 6 month follow up but is still in [place]. Please advise, thanks.

## 2019-03-11 NOTE — Telephone Encounter (Signed)
Left message for patient to please return my call.

## 2019-03-11 NOTE — Telephone Encounter (Signed)
Ok to refill both for 90d.  Please schedule for AWV for April.  (He had labs and virtual OV done 11/2018) If he is still OOT, can do virtually.  Send him my best and hope they (including his mom, who had fallen in December) are all doing well.

## 2019-03-18 ENCOUNTER — Telehealth: Payer: Self-pay

## 2019-03-18 NOTE — Telephone Encounter (Signed)
I submitted a PA for the pts. Sildenafil and it was denied by the insurance. I have noticed any PA I submit for ED medications is always denied and th epts. Must pay out of pocket for these types of medications.

## 2019-03-18 NOTE — Telephone Encounter (Signed)
You are correct.  You should never do prior auth for sildenafil.  This is a generic medication which has a DIFFERENT indication, and will not get approved for ED.  When we prescribe it to the patients, we let them know that it is CASH PAY (and for them to check around for the cheapest price).  So, don't waste your time on sildenafil (not for other ED meds which may truly need auth).

## 2019-03-18 NOTE — Telephone Encounter (Signed)
Ok thank you 

## 2019-03-25 ENCOUNTER — Telehealth: Payer: Self-pay | Admitting: Family Medicine

## 2019-03-25 NOTE — Telephone Encounter (Signed)
Pt emailed labs for you to review put in your folder pt can be reached at [phone] with any questions

## 2019-03-29 NOTE — Telephone Encounter (Signed)
Left all info in patient's voicemail and let him know he could call back with any specific questions. We laready have an appt set up for 06/09/19 for AWV.

## 2019-03-29 NOTE — Telephone Encounter (Signed)
Labs reviewed-- Lipids were done 11/2018, still good (not sure why he elected to have them repeated). TC 137, HDL 54, TG 88, LDL 66, chol/HDL ratio 2.5  PSA 5.1 (down from 7.6 in 05/2018)  Normal CBC, c-met Fasting glucose 104 (down from 113) A1c 5.6 Uric acid 5.7 Normal u/a  Advise pt--labs reviewed.  Sugar improved (still slightly elevated, pre-diabetic; A1c normal). PSA down from last check, still elevated. Ensure that he also sends this to his urologist, Dr. Diona Fanti.  In reviewing chart, looks like he will be due for AWV in April.  Let's go ahead and set that up (virtual; if he ends up being back in [place], we can do in person).

## 2019-05-05 ENCOUNTER — Other Ambulatory Visit: Payer: Self-pay | Admitting: Family Medicine

## 2019-05-05 DIAGNOSIS — M109 Gout, unspecified: Secondary | ICD-10-CM

## 2019-05-24 ENCOUNTER — Encounter: Payer: Self-pay | Admitting: Family Medicine

## 2019-06-03 ENCOUNTER — Other Ambulatory Visit: Payer: Self-pay | Admitting: Family Medicine

## 2019-06-03 DIAGNOSIS — E7800 Pure hypercholesterolemia, unspecified: Secondary | ICD-10-CM

## 2019-06-03 DIAGNOSIS — I25119 Atherosclerotic heart disease of native coronary artery with unspecified angina pectoris: Secondary | ICD-10-CM

## 2019-06-03 DIAGNOSIS — I10 Essential (primary) hypertension: Secondary | ICD-10-CM

## 2019-06-08 NOTE — Patient Instructions (Addendum)
  HEALTH MAINTENANCE RECOMMENDATIONS:  It is recommended that you get at least 30 minutes of aerobic exercise at least 5 days/week (for weight loss, you may need as much as 60-90 minutes). This can be any activity that gets your heart rate up. This can be divided in 10-15 minute intervals if needed, but try and build up your endurance at least once a week.  Weight bearing exercise is also recommended twice weekly.  Eat a healthy diet with lots of vegetables, fruits and fiber.  "Colorful" foods have a lot of vitamins (ie green vegetables, tomatoes, red peppers, etc).  Limit sweet tea, regular sodas and alcoholic beverages, all of which has a lot of calories and sugar.  Up to 2 alcoholic drinks daily may be beneficial for men (unless trying to lose weight, watch sugars).  Drink a lot of water.  Sunscreen of at least SPF 30 should be used on all sun-exposed parts of the skin when outside between the hours of 10 am and 4 pm (not just when at beach or pool, but even with exercise, golf, tennis, and yard work!)  Use a sunscreen that says "broad spectrum" so it covers both UVA and UVB rays, and make sure to reapply every 1-2 hours.  Remember to change the batteries in your smoke detectors when changing your clock times in the spring and fall. Carbon monoxide detectors are recommended for your home.  Use your seat belt every time you are in a car, and please drive safely and not be distracted with cell phones and texting while driving.    Mark Caldwell , Thank you for taking time to come for your Medicare Wellness Visit. I appreciate your ongoing commitment to your health goals. Please review the following plan we discussed and let me know if I can assist you in the future.   This is a list of the screening recommended for you and due dates:  Health Maintenance  Topic Date Due  . Tetanus Vaccine  02/23/2019  . Colon Cancer Screening  08/15/2019  . Flu Shot  09/26/2019  .  Hepatitis C: One time  screening is recommended by Center for Disease Control  (CDC) for  adults born from [birth year] through 1963.   Completed  . Pneumonia vaccines  Completed   Please get tetanus shot (TdaP) from the pharmacy. Continue yearly flu shots (in the fall, not 8/1 as stated above).  Colon cancer screening is due soon. We discussed options including repeat colonoscopy with Dr. Penelope Coop [facility]), versus Cologuard screening.  At your convenience, please return notarized copies of the living will and healthcare power of attorney.

## 2019-06-08 NOTE — Progress Notes (Signed)
Chief Complaint  Patient presents with  . Medicare Wellness    nonfasting AWV/med check plus. No new concerns. Did not get Tdap vaccine.     Mark Caldwell is a 74 y.o. male who presents for annual physical exam, Medicare wellness visit and follow-up on chronic medical conditions.   He has the following concerns:  Patient has been caring for his elderly mother in [place] over the last year.  This is the first in-person visit since 02/2018. She recently moved into a facility in [place].  He visits her most days of the week.  He plans to stay in her house on the [place] much of the time. He has worked more than ever over the last year, and is ready to try and slow down. No longer traveling, does a lot over Zoom, hasn't had much time to exercise.  He had an illness in December with fever, nausea, vomiting and abdominal pain.  This subsided and hasn't recurred.  Lasted about 2 weeks. He felt like it was all due to GERD (and his "fondness for rich foods and alcohol").  No further problems, and is more careful with his diet now.  Patient was noted to have atherosclerosis in coronary arteries on CT. In January 2020 he was started on ASA, Crestor 10mg , and he was referred to Dr. Marlou Porch.He is tolerating Crestor (taking with CoQ10), and was at goal on last check.  He denies changes to diet. Last lipids were done 02/2019: TC 137, LDL 66, HDL 54 He underwent a stress test with Dr. Marlou Porch in 06/2018 which was low risk. No changes to his regimen were made.   He denies any chest pain, DOE.  Hypertension:Denies headaches, dizziness, edema, chest pain, palpitations, shortness of breath.He is compliant with lisinopril HCTZ and denies side effects. BP's aren't check in [place].  Usually 120/80 range at other doctors, or less. He reports that it was 112/80 at Dr. Alan Ripper this morning. He is happy with BPs and prefers not to change medications to lower uric acid (discussed in the past as well, aware that HCTZ can  increase levels and his allopurinol dose may be higher due to this).  Gout: Last year he reportedhavng problems with flares related to long drives. We increased his allopurinol dose from 300mg  to 400mg , and recheck uric acid was at goal. Uric acid level was up to 6.5 in 11/2018, so allopurinol dose was titrated up to 500mg  (split BID, taking 300/200).  Recheck of uric acid was improved at 5.7 in 02/2019. He reports he cut back the dose after last labs.  He hasn't had any flares.  Asking whether he should stay at 400mg  or go back to 500mg .  Some knee pain flares periodically, improved with injections in the past.  He has been seen at [facility] in [place] twice related to his left knee.  12/2018 he was treated with prednisone, had L knee effusion.  02/2019 he had x-rays done which were normal. He reports he had another knee injection, which helped.  Only occasional discomfort with long car rides, in the L knee related to driving position.  IFG: fasting glucose was 104 and A1c was 5.6 in 02/2019. A1c was 6.1 in 02/2018. He limits her carbs, though since living with his 10 yo mother, she likes them, so he has been eating more "white" foods, including more cookies.  Diet has been somewhat better since mom moved into facility.   Prostate cancer: under surveillance by Dr. Diona Fanti. He last  had biopsy 01/2018, 3 of the 12 areas biopsied showed cancer, the worst was 3+4=7, and he other 2 were 3+3=6. Last PSA was 5.1 in 02/2019 (down from 7.6 in 05/2018). Saw Dr. Diona Fanti this morning.  Trying to get MRI approved for the Fall, and also plans repeat biopsy then.  Erectile dysfunction:  Controlled with sildenafil.  Immunization History  Administered Date(s) Administered  . Hepatitis A 01/27/2006  . Influenza Split 01/10/2009, 01/03/2011, 01/06/2012  . Influenza, High Dose Seasonal PF 02/11/2013, 01/31/2014, 02/02/2015, 02/05/2016, 11/23/2016, 11/08/2017  . Influenza,inj,Quad PF,6+ Mos 12/05/2018  . Influenza-Unspecified  12/05/2018  . PFIZER SARS-COV-2 Vaccination 04/07/2019, 04/27/2019  . Pneumococcal Conjugate-13 02/11/2013  . Pneumococcal Polysaccharide-23 01/31/2014  . Tdap 02/22/2009  . Zoster 01/25/2006  . Zoster Recombinat (Shingrix) 06/25/2016, 11/23/2016   He reported having more travel vaccines prior to going to [place] Last colonoscopy: 2011, normal per patient  Last PSA: 5.1 in 02/2019 Dentist: yearly, yesterday Ophtho: yearly, scheduled next week Derm: sees Dr. Delman Cheadle twice yearly, scheduled for next week. Exercise:not much  Other Doctors caring for patient: Ophtho: Dr. Jerline Pain at [facility] Urology: Dr. Diona Fanti Dermatology: Dr. Jari Pigg Dentist: Dr. Lurlean Horns: Dr. Rhona Raider GI: Dr. Penelope Coop Cardiologist: Dr. Marlou Porch  Fall Screen: no falls Depression screen: Negative Functional status survey: notable for some trouble with night vision (eye appt is next week) Mini-Cog screen: normal  See epic for full screens.  He reports having Healthcare power of attorney and living will.    PMH, PSH, SH and FH reviewed and updated.  Outpatient Encounter Medications as of 06/09/2019  Medication Sig  . allopurinol (ZYLOPRIM) 100 MG tablet TAKE 1 TABLET BY MOUTH EVERY DAY  . allopurinol (ZYLOPRIM) 300 MG tablet TAKE 1 TABLET BY MOUTH EVERY DAY  . aspirin EC 81 MG tablet Take 1 tablet (81 mg total) by mouth daily.  . B Complex-C (SUPER B COMPLEX PO) Take 1 tablet by mouth daily.  . Cholecalciferol (VITAMIN D) 2000 UNITS tablet Take 2,000 Units by mouth daily.  Marland Kitchen COENZYME Q10 PO Take by mouth daily.  Marland Kitchen lisinopril-hydrochlorothiazide (ZESTORETIC) 20-25 MG tablet TAKE 1 TABLET BY MOUTH EVERY DAY  . rosuvastatin (CRESTOR) 10 MG tablet TAKE 1 TABLET BY MOUTH EVERY DAY  . sildenafil (REVATIO) 20 MG tablet TAKE 2 TO 5 TABLETS BY MOUTH DAILY AS NEEDED  . tamsulosin (FLOMAX) 0.4 MG CAPS capsule Take 0.4 mg by mouth daily.  Marland Kitchen acetaminophen (TYLENOL) 500 MG tablet Take 1,000 mg by mouth every 6  (six) hours as needed.  . ondansetron (ZOFRAN ODT) 4 MG disintegrating tablet Take 1-2 tablets daily as needed. (Patient not taking: Reported on 02/08/2019)  . [DISCONTINUED] lisinopril-hydrochlorothiazide (ZESTORETIC) 20-25 MG tablet Take 1 tablet by mouth daily.  . [DISCONTINUED] rosuvastatin (CRESTOR) 10 MG tablet Take 1 tablet (10 mg total) by mouth daily.   No facility-administered encounter medications on file as of 06/09/2019.   No Known Allergies  ROS: The patient denies anorexia, fever, headaches, decreased hearing, ear pain, hoarseness, chest pain, palpitations, dizziness, syncope, dyspnea on exertion, cough, swelling, nausea, vomiting, diarrhea, constipation, abdominal pain, melena, hematochezia, hematuria, incontinence, nocturia, dysuria, genital lesions, numbness, tingling, weakness, tremor, depression, anxiety, abnormal bleeding/bruising, or enlarged lymph nodes  +ED, medications are effective Intermittent L knee pain (mild with long drives, has required periodic steroid injections); not currently having any pain No further abdominal pain. Some decrease in night vision.   PHYSICAL EXAM:  BP 110/70   Pulse 72   Temp 98.2 F (36.8 C) (  Other (Comment))   Ht 5\' 11"  (1.803 m)   Wt 230 lb 9.6 oz (104.6 kg)   BMI 32.16 kg/m   Wt Readings from Last 3 Encounters:  06/09/19 230 lb 9.6 oz (104.6 kg)  02/08/19 209 lb (94.8 kg)  01/28/19 221 lb (100.2 kg)    General Appearance:  Alert, cooperative, no distress, appears stated age   Head:  Normocephalic, without obvious abnormality, atraumatic   Eyes:  PERRL, conjunctiva/corneas clear, EOM's intact, fundibenign, cataract noted on left. 1-1.52mm flesh colored mole on right lower eye lid, in center  Ears:  Scarring (white appearance) of L TM, normal EAC. R--normal TM.  ?small polyp deep in canal, in front of TM, anteriorly  Nose:  Not examined, wearing mask due to COVID-19 pandemic  Throat:  Not examined, wearing  mask due to COVID-19 pandemic  Neck:  Supple, no lymphadenopathy; thyroid: no enlargement/tenderness/ nodules; no carotid bruit or JVD   Back:  Spine nontender, no curvature, ROM normal, no CVA tenderness   Lungs:  Clear to auscultation bilaterally without wheezes, rales or ronchi; respirations unlabored   Chest Wall:  No tenderness or deformity   Heart:  Regular rate and rhythm, S1 and S2 normal, no murmur, rub or gallop   Breast Exam:  No chest wall tenderness, masses or gynecomastia   Abdomen:  Soft, non-tender, nondistended, normoactive bowel sounds, no masses, no hepatosplenomegaly. +abdominal obesity   Genitalia:  Deferred to urology  Rectal:  Deferred to urology   Extremities:  No clubbing, cyanosis or edema   Pulses:  2+ and symmetric all extremities   Skin:  Skin color, texture, turgor normal, no rashes or lesions. Exam limited, he did not change into gown (seeing derm next week).  Ecchymosis/purpura noted at mid-left forearm  Lymph nodes:  Cervical, supraclavicular, and axillary nodes normal   Neurologic:  CNII-XII intact, normal strength, sensation and gait; reflexes 2+ and symmetric throughout    Psych: Normal mood, affect, hygiene and grooming   ASSESSMENT/PLAN:  Annual physical exam  Medicare annual wellness visit, subsequent  Gout of foot, unspecified cause, unspecified chronicity, unspecified laterality - no recent flares of gout (also may affect knee); currently taking 400mg  allopurinol .Recheck level to see if Uric acid<6 - Plan: Uric acid  Essential hypertension, benign - well controlled; declines changing HCTZ to lower uric acid  Erectile dysfunction, unspecified erectile dysfunction type  IFG (impaired fasting glucose) - low carb diet, daily exercise and weight loss recommended  Atherosclerosis of native coronary artery of native heart without angina pectoris - cont ASA, statin  Prostate  cancer ([place]) - under active surveillance, saw Dr. Diona Fanti earlier today. Plan is for repeat biopsy in the Fall (and MRI if can get covered)  Pure hypercholesterolemia - lipids at goal in 02/2019. Cont Crestor  Class 1 obesity due to excess calories with serious comorbidity and body mass index (BMI) of 32.0 to 32.9 in adult - counseled re: diet/exercise, weight loss encouraged  Medication monitoring encounter - Plan: Uric acid  Senile purpura ([place]) - suspect aspirin use contributes  Colon cancer screening - due in June. Discussed colonoscopy vs Cologuard. Due to living in [place], prefers Cologuard (to be sent there in June)   Recommended at least 30 minutes of aerobic activity at least 5 days/week(150 min), weight-bearing exercise 2x/wk; proper sunscreen use reviewed; healthy diet and alcohol recommendations (less than or equal to 2 drinks/day) reviewed; regular seatbelt use; changing batteries in smoke detectors. Self-testicular exams. Immunization recommendations discussed--continue yearly high  dose flu shots. TdaP due, to get from the pharmacy. Colon cancer screening is due in June. Discussed repeat colonoscopy with Dr. Penelope Coop, vs Cologuard, prefers Cologuard.  MOST formcompleted. Full Code, Full Care.  FTF time 1 hour, plus additional time spent in chart review and documentation.  WILL NEED [place] COD ADDRESS FOR COLOGUARD REFERRAL--not due until June. (Nurse advised of this--to make note)  Medicare Attestation I have personally reviewed: The patient's medical and social history Their use of alcohol, tobacco or illicit drugs Their current medications and supplements The patient's functional ability including ADLs,fall risks, home safety risks, cognitive, and hearing and visual impairment Diet and physical activities Evidence for depression or mood disorders  The patient's weight, height, BMI have been recorded in the chart.  I have made referrals, counseling, and provided education to  the patient based on review of the above and I have provided the patient with a written personalized care plan for preventive services.

## 2019-06-09 ENCOUNTER — Ambulatory Visit (INDEPENDENT_AMBULATORY_CARE_PROVIDER_SITE_OTHER): Payer: Medicare HMO | Admitting: Family Medicine

## 2019-06-09 ENCOUNTER — Encounter: Payer: Self-pay | Admitting: Family Medicine

## 2019-06-09 ENCOUNTER — Other Ambulatory Visit: Payer: Self-pay

## 2019-06-09 VITALS — BP 110/70 | HR 72 | Temp 98.2°F | Ht 71.0 in | Wt 230.6 lb

## 2019-06-09 DIAGNOSIS — N529 Male erectile dysfunction, unspecified: Secondary | ICD-10-CM | POA: Diagnosis not present

## 2019-06-09 DIAGNOSIS — Z1211 Encounter for screening for malignant neoplasm of colon: Secondary | ICD-10-CM

## 2019-06-09 DIAGNOSIS — I2510 Atherosclerotic heart disease of native coronary artery without angina pectoris: Secondary | ICD-10-CM

## 2019-06-09 DIAGNOSIS — M109 Gout, unspecified: Secondary | ICD-10-CM

## 2019-06-09 DIAGNOSIS — E6609 Other obesity due to excess calories: Secondary | ICD-10-CM

## 2019-06-09 DIAGNOSIS — Z0000 Encounter for general adult medical examination without abnormal findings: Secondary | ICD-10-CM

## 2019-06-09 DIAGNOSIS — D692 Other nonthrombocytopenic purpura: Secondary | ICD-10-CM

## 2019-06-09 DIAGNOSIS — I10 Essential (primary) hypertension: Secondary | ICD-10-CM

## 2019-06-09 DIAGNOSIS — C61 Malignant neoplasm of prostate: Secondary | ICD-10-CM

## 2019-06-09 DIAGNOSIS — R7301 Impaired fasting glucose: Secondary | ICD-10-CM | POA: Diagnosis not present

## 2019-06-09 DIAGNOSIS — Z6832 Body mass index (BMI) 32.0-32.9, adult: Secondary | ICD-10-CM

## 2019-06-09 DIAGNOSIS — E7800 Pure hypercholesterolemia, unspecified: Secondary | ICD-10-CM

## 2019-06-09 DIAGNOSIS — Z5181 Encounter for therapeutic drug level monitoring: Secondary | ICD-10-CM

## 2019-06-10 LAB — URIC ACID: Uric Acid: 5.9 mg/dL (ref 3.8–8.4)

## 2019-06-14 ENCOUNTER — Encounter: Payer: Self-pay | Admitting: Family Medicine

## 2019-07-23 ENCOUNTER — Other Ambulatory Visit: Payer: Self-pay | Admitting: Family Medicine

## 2019-07-23 DIAGNOSIS — M109 Gout, unspecified: Secondary | ICD-10-CM

## 2019-07-30 ENCOUNTER — Other Ambulatory Visit: Payer: Self-pay | Admitting: Family Medicine

## 2019-07-30 DIAGNOSIS — M109 Gout, unspecified: Secondary | ICD-10-CM

## 2019-08-09 ENCOUNTER — Other Ambulatory Visit: Payer: Self-pay | Admitting: *Deleted

## 2019-08-09 DIAGNOSIS — Z1211 Encounter for screening for malignant neoplasm of colon: Secondary | ICD-10-CM

## 2019-08-10 ENCOUNTER — Other Ambulatory Visit: Payer: Self-pay | Admitting: Urology

## 2019-08-10 DIAGNOSIS — C61 Malignant neoplasm of prostate: Secondary | ICD-10-CM

## 2019-08-27 LAB — COLOGUARD
COLOGUARD: NEGATIVE
Cologuard: NEGATIVE

## 2019-08-28 ENCOUNTER — Other Ambulatory Visit: Payer: Self-pay | Admitting: Family Medicine

## 2019-08-28 DIAGNOSIS — I25119 Atherosclerotic heart disease of native coronary artery with unspecified angina pectoris: Secondary | ICD-10-CM

## 2019-08-28 DIAGNOSIS — I10 Essential (primary) hypertension: Secondary | ICD-10-CM

## 2019-08-28 DIAGNOSIS — E7800 Pure hypercholesterolemia, unspecified: Secondary | ICD-10-CM

## 2019-09-01 ENCOUNTER — Encounter: Payer: Self-pay | Admitting: *Deleted

## 2019-09-02 ENCOUNTER — Telehealth: Payer: Self-pay | Admitting: Family Medicine

## 2019-09-02 NOTE — Telephone Encounter (Signed)
Pt mailed original Advance Directives to practice. Those were scanned into [place] [address] per pt's instructions.

## 2019-09-03 ENCOUNTER — Encounter: Payer: Self-pay | Admitting: Family Medicine

## 2019-09-14 ENCOUNTER — Other Ambulatory Visit: Payer: Self-pay | Admitting: Family Medicine

## 2019-09-14 DIAGNOSIS — N529 Male erectile dysfunction, unspecified: Secondary | ICD-10-CM

## 2019-09-15 NOTE — Telephone Encounter (Signed)
Is this okay to refill? 

## 2019-09-28 ENCOUNTER — Other Ambulatory Visit: Payer: Medicare HMO

## 2019-10-16 ENCOUNTER — Other Ambulatory Visit: Payer: Self-pay | Admitting: Family Medicine

## 2019-10-16 DIAGNOSIS — M109 Gout, unspecified: Secondary | ICD-10-CM

## 2019-10-26 ENCOUNTER — Other Ambulatory Visit: Payer: Self-pay | Admitting: Family Medicine

## 2019-10-26 DIAGNOSIS — M109 Gout, unspecified: Secondary | ICD-10-CM

## 2019-10-26 NOTE — Telephone Encounter (Signed)
Has an appt end of september

## 2019-10-27 IMAGING — CT CT CHEST W/O CM
2 of 4 series · 15 of 36 positions shown, 18 images · non-contrast
Comparison: No priors.

CLINICAL DATA: 72-year-old male with history of left-sided
posterior rib pain for the past 2 days. History of prostate cancer.
Abnormal chest x-ray 03/13/2018.

EXAM:
CT CHEST WITHOUT CONTRAST
TECHNIQUE: Multidetector CT imaging of the chest was performed following the
standard protocol without IV contrast.

[Series 2: thorax · axial · 0.82mm/px · z∈[-336,-24]mm · 12 of 182 slices shown, 15 images]
[im 13/182  mediastinal]
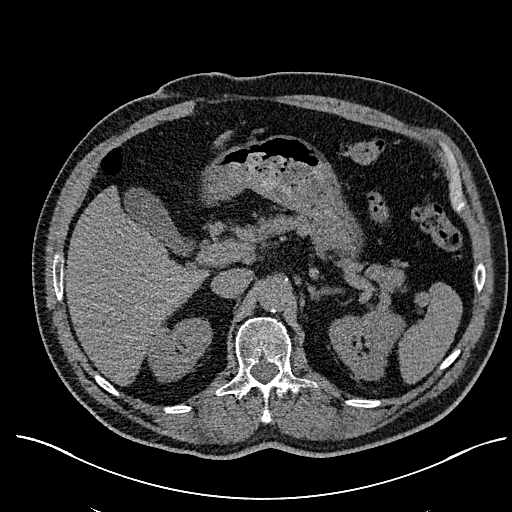
[im 13/182  lung]
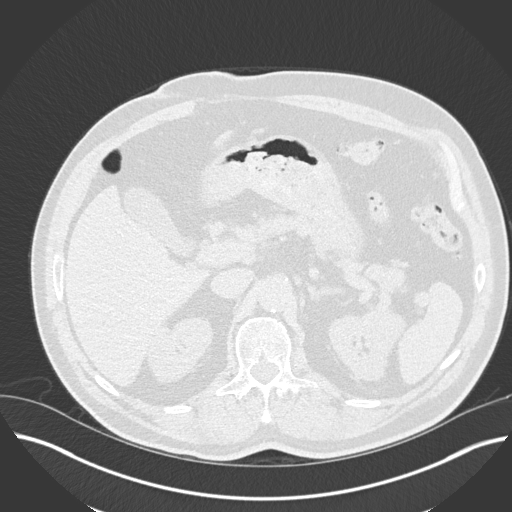
[im 26/182  lung]
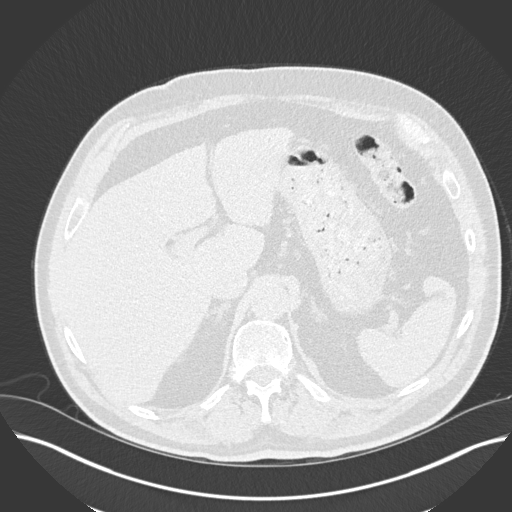
[im 39/182  lung]
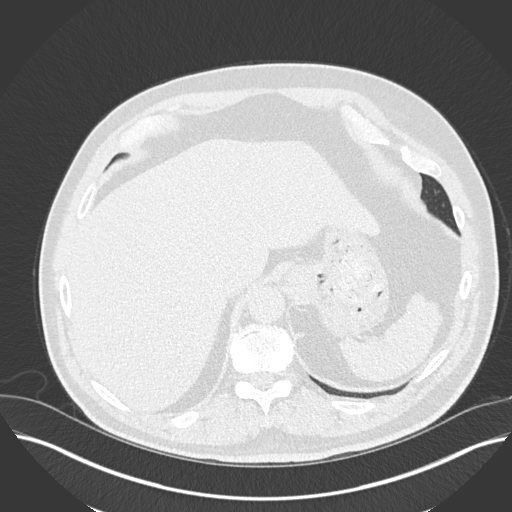
[im 52/182  lung]
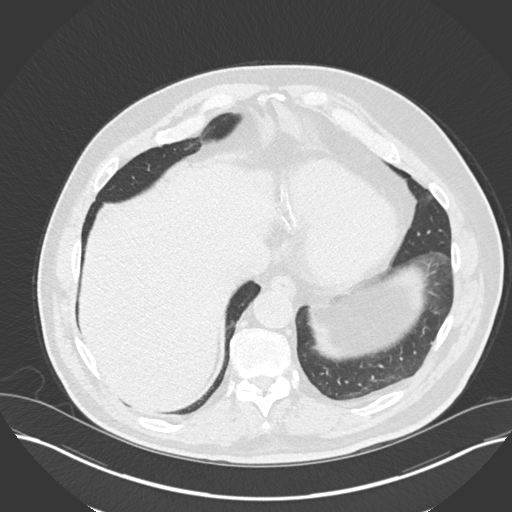
[im 65/182  mediastinal]
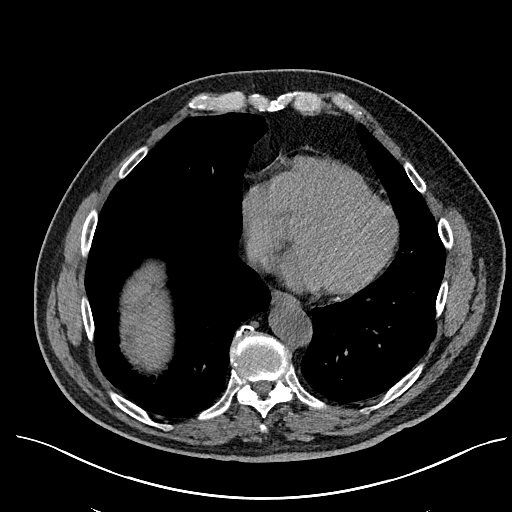
[im 65/182  lung]
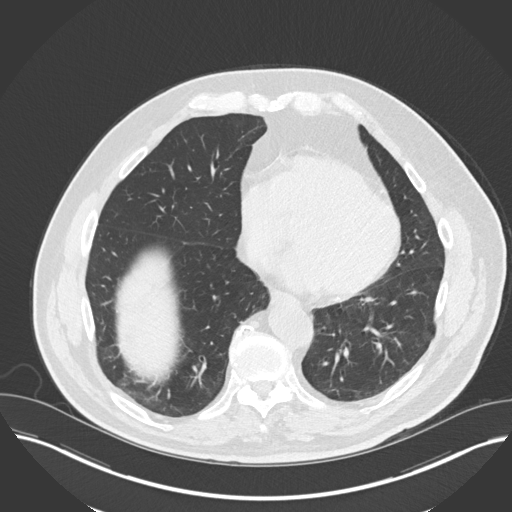
[im 78/182  lung]
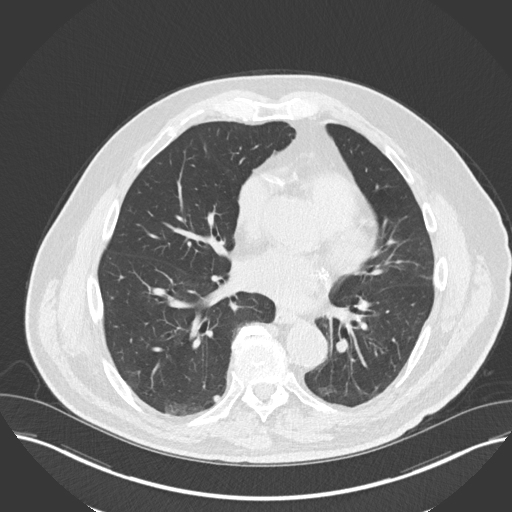
[im 104/182  lung]
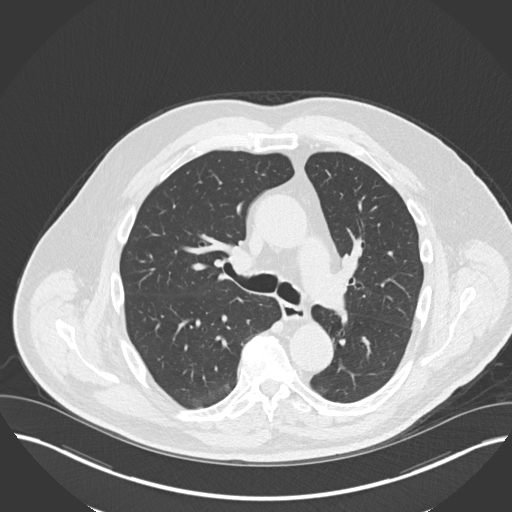
[im 117/182  lung]
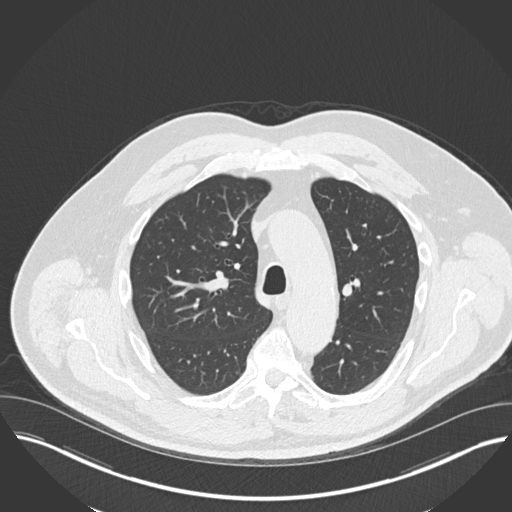
[im 130/182  mediastinal]
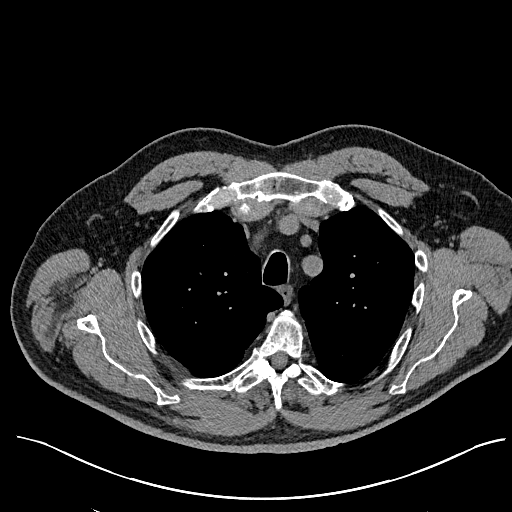
[im 130/182  lung]
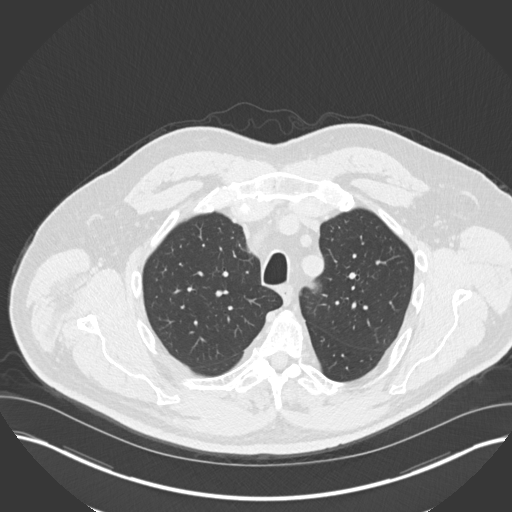
[im 143/182  lung]
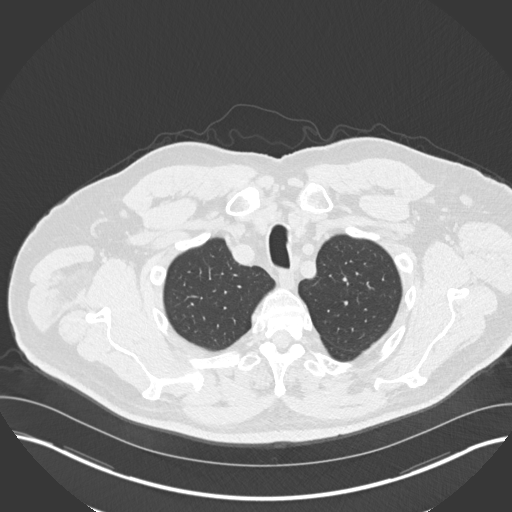
[im 156/182  lung]
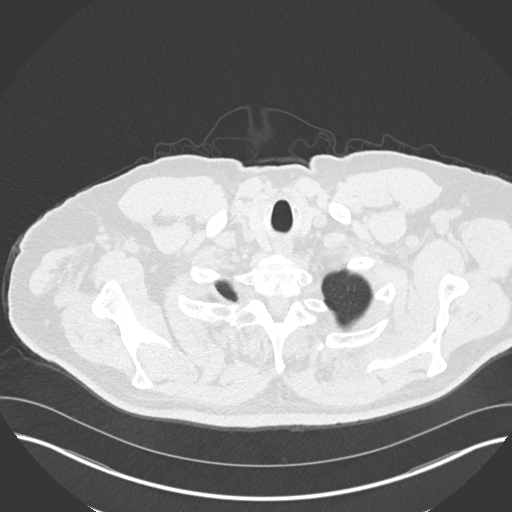
[im 169/182  lung]
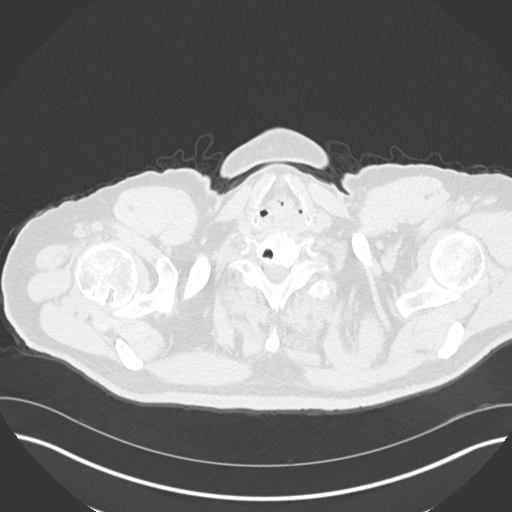

[Series 6: coronal · coronal · 0.72mm/px · 3 of 174 slices shown]
[im 35/174  lung]
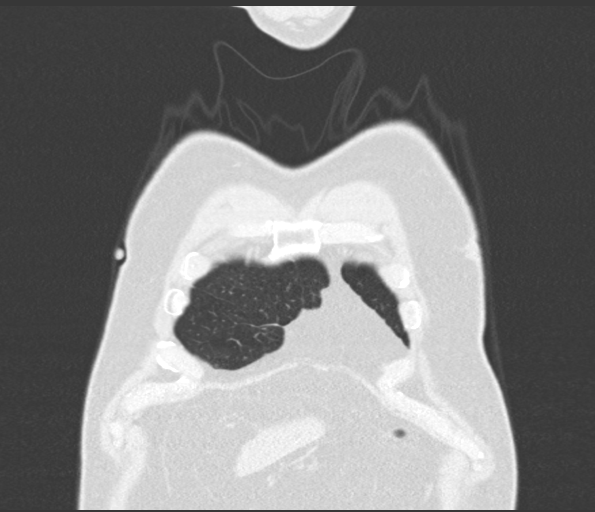
[im 70/174  lung]
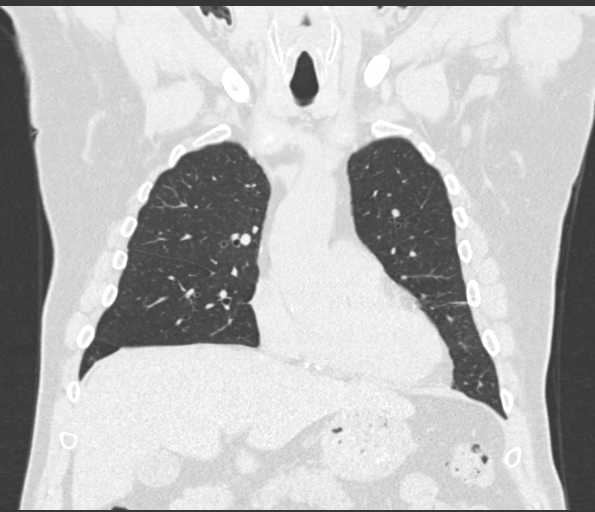
[im 104/174  lung]
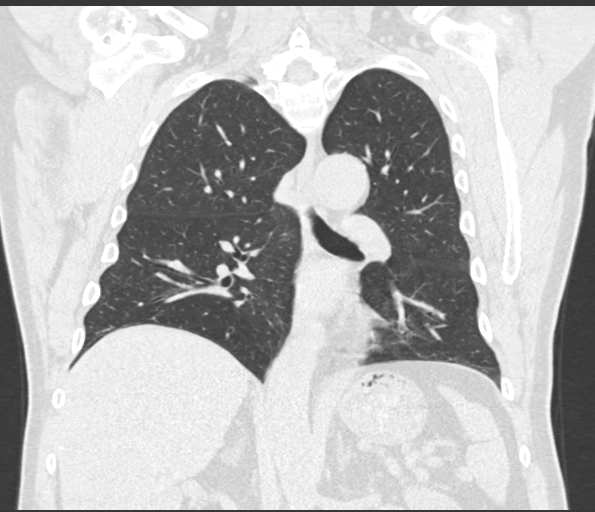

[15 of 36 positions shown; findings below may reference images not displayed]

FINDINGS: Cardiovascular: Heart size is normal. There is no significant
pericardial fluid, thickening or pericardial calcification. There is
aortic atherosclerosis, as well as atherosclerosis of the great
vessels of the mediastinum and the coronary arteries, including
calcified atherosclerotic plaque in the left main, left anterior
descending, left circumflex and right coronary arteries.

Mediastinum/Nodes: No pathologically enlarged mediastinal or hilar
lymph nodes. Please note that accurate exclusion of hilar adenopathy
is limited on noncontrast CT scans. Esophagus is unremarkable in
appearance. No axillary lymphadenopathy.

Lungs/Pleura: Scattered areas of mild linear scarring are noted
throughout the lung bases bilaterally. No acute consolidative
airspace disease. No pleural effusions. Two small pulmonary nodules
measuring 5 mm in the right lower lobe on axial image 127 of series
5 and axial image 105 of series 5, nonspecific, but favored to be
benign. No larger more suspicious appearing pulmonary nodules or
masses are noted.

Upper Abdomen: Diffuse low attenuation throughout the visualized
hepatic parenchyma, indicative of hepatic steatosis. Aortic
atherosclerosis.

Musculoskeletal: There are no acute displaced fractures or
aggressive appearing lytic or blastic lesions noted in the
visualized portions of the skeleton.
IMPRESSION: 1. No acute findings in the thorax to account for the patient's
symptoms.
2. Small pulmonary nodules measuring 5 mm in the right lower lobe,
nonspecific, but statistically likely benign. No follow-up needed if
patient is low-risk (and has no known or suspected primary
neoplasm). Non-contrast chest CT can be considered in 12 months if
patient is high-risk. This recommendation follows the consensus
statement: Guidelines for Management of Incidental Pulmonary Nodules
Detected on CT Images: From the [HOSPITAL] 2005; Radiology
2005; [DATE].
3. Aortic atherosclerosis, in addition to left main and 3 vessel
coronary artery disease. Assessment for potential risk factor
modification, dietary therapy or pharmacologic therapy may be
warranted, if clinically indicated.
4. Hepatic steatosis.

Aortic Atherosclerosis (C67IE-FWP.P).

## 2019-11-15 ENCOUNTER — Other Ambulatory Visit: Payer: Self-pay

## 2019-11-15 ENCOUNTER — Ambulatory Visit
Admission: RE | Admit: 2019-11-15 | Discharge: 2019-11-15 | Disposition: A | Discharge status: Home / Self Care | Payer: Medicare HMO | Source: Ambulatory Visit | Attending: Urology | Admitting: Urology

## 2019-11-15 DIAGNOSIS — C61 Malignant neoplasm of prostate: Secondary | ICD-10-CM

## 2019-11-15 MED ORDER — GADOBENATE DIMEGLUMINE 529 MG/ML IV SOLN
20.0000 mL | Freq: Once | INTRAVENOUS | Status: AC | PRN
  Administered 2019-11-15: 20 mL via INTRAVENOUS

## 2019-11-17 NOTE — Progress Notes (Signed)
Chief Complaint  Patient presents with  . Hypertension    fasting med check. Having issues with right achilles tendon, that is why he went to [facility] for 2nd opinion today.    Seen last month in UC in [place] for R achilles tendonitis and L ear fullness. He had cerumen removed and felt better. He was treated with a prednisone taper for his heel but didn't get much relief.  He saw ortho here yesterday Rip Harbour), who recommended Voltaren gel, given home exercises and rx for PT for [place].  He declined a trial of boot. Pain in the R heel comes and goes, limits his walking some.  1/10 pain. Yoga doesn't aggravate it.  He recalls getting benefit from iontophoresis treatment in the past.  Atherosclerosis or aorta and coronary arteries noted on CT in January 2020. He is taking ASA, Crestor 29m (along with CoQ10).  He underwent stress test with Dr. SMarlou Porchin 06/2018 which was low risk.  He denies any chest pain, palpitations, DOE. Last lipids were done 02/2019: TC 137, LDL 66, HDL 54. He is fasting for labs today.  Hypertension:Denies headaches, dizziness, edema, chest pain, palpitations, shortness of breath.He is compliant with lisinopril HCTZ and denies side effects. BP's are running 120-130/80 at home. He is happy with BPs/regimen and prefers not to change medications to lower uric acid (discussed in the past as well, aware that HCTZ can increase levels and his allopurinol dose may be higher due to this).  Gout: Dose of allopurinol was increased from 300 to 402mafter having flares with long drives.  Uric acid level went up to 6.5 in 11/2018, dose was further increased to 50054mtaking 300/200); f/u was 5.7 in 02/2019 and patient subsequently lowered the dose back to 400m58mily, and had been doing well without flares.  Currently he is taking 400mg76mly (300 + 100 tabs), and is requesting some clarification as the pharmacy has had issues with filling both prescriptions. He continues  to have lots of long drives. Wife is vegetarian/pescatarian (and lamb), but when he is away from home he eats a lot of steak. This week he has been away from home, eating more steak than typical.  Usually eats fish 4x/week.  Some knee pain flares periodically, no problems since injection in January.  Uses tylenol arthritis prn (1/week).  IFG: fasting glucose was 104 and A1c was 5.6 in 02/2019. A1c was 6.1 in 02/2018. He limits her carbs. Some weight gain since being on the road/traveling, will lose 5# with regular routine one back home.  Prostate cancer: has been under surveillance by Dr. DahlsDiona Fantilast had biopsy 01/2018, 3 of the 12 areas biopsied showed cancer, the worst was 3+4=7, and he other 2 were 3+3=6. Last PSA was 5.1 in 02/2019 (down from 7.6 in 05/2018). He had labs drawn earlier this week, will get results when he goes for his biopsy tomorrow.  He also had MRI, to discuss results tomorrow with urologist.  He is interested in hearing them today. MRI done 11/15/2019: IMPRESSION: 1.2 cm nodule in the left lateral apex, with evidence of extracapsular extension and left neurovascular bundle involvement. PI-RADS 5: Very high (clinically significant cancer is highly likely to be present). 1.7 cm nodule in the mid gland anterior fibromuscular stroma bilaterally, highly suspicious for high-grade carcinoma. Mild extracapsular extension is suspected. PI-RADS 5: Very high (clinically significant cancer is highly likely to be present). No evidence of pelvic lymphadenopathy or bone metastases.  98 yo58other  doing well in nursing home, dementia is significant.  His 72 yo brother with Downs syndrome is in a nearby facility, and he takes them to visit each other.  He is thankful that his mother cannot remember seeing the significant decline in his brother--rather suddenly he got worse, no longer eating, bathing/caring for self, just intermittently, needing a lot more care. Sometimes chooses not to  walk, so has a wheelchair.  Needing full time nursing care now.  This is very upsetting to Audra.  Erectile dysfunction: Controlled with sildenafil.  PMH, PSH, SH reviewed  Immunization History  Administered Date(s) Administered  . Hepatitis A 01/27/2006  . Influenza Split 01/10/2009, 01/03/2011, 01/06/2012  . Influenza, High Dose Seasonal PF 02/11/2013, 01/31/2014, 02/02/2015, 02/05/2016, 11/23/2016, 11/08/2017  . Influenza,inj,Quad PF,6+ Mos 12/05/2018  . Influenza-Unspecified 12/05/2018  . PFIZER SARS-COV-2 Vaccination 04/07/2019, 04/27/2019  . Pneumococcal Conjugate-13 02/11/2013  . Pneumococcal Polysaccharide-23 01/31/2014  . Tdap 02/22/2009, 06/09/2019  . Zoster 01/25/2006  . Zoster Recombinat (Shingrix) 06/25/2016, 11/23/2016   Outpatient Encounter Medications as of 11/18/2019  Medication Sig  . allopurinol (ZYLOPRIM) 100 MG tablet TAKE 1 TABLET BY MOUTH EVERY DAY  . allopurinol (ZYLOPRIM) 300 MG tablet TAKE 1 TABLET BY MOUTH EVERY DAY  . aspirin EC 81 MG tablet Take 1 tablet (81 mg total) by mouth daily.  . B Complex-C (SUPER B COMPLEX PO) Take 1 tablet by mouth daily.  . Cholecalciferol (VITAMIN D) 2000 UNITS tablet Take 2,000 Units by mouth daily.  Marland Kitchen COENZYME Q10 PO Take by mouth daily.  Marland Kitchen lisinopril-hydrochlorothiazide (ZESTORETIC) 20-25 MG tablet TAKE 1 TABLET BY MOUTH EVERY DAY  . rosuvastatin (CRESTOR) 10 MG tablet TAKE 1 TABLET BY MOUTH EVERY DAY  . sildenafil (REVATIO) 20 MG tablet TAKE 2 TO 5 TABLETS BY MOUTH DAILY AS NEEDED  . tamsulosin (FLOMAX) 0.4 MG CAPS capsule Take 0.4 mg by mouth daily.  Marland Kitchen acetaminophen (TYLENOL) 500 MG tablet Take 1,000 mg by mouth every 6 (six) hours as needed. (Patient not taking: Reported on 11/18/2019)  . [DISCONTINUED] ondansetron (ZOFRAN ODT) 4 MG disintegrating tablet Take 1-2 tablets daily as needed. (Patient not taking: Reported on 02/08/2019)   No facility-administered encounter medications on file as of 11/18/2019.   No Known  Allergies   ROS: no fever, chills, URI symptoms, headaches, dizziness, chest pain, shortness of breath, nausea, vomiting, heartburn, bowel changes. ED, controlled with medication. Moods are overall good. R heel pain per HPI.  Ear discomfort resolved.     PHYSICAL EXAM:  BP 110/70   Pulse 72   Ht '5\' 11"'  (1.803 m)   Wt 231 lb 6.4 oz (105 kg)   BMI 32.27 kg/m   Wt Readings from Last 3 Encounters:  11/18/19 231 lb 6.4 oz (105 kg)  06/09/19 230 lb 9.6 oz (104.6 kg)  02/08/19 209 lb (94.8 kg)    Well appearing, pleasant male in no distress HEENT: conjunctiva and sclera clear, EOMI. Wearing mask Neck: no lymphadenopathy, thyromegaly or mass Heart: regular rate and rhythm Lungs: clear bilaterally Abdomen: soft, nontender, no mass Back: no spinal or CVA tenderness Extremities: no edema, normal pulses.   Skin: normal turgor, no visible rash Psych: normal mood, affect, hygiene and grooming Neuro: alert and oriented, normal gait  Lab Results  Component Value Date   HGBA1C 5.7 (A) 11/18/2019    ASSESSMENT/PLAN:  HD flu shot Offer pfizer booster (>6 months from last) A1c c-met, lipids, CBC, uric acid  Refill lisinopril HCT and Crestor   REFILL CRESTOR  WHEN LABS BACK

## 2019-11-18 ENCOUNTER — Ambulatory Visit (INDEPENDENT_AMBULATORY_CARE_PROVIDER_SITE_OTHER): Payer: Medicare HMO | Admitting: Family Medicine

## 2019-11-18 ENCOUNTER — Encounter: Payer: Self-pay | Admitting: Family Medicine

## 2019-11-18 VITALS — BP 110/70 | HR 72 | Ht 71.0 in | Wt 231.4 lb

## 2019-11-18 DIAGNOSIS — Z23 Encounter for immunization: Secondary | ICD-10-CM | POA: Diagnosis not present

## 2019-11-18 DIAGNOSIS — R7301 Impaired fasting glucose: Secondary | ICD-10-CM

## 2019-11-18 DIAGNOSIS — I25119 Atherosclerotic heart disease of native coronary artery with unspecified angina pectoris: Secondary | ICD-10-CM

## 2019-11-18 DIAGNOSIS — I2510 Atherosclerotic heart disease of native coronary artery without angina pectoris: Secondary | ICD-10-CM

## 2019-11-18 DIAGNOSIS — I700 Atherosclerosis of aorta: Secondary | ICD-10-CM

## 2019-11-18 DIAGNOSIS — M109 Gout, unspecified: Secondary | ICD-10-CM

## 2019-11-18 DIAGNOSIS — Z5181 Encounter for therapeutic drug level monitoring: Secondary | ICD-10-CM

## 2019-11-18 DIAGNOSIS — C61 Malignant neoplasm of prostate: Secondary | ICD-10-CM

## 2019-11-18 DIAGNOSIS — E7800 Pure hypercholesterolemia, unspecified: Secondary | ICD-10-CM

## 2019-11-18 DIAGNOSIS — I10 Essential (primary) hypertension: Secondary | ICD-10-CM

## 2019-11-18 LAB — POCT GLYCOSYLATED HEMOGLOBIN (HGB A1C): Hemoglobin A1C: 5.7 % — AB (ref 4.0–5.6)

## 2019-11-18 MED ORDER — ALLOPURINOL 300 MG PO TABS: ORAL_TABLET | ORAL | 0 refills | Status: DC

## 2019-11-18 MED ORDER — LISINOPRIL-HYDROCHLOROTHIAZIDE 20-25 MG PO TABS: 1.0000 | ORAL_TABLET | Freq: Every day | ORAL | 1 refills | Status: DC

## 2019-11-18 MED ORDER — ALLOPURINOL 100 MG PO TABS: ORAL_TABLET | ORAL | 0 refills | Status: DC

## 2019-11-18 NOTE — Patient Instructions (Signed)
Continue your current medications. Refills will be sent on the Crestor tomorrow. Good luck with your biopsy and any treatments.

## 2019-11-19 LAB — LIPID PANEL
Chol/HDL Ratio: 3 ratio (ref 0.0–5.0)
Cholesterol, Total: 160 mg/dL (ref 100–199)
HDL: 53 mg/dL (ref >39)
LDL Chol Calc (NIH): 89 mg/dL (ref 0–99)
Triglycerides: 99 mg/dL (ref 0–149)
VLDL Cholesterol Cal: 18 mg/dL (ref 5–40)

## 2019-11-19 LAB — CBC WITH DIFFERENTIAL/PLATELET
Basophils Absolute: 0.1 10*3/uL (ref 0.0–0.2)
Basos: 1 %
EOS (ABSOLUTE): 0.2 10*3/uL (ref 0.0–0.4)
Eos: 3 %
Hematocrit: 45.7 % (ref 37.5–51.0)
Hemoglobin: 15.3 g/dL (ref 13.0–17.7)
Immature Grans (Abs): 0.1 10*3/uL (ref 0.0–0.1)
Immature Granulocytes: 1 %
Lymphocytes Absolute: 2 10*3/uL (ref 0.7–3.1)
Lymphs: 25 %
MCH: 31.2 pg (ref 26.6–33.0)
MCHC: 33.5 g/dL (ref 31.5–35.7)
MCV: 93 fL (ref 79–97)
Monocytes Absolute: 0.8 10*3/uL (ref 0.1–0.9)
Monocytes: 10 %
Neutrophils Absolute: 4.9 10*3/uL (ref 1.4–7.0)
Neutrophils: 60 %
Platelets: 261 10*3/uL (ref 150–450)
RBC: 4.9 x10E6/uL (ref 4.14–5.80)
RDW: 13.8 % (ref 11.6–15.4)
WBC: 8 10*3/uL (ref 3.4–10.8)

## 2019-11-19 LAB — COMPREHENSIVE METABOLIC PANEL
ALT: 20 IU/L (ref 0–44)
AST: 17 IU/L (ref 0–40)
Albumin/Globulin Ratio: 2.2 (ref 1.2–2.2)
Albumin: 4.7 g/dL (ref 3.7–4.7)
Alkaline Phosphatase: 79 IU/L (ref 44–121)
BUN/Creatinine Ratio: 32 — ABNORMAL HIGH (ref 10–24)
BUN: 25 mg/dL (ref 8–27)
Bilirubin Total: 0.5 mg/dL (ref 0.0–1.2)
CO2: 25 mmol/L (ref 20–29)
Calcium: 9.7 mg/dL (ref 8.6–10.2)
Chloride: 101 mmol/L (ref 96–106)
Creatinine, Ser: 0.78 mg/dL (ref 0.76–1.27)
GFR calc Af Amer: 104 mL/min/{1.73_m2} (ref >59)
GFR calc non Af Amer: 90 mL/min/{1.73_m2} (ref >59)
Globulin, Total: 2.1 g/dL (ref 1.5–4.5)
Glucose: 106 mg/dL — ABNORMAL HIGH (ref 65–99)
Potassium: 4.3 mmol/L (ref 3.5–5.2)
Sodium: 140 mmol/L (ref 134–144)
Total Protein: 6.8 g/dL (ref 6.0–8.5)

## 2019-11-19 LAB — URIC ACID: Uric Acid: 6.1 mg/dL (ref 3.8–8.4)

## 2019-11-19 MED ORDER — ROSUVASTATIN CALCIUM 10 MG PO TABS: 10.0000 mg | ORAL_TABLET | Freq: Every day | ORAL | 3 refills | Status: DC

## 2019-12-06 ENCOUNTER — Other Ambulatory Visit: Payer: Medicare HMO

## 2019-12-07 ENCOUNTER — Encounter: Payer: Self-pay | Admitting: Family Medicine

## 2019-12-09 ENCOUNTER — Encounter: Payer: Medicare HMO | Admitting: Family Medicine

## 2019-12-13 ENCOUNTER — Encounter: Payer: Self-pay | Admitting: Radiation Oncology

## 2019-12-13 NOTE — Progress Notes (Signed)
GU Location of Tumor / Histology: prostatic adenocarcinoma  If Prostate Cancer, Gleason Score is (3 + 4) and PSA is (6.46). Prostate volume: 64.52 g  Mark Caldwell diagnosed with prostate cancer in early 2013. Patient has been under active surveillance since diagnosis. Unfortunately, surveillance biopsy revealed disease progression and extracapsular extension.  Biopsies of prostate (if applicable) revealed:    Past/Anticipated interventions by urology, if any: prostate biopsy, active surveillance,prescribed tamsulosin, ED management, referral for consideration of radiotherapy  Past/Anticipated interventions by medical oncology, if any: no  Weight changes, if any: no  Bowel/Bladder complaints, if any: IPSS 2. SHIM 24 with sildenafil. Reports ED. Reports nocturia x2. Denies dysuria, hematuria, urinary leakage or incontinence. Denies any bowel complaints.   Nausea/Vomiting, if any: no  Pain issues, if any:  denies  SAFETY ISSUES:  Prior radiation? denies  Pacemaker/ICD? denies  Possible current pregnancy? no, male patient  Is the patient on methotrexate? denies  Current Complaints / other details:  74 year old male. Married with two children. Resides in [place].

## 2019-12-14 ENCOUNTER — Ambulatory Visit
Admission: RE | Admit: 2019-12-14 | Discharge: 2019-12-14 | Disposition: A | Discharge status: Home / Self Care | Payer: Medicare HMO | Source: Ambulatory Visit | Attending: Radiation Oncology | Admitting: Radiation Oncology

## 2019-12-14 ENCOUNTER — Other Ambulatory Visit: Payer: Self-pay

## 2019-12-14 ENCOUNTER — Encounter: Payer: Self-pay | Admitting: Radiation Oncology

## 2019-12-14 VITALS — Ht 73.0 in | Wt 227.0 lb

## 2019-12-14 DIAGNOSIS — C61 Malignant neoplasm of prostate: Secondary | ICD-10-CM

## 2019-12-14 NOTE — Progress Notes (Signed)
Radiation Oncology         [phone] ________________________________  Initial Outpatient Consultation - Conducted via Telephone due to current COVID-19 concerns for limiting patient exposure  Name: Mark Caldwell MRN: [id number]  Date: 12/14/2019  DOB: 1945/04/17  SJ:[id number], Eve, MD  Franchot Gallo, MD   REFERRING PHYSICIAN: Franchot Gallo, MD  DIAGNOSIS: 74 y.o. gentleman with Stage T1c adenocarcinoma of the prostate with Gleason score of 3+4, and PSA of 6.46.    ICD-10-CM   1. Prostate cancer ([place])  C61     HISTORY OF PRESENT ILLNESS: Mark Caldwell is a 74 y.o. male with a diagnosis of prostate cancer. He has been followed by Dr. Diona Fanti for an elevated PSA since 2013. He had an initial prostate biopsy in 03/2011 for a PSA of 4.7 that was benign. PCA-3 testing was performed and was positive so he had a repeat biopsy in 02/2012 and was found to have very low volume (5% in one core) Gleason 3+3 prostate adenocarcinoma with a PSA of 5.31.  After thorough discussion of treatment options, he elected to proceed with active surveillance and has continued with close monitoring under the care of Dr. Diona Fanti since that time. Surveillance biopsies in 09/2012 and 12/2014 showed stability and continued low volume Gleason 3+3 disease. Oncotype DX score was performed on the 12/2014 biopsy showing a score of 28, consistent with very low risk prostate cancer. The next surveillance biopsy on 02/02/2018 showed upstaging to Gleason 3+4 in 10% of the left apex with Gleason 3+3 in 5% of two cores with PSA stable at 5.9. At that time, his insurance would not cover a prostate MRI so he elected to proceed in active surveillance.  On 11/15/2019, he underwent surveillance prostate MRI showing a 1.2 cm PI-RADS 5 lesion in the left lateral apex, with evidence of extracapsular extension and left neurovascular bundle involvement. Additionally, there was a 1.7 cm PI-RADS 5 lesion in the mid gland anterior  fibromuscular stroma bilaterally with mild extracapsular extension suspected but no evidence of pelvic lymphadenopathy or bony metastases. A repeat PSA performed on 11/15/19 was 6.46. The patient proceeded to MRI fusion biopsy on 11/19/2019.  The prostate volume measured 64.52 cc.  Out of 18 core biopsies, 6 were positive.  The maximum Gleason score was 3+4, and this was seen in all three samples from the ROI in the mid gland as well as the right mid lateral core. Additionally, Gleason 3+3 was seen in 5% of the right apex and left apex lateral cores.  The patient reviewed the biopsy results with his urologist and he has kindly been referred today for discussion of potential radiation treatment options.   PREVIOUS RADIATION THERAPY: No  PAST MEDICAL HISTORY:  Past Medical History:  Diagnosis Date  . ED (erectile dysfunction)   . Gout   . Hemorrhoids   . Hypertension    elevated BP.  Marland Kitchen Left inguinal hernia    noted on CT 10/2013  . Partial tear of Achilles tendon summer 2005   left  . Premature ejaculation   . Prostate cancer ([place])    surveillance; Dr. Dahlstedt:12-30-14 prostate CA on 2 biopsies,Gleason for both 3+3=^   . Right rotator cuff tear       PAST SURGICAL HISTORY: Past Surgical History:  Procedure Laterality Date  . PROSTATE BIOPSY    . pyloric stenosis surgery  83 weeks old.  Marland Kitchen RETINAL DETACHMENT SURGERY  03/2014   "tacked it"  . ROTATOR CUFF REPAIR Right  Dr. Rhona Raider  . TONSILLECTOMY    . VASECTOMY      FAMILY HISTORY:  Family History  Problem Relation Age of Onset  . Asthma Mother   . Osteoporosis Mother   . Hypothyroidism Mother   . Leukemia Father   . Hyperlipidemia Father        increased chol  . Coronary artery disease Father 75       CABG  . Cancer Father        leukemia  . Down syndrome Brother   . Deep vein thrombosis Brother 20  . Gout Brother   . Dementia Brother   . Hodgkin's lymphoma Son   . Gout Son   . Cancer Maternal Grandfather         pancreatic cancer (and lung?/smoker)  . Diabetes Neg Hx   . Breast cancer Neg Hx   . Prostate cancer Neg Hx   . Colon cancer Neg Hx   . Pancreatic cancer Neg Hx     SOCIAL HISTORY:  Social History   Socioeconomic History  . Marital status: Married    Spouse name: Not on file  . Number of children: 2  . Years of education: Not on file  . Highest education level: Not on file  Occupational History  . Occupation: Personnel officer  Tobacco Use  . Smoking status: Former Smoker    Types: Pipe  . Smokeless tobacco: Never Used  . Tobacco comment: quit smoking pipe in the 80's  Vaping Use  . Vaping Use: Never used  Substance and Sexual Activity  . Alcohol use: Yes    Alcohol/week: 0.0 standard drinks    Comment: 5 drinks/week  . Drug use: No  . Sexual activity: Yes    Partners: Female  Other Topics Concern  . Not on file  Social History Narrative   Lives with wife.  Son died at 82.       Mother is in an assisted living facility in [place], [place].      Daughter in [place] (has 1 daughter, 1 son), Son in [place].  Brother in [place] (with Down's and Alzheimers), in a group home.      5 grandchildren (2 from daughter, 3 from son).      Living part-time in [place], [place] to help with his mother   (lived together during pandemic, she is now back in a facility).   Social Determinants of Health   Financial Resource Strain:   . Difficulty of Paying Living Expenses: Not on file  Food Insecurity:   . Worried About Charity fundraiser in the Last Year: Not on file  . Ran Out of Food in the Last Year: Not on file  Transportation Needs:   . Lack of Transportation (Medical): Not on file  . Lack of Transportation (Non-Medical): Not on file  Physical Activity:   . Days of Exercise per Week: Not on file  . Minutes of Exercise per Session: Not on file  Stress:   . Feeling of Stress : Not on file  Social Connections:   . Frequency of Communication with Friends and Family: Not on file  .  Frequency of Social Gatherings with Friends and Family: Not on file  . Attends Religious Services: Not on file  . Active Member of Clubs or Organizations: Not on file  . Attends Archivist Meetings: Not on file  . Marital Status: Not on file  Intimate Partner Violence:   . Fear of Current  or Ex-Partner: Not on file  . Emotionally Abused: Not on file  . Physically Abused: Not on file  . Sexually Abused: Not on file    ALLERGIES: Patient has no known allergies.  MEDICATIONS:  Current Outpatient Medications  Medication Sig Dispense Refill  . allopurinol (ZYLOPRIM) 100 MG tablet Take 1 tablet by mouth daily, along with 300mg  tablet (total dose 400mg ) 1 tablet 0  . aspirin EC 81 MG tablet Take 1 tablet (81 mg total) by mouth daily.    . B Complex-C (SUPER B COMPLEX PO) Take 1 tablet by mouth daily.    . Cholecalciferol (VITAMIN D) 2000 UNITS tablet Take 2,000 Units by mouth daily.    Marland Kitchen COENZYME Q10 PO Take by mouth daily.    Marland Kitchen lisinopril-hydrochlorothiazide (ZESTORETIC) 20-25 MG tablet Take 1 tablet by mouth daily. 90 tablet 1  . rosuvastatin (CRESTOR) 10 MG tablet Take 1 tablet (10 mg total) by mouth daily. 90 tablet 3  . sildenafil (REVATIO) 20 MG tablet TAKE 2 TO 5 TABLETS BY MOUTH DAILY AS NEEDED 90 tablet 1  . tamsulosin (FLOMAX) 0.4 MG CAPS capsule Take 0.4 mg by mouth daily.     No current facility-administered medications for this encounter.    REVIEW OF SYSTEMS:  On review of systems, the patient reports that he is doing well overall. He denies any chest pain, shortness of breath, cough, fevers, chills, night sweats, unintended weight changes. He denies any bowel disturbances, and denies abdominal pain, nausea or vomiting. He denies any new musculoskeletal or joint aches or pains. His IPSS was 2, indicating mild urinary symptoms. He endorses taking tamsulosin daily. His only complaint at present is nocturia x2. His SHIM was 24 with sildenafil, indicating he has  well-controlled erectile dysfunction. A complete review of systems is obtained and is otherwise negative.    PHYSICAL EXAM:  Wt Readings from Last 3 Encounters:  12/14/19 227 lb (103 kg)  11/18/19 231 lb 6.4 oz (105 kg)  06/09/19 230 lb 9.6 oz (104.6 kg)   Temp Readings from Last 3 Encounters:  06/09/19 98.2 F (36.8 C) (Other (Comment))  02/08/19 98.7 F (37.1 C) (Oral)  01/28/19 97.6 F (36.4 C) (Oral)   BP Readings from Last 3 Encounters:  11/18/19 110/70  06/09/19 110/70  02/08/19 131/74   Pulse Readings from Last 3 Encounters:  11/18/19 72  06/09/19 72  02/08/19 67   Pain Assessment Pain Score: 0-No pain/10  Physical exam not performed in light of telephone consult visit format.   KPS = 90  100 - Normal; no complaints; no evidence of disease. 90   - Able to carry on normal activity; minor signs or symptoms of disease. 80   - Normal activity with effort; some signs or symptoms of disease. 31   - Cares for self; unable to carry on normal activity or to do active work. 60   - Requires occasional assistance, but is able to care for most of his personal needs. 50   - Requires considerable assistance and frequent medical care. 30   - Disabled; requires special care and assistance. 34   - Severely disabled; hospital admission is indicated although death not imminent. 57   - Very sick; hospital admission necessary; active supportive treatment necessary. 10   - Moribund; fatal processes progressing rapidly. 0     - Dead  Karnofsky DA, Abelmann WH, Craver LS and Burchenal [facility] [phone]) The use of the nitrogen mustards in the palliative treatment of carcinoma: with  particular reference to bronchogenic carcinoma Cancer 1 634-56  LABORATORY DATA:  Lab Results  Component Value Date   WBC 8.0 11/18/2019   HGB 15.3 11/18/2019   HCT 45.7 11/18/2019   MCV 93 11/18/2019   PLT 261 11/18/2019   Lab Results  Component Value Date   NA 140 11/18/2019   K 4.3 11/18/2019   CL 101  11/18/2019   CO2 25 11/18/2019   Lab Results  Component Value Date   ALT 20 11/18/2019   AST 17 11/18/2019   ALKPHOS 79 11/18/2019   BILITOT 0.5 11/18/2019     RADIOGRAPHY: MR PROSTATE W WO CONTRAST  Result Date: 11/15/2019 CLINICAL DATA:  Prostate carcinoma, Gleason score 3+4=7. Active surveillance. EXAM: MR PROSTATE WITHOUT AND WITH CONTRAST TECHNIQUE: Multiplanar multisequence MRI images were obtained of the pelvis centered about the prostate. Pre and post contrast images were obtained. CONTRAST:  21mL MULTIHANCE GADOBENATE DIMEGLUMINE 529 MG/ML IV SOLN COMPARISON:  12/23/2014 FINDINGS: Prostate: -- Peripheral Zone: A poorly defined T2 hypointense nodule is seen in the left lateral apex, which measures 1.2 x 1.1 cm on image 18/8. This nodule shows moderate ADC hypointensity, no DWI hyperintensity, and mild early contrast enhancement. Extracapsular extension is seen at this site. An ill-defined T2 hypointense nodule is also seen in the mid gland anterior fibromuscular stroma bilaterally, measuring 1.7 x 1.1 cm on image 14/8. This shows marked ADC hypointensity, moderate DWI hyperintensity, and early focal contrast enhancement. -- Transition/Central Zone: Circumscribed BPH nodules are noted, but no suspicious nodules with obscured or non-circumscribed margins seen. -- Measurements/Volume:  5.4 x 4.3 x 5.5 cm (volume = 67 cm^3) Transcapsular spread: Extracapsular extension is seen at site of peripheral zone nodule in the left lateral apex. Focal capsular bulge is seen at site of nodule in the anterior fibromuscular, suspicious for extracapsular extension. Seminal vesicle involvement:  Absent Neurovascular bundle involvement: Left neurovascular bundle involvement is seen by nodule in the left apical peripheral zone. Pelvic adenopathy: None visualized Bone metastasis: None visualized Other:  None IMPRESSION: 1.2 cm nodule in the left lateral apex, with evidence of extracapsular extension and left  neurovascular bundle involvement. PI-RADS 5: Very high (clinically significant cancer is highly likely to be present). 1.7 cm nodule in the mid gland anterior fibromuscular stroma bilaterally, highly suspicious for high-grade carcinoma. Mild extracapsular extension is suspected. PI-RADS 5: Very high (clinically significant cancer is highly likely to be present). No evidence of pelvic lymphadenopathy or bone metastases. (I have processed this exam in the DynaCAD application for MR/US fusion-guided biopsy if performed.) Electronically Signed   By: Marlaine Hind M.D.   On: 11/15/2019 10:03      IMPRESSION/PLAN: This visit was conducted via Telephone to spare the patient unnecessary potential exposure in the healthcare setting during the current COVID-19 pandemic. 1. 74 y.o. gentleman with Stage T1c adenocarcinoma of the prostate with Gleason Score of 3+4, and PSA of 6.46. We discussed the patient's workup and outlined the nature of prostate cancer in this setting. The patient's T stage, Gleason's score, and PSA put him into the favorable intermediate risk group. Accordingly, he is eligible for a variety of potential treatment options including brachytherapy, 5.5 weeks of external radiation, or prostatectomy. We discussed the available radiation techniques, and focused on the details and logistics of delivery. The patient may not be an ideal candidate for brachytherapy with a prostate volume of 65 cc. We discussed that based on his prostate volume, we would recommend beginning treatment with a 5 alpha  reductase inhibitor for approximately 6 weeks prior to brachytherapy if this is his treatment preference. We discussed and outlined the risks, benefits, short and long-term effects associated with radiotherapy and compared and contrasted these with prostatectomy. We discussed the role of SpaceOAR in reducing the rectal toxicity associated with radiotherapy. He appears to have a good understanding of his disease and our  treatment recommendations which are of curative intent.  He was encouraged to ask questions that were answered to his stated satisfaction.  At the end of the conversation, the patient remains undecided regarding his final treatment preference but appears to be leaning towards proceeding with brachytherapy and use of SpaceOAR gel to reduce rectal toxicity from radiotherapy.  He plans to reach a decision in the next week and has our contact information to inform us of his decision and/or ask any further questions.  Once he has reached a decision we will share our discussion with Dr. Diona Fanti and move forward with scheduling his [facility] planning appointment in the near future.  If he indeed elects to proceed with brachytherapy, he will be contacted by Romie Jumper in our office who will be working closely with him to coordinate OR scheduling and pre and post procedure appointments.  We will contact the pharmaceutical rep to ensure that [place] is available at the time of procedure.  We enjoyed meeting him today and look forward to continuing to participate in his care.   Given current concerns for patient exposure during the COVID-19 pandemic, this encounter was conducted via telephone. The patient was notified in advance and was offered a MyChart meeting to allow for face to face communication but unfortunately reported that he did not have the appropriate resources/technology to support such a visit and instead preferred to proceed with telephone consult. The patient has given verbal consent for this type of encounter. The time spent during this encounter was 75 minutes. The attendants for this meeting include Tyler Pita MD, Ashlyn Bruning PA-C, [place], and patient Mark Caldwell and wife Stanton Kidney. During the encounter, Tyler Pita MD, Ashlyn Bruning PA-C, and scribe, Wilburn Mylar were located at [facility].  Patient Mark Caldwell  and wife Stanton Kidney were located at home.    Nicholos Johns, PA-C    Tyler Pita, MD  [facility] Direct Dial: [phone]  Fax: [phone] [email or web address]  Skype  LinkedIn  This document serves as a record of services personally performed by Tyler Pita, MD and Freeman Caldron, PA-C. It was created on their behalf by Wilburn Mylar, a trained medical scribe. The creation of this record is based on the scribe's personal observations and the provider's statements to them. This document has been checked and approved by the attending provider.

## 2019-12-21 ENCOUNTER — Telehealth: Payer: Self-pay | Admitting: Radiation Oncology

## 2019-12-21 NOTE — Telephone Encounter (Signed)
Received voicemail message requesting all correspondence be via cell as he is "never home." Updated Epic and ARIA record to reflect his request.

## 2019-12-23 ENCOUNTER — Encounter: Payer: Self-pay | Admitting: Medical Oncology

## 2019-12-23 ENCOUNTER — Telehealth: Payer: Self-pay | Admitting: *Deleted

## 2019-12-23 NOTE — Telephone Encounter (Signed)
CALLED PATIENT TO UPDATE, SPOKE WITH PATIENT 

## 2019-12-23 NOTE — Progress Notes (Signed)
Spoke with patient to introduce myself as the prostate nurse navigator and discuss my role. I was unable to meet him when he consulted with Dr. Tammi Klippel, due to consult being held by telephone. He has decided on brachytherapy as treatment. He is currently in [place] with his brother who is in Hospice. He would like to get the date for surgery as soon as possible because he has other obligations pending this surgery. He asked that we call his cell with information. I will follow up with Enid Derry and she will reach out to him. He voiced understanding. Due to no power or phone service, if we do not reach him, he asked that we text him.

## 2019-12-28 ENCOUNTER — Telehealth: Payer: Self-pay | Admitting: *Deleted

## 2019-12-28 NOTE — Telephone Encounter (Signed)
Called patient to update, spoke with patient. 

## 2020-01-04 ENCOUNTER — Telehealth: Payer: Self-pay | Admitting: *Deleted

## 2020-01-04 NOTE — Telephone Encounter (Signed)
CALLED PATIENT TO INFORM OF PRE-SEED APPTS. AND IMPLANT DATE, SPOKE WITH PATIENT 

## 2020-01-14 ENCOUNTER — Other Ambulatory Visit: Payer: Self-pay | Admitting: Family Medicine

## 2020-01-14 DIAGNOSIS — M109 Gout, unspecified: Secondary | ICD-10-CM

## 2020-01-14 NOTE — Telephone Encounter (Signed)
Previous one was only sent in as 1 tablet and think that was done as a mistake

## 2020-01-21 ENCOUNTER — Other Ambulatory Visit: Payer: Self-pay | Admitting: Family Medicine

## 2020-01-21 DIAGNOSIS — M109 Gout, unspecified: Secondary | ICD-10-CM

## 2020-02-09 ENCOUNTER — Telehealth: Payer: Self-pay | Admitting: *Deleted

## 2020-02-09 NOTE — Telephone Encounter (Signed)
CALLED PATIENT TO REMIND OF PRE-SEED APPTS. FOR 02-10-20, LVM FOR A RETURN CALL

## 2020-02-10 ENCOUNTER — Encounter (HOSPITAL_COMMUNITY)
Admission: RE | Admit: 2020-02-10 | Discharge: 2020-02-10 | Disposition: A | Discharge status: Home / Self Care | Payer: Medicare HMO | Source: Ambulatory Visit | Attending: Urology | Admitting: Urology

## 2020-02-10 ENCOUNTER — Ambulatory Visit (HOSPITAL_COMMUNITY)
Admission: RE | Admit: 2020-02-10 | Discharge: 2020-02-10 | Disposition: A | Discharge status: Home / Self Care | Payer: Medicare HMO | Source: Ambulatory Visit | Attending: Urology | Admitting: Urology

## 2020-02-10 ENCOUNTER — Encounter: Payer: Self-pay | Admitting: Medical Oncology

## 2020-02-10 ENCOUNTER — Encounter: Payer: Self-pay | Admitting: Family Medicine

## 2020-02-10 ENCOUNTER — Ambulatory Visit
Admission: RE | Admit: 2020-02-10 | Discharge: 2020-02-10 | Disposition: A | Discharge status: Home / Self Care | Payer: Medicare HMO | Source: Ambulatory Visit | Attending: Urology | Admitting: Urology

## 2020-02-10 ENCOUNTER — Other Ambulatory Visit: Payer: Self-pay

## 2020-02-10 ENCOUNTER — Ambulatory Visit (INDEPENDENT_AMBULATORY_CARE_PROVIDER_SITE_OTHER): Payer: Medicare HMO | Admitting: Family Medicine

## 2020-02-10 ENCOUNTER — Ambulatory Visit
Admission: RE | Admit: 2020-02-10 | Discharge: 2020-02-10 | Disposition: A | Discharge status: Home / Self Care | Payer: Medicare HMO | Source: Ambulatory Visit | Attending: Radiation Oncology | Admitting: Radiation Oncology

## 2020-02-10 VITALS — BP 120/70 | HR 80 | Ht 71.0 in | Wt 234.8 lb

## 2020-02-10 DIAGNOSIS — Z01818 Encounter for other preprocedural examination: Secondary | ICD-10-CM | POA: Insufficient documentation

## 2020-02-10 DIAGNOSIS — C61 Malignant neoplasm of prostate: Secondary | ICD-10-CM | POA: Insufficient documentation

## 2020-02-10 DIAGNOSIS — S61218A Laceration without foreign body of other finger without damage to nail, initial encounter: Secondary | ICD-10-CM | POA: Diagnosis not present

## 2020-02-10 NOTE — Progress Notes (Signed)
  Radiation Oncology         [phone] ________________________________  Name: Bryten Maher MRN: [id number]  Date: 02/10/2020  DOB: Jan 24, 1946  SIMULATION AND TREATMENT PLANNING NOTE PUBIC ARCH STUDY  GF:[id number], Tera Helper, MD  Franchot Gallo, MD  DIAGNOSIS: 74 y.o. gentleman with Stage T1c adenocarcinoma of the prostate with Gleason score of 3+4, and PSA of 6.46.  Oncology History  Prostate cancer ([place])  10/12/2012 Initial Diagnosis   Prostate cancer ([place])   11/19/2019 Cancer Staging   Staging form: Prostate, AJCC 8th Edition - Clinical stage from 11/19/2019: Stage IIB (cT1c, cN0, cM0, PSA: 6.5, Grade Group: 2) - Signed by Freeman Caldron, PA-C on 12/14/2019       ICD-10-CM   1. Prostate cancer ([place])  C61     COMPLEX SIMULATION:  The patient presented today for evaluation for possible prostate seed implant. He was brought to the radiation planning suite and placed supine on the CT couch. A 3-dimensional image study set was obtained in upload to the planning computer. There, on each axial slice, I contoured the prostate gland. Then, using three-dimensional radiation planning tools I reconstructed the prostate in view of the structures from the transperineal needle pathway to assess for possible pubic arch interference. In doing so, I did not appreciate any pubic arch interference. Also, the patient's prostate volume was estimated based on the drawn structure. The volume was 60 cc.  Given the pubic arch appearance and prostate volume, patient remains a good candidate to proceed with prostate seed implant. Today, he freely provided informed written consent to proceed.    PLAN: The patient will undergo prostate seed implant.   ________________________________  Sheral Apley. Tammi Klippel, M.D.

## 2020-02-10 NOTE — Patient Instructions (Signed)
Sutured Wound Care Sutures are stitches that can be used to close wounds. Taking care of your wound properly can help to prevent pain and infection. It can also help your wound to heal more quickly. Follow instructions from your health care provider about how to care for your sutured wound. Supplies needed:  Soap and water.  A clean bandage (dressing), if needed.  Antibiotic ointment.  A clean towel. How to care for your sutured wound   Keep the wound completely dry for the first 24 hours, or for as long as directed by your health care provider. After 24-48 hours, you may shower or bathe as directed by your health care provider. Do not soak or submerge the wound in water until the sutures have been removed.  After the first 24 hours, clean the wound once a day, or as often as directed by your health care provider, using the following steps: ? Wash the wound with soap and water. ? Rinse the wound with water to remove all soap. ? Pat the wound dry with a clean towel. Do not rub the wound.  After cleaning the wound, apply a thin layer of antibiotic ointment as directed by your health care provider. This will prevent infection and keep the dressing from sticking to the wound.  Follow instructions from your health care provider about how to change your dressing: ? Wash your hands with soap and water. If soap and water are not available, use hand sanitizer. ? Change your dressing at least once a day, or as often as told by your health care provider. If your dressing gets wet or dirty, change it. ? Leave sutures and other skin closures, such as adhesive tape or skin glue, in place. These skin closures may need to stay in place for 2 weeks or longer. If adhesive strip edges start to loosen and curl up, you may trim the loose edges. Do not remove adhesive strips completely unless your health care provider tells you to do that.  Check your wound every day for signs of infection. Watch  for: ? Redness, swelling, or pain. ? Fluid or blood. ? Warmth. ? Pus or a bad smell.  Have the sutures removed as directed by your health care provider. Follow these instructions at home: Medicines  Take or apply over-the-counter and prescription medicines only as told by your health care provider.  If you were prescribed an antibiotic medicine or ointment, take or apply it as told by your health care provider. Do not stop using the antibiotic even if your condition improves. General instructions  To help reduce scarring after your wound heals, cover your wound with clothing or apply sunscreen of at least 30 SPF whenever you are outside.  Do not scratch or pick at your wound.  Avoid stretching your wound.  Raise (elevate) the injured area above the level of your heart while you are sitting or lying down, if possible.  Drink enough fluids to keep your urine clear or pale yellow.  Keep all follow-up visits as told by your health care provider. This is important. Contact a health care provider if:  You received a tetanus shot and you have swelling, severe pain, redness, or bleeding at the injection site.  Your wound breaks open.  You have redness, swelling, or pain around your wound.  You have fluid or blood coming from your wound.  Your wound feels warm to the touch.  You have a fever.  You notice something coming out of your  wound, such as wood or glass.  You have pain that does not get better with medicine.  The skin near your wound changes color.  You need to change your dressing very frequently due to a lot of fluid, blood, or pus draining from the wound.  You develop a new rash.  You develop numbness around the wound. Get help right away if:  You develop severe swelling around your wound.  You have pus or a bad smell coming from your wound.  Your pain suddenly gets worse and is severe.  You develop painful lumps near your wound or anywhere on your  body.  You have a red streak going away from your wound.  The wound is on your hand or foot and: ? You cannot properly move a finger or toe. ? Your fingers or toes look pale or bluish. ? You have numbness that is spreading down your hand, foot, fingers, or toes. Summary  Sutures are stitches that can be used to close wounds.  Taking care of your wound properly can help to prevent pain and infection.  Keep the wound completely dry for the first 24 hours, or for as long as directed by your health care provider. After 24-48 hours, you may shower or bathe as directed by your health care provider. This information is not intended to replace advice given to you by your health care provider. Make sure you discuss any questions you have with your health care provider. Document Revised: 01/24/2017 Document Reviewed: 03/19/2016 Elsevier Patient Education  2020 [employer].

## 2020-02-10 NOTE — Progress Notes (Signed)
Chief Complaint  Patient presents with  . Laceration    Cut left pinky finger on jagged edge of metal shelf in garage about an hour ago.     While getting something heavy off a shelf in the garage, caught a sharp edge with his left pinkie.  He describes getting a V-shaped laceration, on either side of the nail.  The electrician at his house bandaged it.  He reports having a busy/rough time.  His brother died 66 weeks ago. Getting seeds prepped for prostate cancer--treatment next month.  He is heading up to [place] tomorrow for his mother's birthday.  Immunization History  Administered Date(s) Administered  . Fluad Quad(high Dose 65+) 11/18/2019  . Hepatitis A 01/27/2006  . Influenza Split 01/10/2009, 01/03/2011, 01/06/2012  . Influenza, High Dose Seasonal PF 02/11/2013, 01/31/2014, 02/02/2015, 02/05/2016, 11/23/2016, 11/08/2017, 11/18/2019  . Influenza,inj,Quad PF,6+ Mos 12/05/2018  . Influenza-Unspecified 12/05/2018  . PFIZER SARS-COV-2 Vaccination 04/07/2019, 04/28/2019, 11/18/2019  . Pneumococcal Conjugate-13 02/11/2013  . Pneumococcal Polysaccharide-23 01/31/2014  . Tdap 02/22/2009, 06/09/2019  . Zoster 01/25/2006  . Zoster Recombinat (Shingrix) 06/25/2016, 11/23/2016   PMH, PSH, SH reviewed  Outpatient Encounter Medications as of 02/10/2020  Medication Sig  . allopurinol (ZYLOPRIM) 100 MG tablet TAKE 1 TABLET BY MOUTH EVERY DAY  . allopurinol (ZYLOPRIM) 300 MG tablet TAKE 1 TABLET BY MOUTH EVERY DAY  . aspirin EC 81 MG tablet Take 1 tablet (81 mg total) by mouth daily.  . B Complex-C (SUPER B COMPLEX PO) Take 1 tablet by mouth daily.  . Cholecalciferol (VITAMIN D) 2000 UNITS tablet Take 2,000 Units by mouth daily.  Marland Kitchen COENZYME Q10 PO Take by mouth daily.  Marland Kitchen lisinopril-hydrochlorothiazide (ZESTORETIC) 20-25 MG tablet Take 1 tablet by mouth daily.  . rosuvastatin (CRESTOR) 10 MG tablet Take 1 tablet (10 mg total) by mouth daily.  . sildenafil (REVATIO) 20 MG tablet TAKE 2 TO 5  TABLETS BY MOUTH DAILY AS NEEDED  . tamsulosin (FLOMAX) 0.4 MG CAPS capsule Take 0.4 mg by mouth daily.   No facility-administered encounter medications on file as of 02/10/2020.   No Known Allergies  ROS: no fever, chills, URI symptoms or other problems.  PHYSICAL EXAM:  BP 120/70   Pulse 80   Ht 5\' 11"  (1.803 m)   Wt 234 lb 12.8 oz (106.5 kg)   BMI 32.75 kg/m   Well appearing male in no distress Finger is bandaged, and bandage is adhered to wound. Removed with soaking.  Bleeding restarted, and had ongoing bleeding from the ulnar edge, with smaller oozing from the rest of the wound. There is a v-shaped laceration with skin flap looking somewhat dusky with decreased sensation. He has normal sensation and brisk capillary refill to fingertip.  Wound was cleansed, anesthetized with lidocaine without epinephrine. 4-0 nylon interrupted sutures x 7 placed.  Bleeding was well controlled. Bacitracin and dressing was applied.  ASSESSMENT/PLAN  Laceration of skin of little finger, initial encounter - Plan: PR REPR,FACE,GENITAL,HAND,FT+5 CMCM/<  Discussed proper wound care. Advised that portion of the skin flap likely isn't viable, and how that will heal. Reviewed s/sx infection and to seek immediate care. He is going Games developer. Laceration overlies DIP, so recommended suture removal in 10 days.  He will send photos via MyChart if any concerns regarding the wound. He does have a doctor in [place] that he can see for suture removal.  Tetanus UTD.  I spent 40 minutes dedicated to the care of this patient, including pre-visit review of records,  face to face time, post-visit ordering of testing and documentation.

## 2020-02-14 ENCOUNTER — Other Ambulatory Visit: Payer: Self-pay | Admitting: Urology

## 2020-02-14 ENCOUNTER — Encounter: Payer: Self-pay | Admitting: Family Medicine

## 2020-03-06 ENCOUNTER — Telehealth: Payer: Self-pay | Admitting: *Deleted

## 2020-03-06 NOTE — Telephone Encounter (Signed)
RETURNED PATIENT'S PHONE CALL, SPOKE WITH PATIENT. ?

## 2020-03-09 ENCOUNTER — Telehealth: Payer: Self-pay | Admitting: *Deleted

## 2020-03-09 NOTE — Telephone Encounter (Signed)
CALLED PATIENT TO REMIND OF LAB AND COVID TESTING, SPOKE WITH PATIENT AND HE IS AWARE OF THESE APPTS. 

## 2020-03-13 ENCOUNTER — Other Ambulatory Visit: Payer: Self-pay

## 2020-03-13 ENCOUNTER — Other Ambulatory Visit (HOSPITAL_COMMUNITY): Payer: Medicare HMO

## 2020-03-13 ENCOUNTER — Encounter (HOSPITAL_COMMUNITY)
Admission: RE | Admit: 2020-03-13 | Discharge: 2020-03-13 | Disposition: A | Discharge status: Home / Self Care | Payer: Medicare HMO | Source: Ambulatory Visit | Attending: Urology | Admitting: Urology

## 2020-03-13 DIAGNOSIS — Z01812 Encounter for preprocedural laboratory examination: Secondary | ICD-10-CM | POA: Insufficient documentation

## 2020-03-13 LAB — COMPREHENSIVE METABOLIC PANEL
ALT: 24 U/L (ref 0–44)
AST: 18 U/L (ref 15–41)
Albumin: 4.5 g/dL (ref 3.5–5.0)
Alkaline Phosphatase: 71 U/L (ref 38–126)
Anion gap: 11 (ref 5–15)
BUN: 21 mg/dL (ref 8–23)
CO2: 23 mmol/L (ref 22–32)
Calcium: 9.9 mg/dL (ref 8.9–10.3)
Chloride: 105 mmol/L (ref 98–111)
Creatinine, Ser: 0.91 mg/dL (ref 0.61–1.24)
GFR, Estimated: >60 mL/min (ref >60)
Glucose, Bld: 117 mg/dL — ABNORMAL HIGH (ref 70–99)
Potassium: 4.1 mmol/L (ref 3.5–5.1)
Sodium: 139 mmol/L (ref 135–145)
Total Bilirubin: 0.7 mg/dL (ref 0.3–1.2)
Total Protein: 7.2 g/dL (ref 6.5–8.1)

## 2020-03-13 LAB — CBC
HCT: 46.4 % (ref 39.0–52.0)
Hemoglobin: 15.7 g/dL (ref 13.0–17.0)
MCH: 31 pg (ref 26.0–34.0)
MCHC: 33.8 g/dL (ref 30.0–36.0)
MCV: 91.5 fL (ref 80.0–100.0)
Platelets: 257 10*3/uL (ref 150–400)
RBC: 5.07 MIL/uL (ref 4.22–5.81)
RDW: 14 % (ref 11.5–15.5)
WBC: 7.8 10*3/uL (ref 4.0–10.5)
nRBC: 0 % (ref 0.0–0.2)

## 2020-03-13 LAB — PROTIME-INR
INR: 1.1 (ref 0.8–1.2)
Prothrombin Time: 13.6 seconds (ref 11.4–15.2)

## 2020-03-13 LAB — APTT: aPTT: 33 seconds (ref 24–36)

## 2020-03-14 ENCOUNTER — Other Ambulatory Visit (HOSPITAL_COMMUNITY)
Admission: RE | Admit: 2020-03-14 | Discharge: 2020-03-14 | Disposition: A | Discharge status: Home / Self Care | Payer: Medicare HMO | Source: Ambulatory Visit | Attending: Urology | Admitting: Urology

## 2020-03-14 ENCOUNTER — Encounter (HOSPITAL_BASED_OUTPATIENT_CLINIC_OR_DEPARTMENT_OTHER): Payer: Self-pay | Admitting: Urology

## 2020-03-14 ENCOUNTER — Other Ambulatory Visit: Payer: Self-pay

## 2020-03-14 DIAGNOSIS — Z20822 Contact with and (suspected) exposure to covid-19: Secondary | ICD-10-CM | POA: Diagnosis not present

## 2020-03-14 DIAGNOSIS — Z01818 Encounter for other preprocedural examination: Secondary | ICD-10-CM | POA: Diagnosis not present

## 2020-03-14 NOTE — Progress Notes (Addendum)
Spoke w/ via phone for pre-op interview---pt Lab needs dos----  None labs done 03-13-2020 cbc, cmet, pt, ptt epic, ekg 02-10-2020 epic, chest xray 02-10-2020 epic, chest ct 03-18-2018 epic             COVID test ------03-14-2020 1405 pm Arrive at -------930 am 03-16-2020 NPO after MN NO Solid Food.  Clear liquids from MN until---830 am then npo Medications to take morning of surgery -----rosuvastatin, allopurinol, tamsulosin Diabetic medication -----n/a Patient Special Instructions -----fleets enema am of surgery Pre-Op special Istructions -----none Patient verbalized understanding of instructions that were given at this phone interview. Patient denies shortness of breath, chest pain, fever, cough at this phone interview.  Patient 75 year old mother tetsted covid positive today 03-14-2020 patient reports he has not seen mother since 03-08-2020 and no current covid symptoms

## 2020-03-15 ENCOUNTER — Telehealth: Payer: Self-pay | Admitting: *Deleted

## 2020-03-15 LAB — SARS CORONAVIRUS 2 (TAT 6-24 HRS): SARS Coronavirus 2: NEGATIVE

## 2020-03-15 NOTE — H&P (Signed)
H&P  Chief Complaint:  Prostate cancer  History of Present Illness:  75 year old male presents at this time for brachytherapy for management of prostate cancer.   He was initially diagnosed on ultrasound and biopsy in January, 2014.  He has been followed with active surveillance since that time.  Repeat biopsy in September of 2021 revealed 3 cores positive, 1 with GS 3+4 pattern.  He presents at this time for brachytherapy and Space OAR application  Past Medical History:  Diagnosis Date  . ED (erectile dysfunction)   . Gout    no problem in years  . Hemorrhoids   . Hypertension    elevated BP.  Marland Kitchen Left inguinal hernia    noted on CT 10/2013  . Partial tear of Achilles tendon summer 2005   right healed   . Premature ejaculation   . Prostate cancer ([place])    surveillance; Dr. Virginie Josten:12-30-14 prostate CA on 2 biopsies,Gleason for both 3+3=^   . Right rotator cuff tear hx of  . Wears glasses    for reading    Past Surgical History:  Procedure Laterality Date  . PROSTATE BIOPSY  2021, 2018, 2016  . pyloric stenosis surgery  67 weeks old.  Marland Kitchen RETINAL DETACHMENT SURGERY  03/2014   "tacked it"  . ROTATOR CUFF REPAIR Right 2012   Dr. Rhona Raider  . TONSILLECTOMY  as child  . VASECTOMY  yrs ago    Home Medications:  Allergies as of 03/15/2020   No Known Allergies     Medication List    Notice   Cannot display discharge medications because the patient has not yet been admitted.     Allergies: No Known Allergies  Family History  Problem Relation Age of Onset  . Asthma Mother   . Osteoporosis Mother   . Hypothyroidism Mother   . Leukemia Father   . Hyperlipidemia Father        increased chol  . Coronary artery disease Father 48       CABG  . Cancer Father        leukemia  . Down syndrome Brother   . Deep vein thrombosis Brother 20  . Gout Brother   . Dementia Brother   . Hodgkin's lymphoma Son   . Gout Son   . Cancer Maternal Grandfather        pancreatic cancer  (and lung?/smoker)  . Diabetes Neg Hx   . Breast cancer Neg Hx   . Prostate cancer Neg Hx   . Colon cancer Neg Hx   . Pancreatic cancer Neg Hx     Social History:  reports that he has quit smoking. His smoking use included pipe. He has never used smokeless tobacco. He reports current alcohol use. He reports that he does not use drugs.  ROS: A complete review of systems was performed.  All systems are negative except for pertinent findings as noted.  Physical Exam:  Vital signs in last 24 hours: Ht 6\' 1"  (1.854 m)   Wt 105.2 kg   BMI 30.61 kg/m  Constitutional:  Alert and oriented, No acute distress Cardiovascular: Regular rate  Respiratory: Normal respiratory effort GI: Abdomen is soft, nontender, nondistended, no abdominal masses. No CVAT.  Genitourinary: Normal male phallus, testes are descended bilaterally and non-tender and without masses, scrotum is normal in appearance without lesions or masses, perineum is normal on inspection. Lymphatic: No lymphadenopathy Neurologic: Grossly intact, no focal deficits Psychiatric: Normal mood and affect  Laboratory Data:  Recent Labs  03/13/20 0957  WBC 7.8  HGB 15.7  HCT 46.4  PLT 257    Recent Labs    03/13/20 0957  NA 139  K 4.1  CL 105  GLUCOSE 117*  BUN 21  CALCIUM 9.9  CREATININE 0.91     Results for orders placed or performed during the hospital encounter of 03/14/20 (from the past 24 hour(s))  SARS CORONAVIRUS 2 (TAT 6-24 HRS) Nasopharyngeal Nasopharyngeal Swab     Status: None   Collection Time: 03/14/20  1:57 PM   Specimen: Nasopharyngeal Swab  Result Value Ref Range   SARS Coronavirus 2 NEGATIVE NEGATIVE   Recent Results (from the past 240 hour(s))  SARS CORONAVIRUS 2 (TAT 6-24 HRS) Nasopharyngeal Nasopharyngeal Swab     Status: None   Collection Time: 03/14/20  1:57 PM   Specimen: Nasopharyngeal Swab  Result Value Ref Range Status   SARS Coronavirus 2 NEGATIVE NEGATIVE Final    Comment:  (NOTE) SARS-CoV-2 target nucleic acids are NOT DETECTED.  The SARS-CoV-2 RNA is generally detectable in upper and lower respiratory specimens during the acute phase of infection. Negative results do not preclude SARS-CoV-2 infection, do not rule out co-infections with other pathogens, and should not be used as the sole basis for treatment or other patient management decisions. Negative results must be combined with clinical observations, patient history, and epidemiological information. The expected result is Negative.  Fact Sheet for Patients: SugarRoll.be  Fact Sheet for Healthcare Providers: https://www.woods-mathews.com/  This test is not yet approved or cleared by the [place] FDA and  has been authorized for detection and/or diagnosis of SARS-CoV-2 by FDA under an Emergency Use Authorization (EUA). This EUA will remain  in effect (meaning this test can be used) for the duration of the COVID-19 declaration under Se ction 564(b)(1) of the Act, 21 U.S.C. section 360bbb-3(b)(1), unless the authorization is terminated or revoked sooner.  Performed at [facility], [place] [address] [place], [place] [address]     Renal Function: Recent Labs    03/13/20 0957  CREATININE 0.91   Estimated Creatinine Clearance: 93.4 mL/min (by C-G formula based on SCr of 0.91 mg/dL).  Radiologic Imaging: No results found.  Impression/Assessment:   prostate cancer, favorable intermediate risk  Plan:   I 125 brachytherapy, placement of Space OAR

## 2020-03-15 NOTE — Telephone Encounter (Signed)
CALLED PATIENT TO REMIND OF PROCEDURE FOR 03-16-20, SPOKE WITH PATIENT AND HE IS AWARE OF THIS PROCEDURE

## 2020-03-16 ENCOUNTER — Encounter (HOSPITAL_BASED_OUTPATIENT_CLINIC_OR_DEPARTMENT_OTHER): Payer: Self-pay | Admitting: Urology

## 2020-03-16 ENCOUNTER — Encounter (HOSPITAL_BASED_OUTPATIENT_CLINIC_OR_DEPARTMENT_OTHER)
Admission: RE | Disposition: A | Discharge status: Home / Self Care | Payer: Self-pay | Source: Home / Self Care | Attending: Urology

## 2020-03-16 ENCOUNTER — Ambulatory Visit (HOSPITAL_BASED_OUTPATIENT_CLINIC_OR_DEPARTMENT_OTHER)
Admission: RE | Admit: 2020-03-16 | Discharge: 2020-03-16 | Disposition: A | Discharge status: Home / Self Care | Payer: Medicare HMO | Attending: Urology | Admitting: Urology

## 2020-03-16 ENCOUNTER — Ambulatory Visit (HOSPITAL_BASED_OUTPATIENT_CLINIC_OR_DEPARTMENT_OTHER): Payer: Medicare HMO | Admitting: Anesthesiology

## 2020-03-16 ENCOUNTER — Other Ambulatory Visit: Payer: Self-pay

## 2020-03-16 ENCOUNTER — Ambulatory Visit (HOSPITAL_COMMUNITY): Payer: Medicare HMO

## 2020-03-16 ENCOUNTER — Telehealth: Payer: Self-pay | Admitting: *Deleted

## 2020-03-16 DIAGNOSIS — K862 Cyst of pancreas: Secondary | ICD-10-CM | POA: Diagnosis not present

## 2020-03-16 DIAGNOSIS — Z810 Family history of intellectual disabilities: Secondary | ICD-10-CM | POA: Insufficient documentation

## 2020-03-16 DIAGNOSIS — Z800 Family history of malignant neoplasm of digestive organs: Secondary | ICD-10-CM | POA: Diagnosis not present

## 2020-03-16 DIAGNOSIS — Z8262 Family history of osteoporosis: Secondary | ICD-10-CM | POA: Insufficient documentation

## 2020-03-16 DIAGNOSIS — I10 Essential (primary) hypertension: Secondary | ICD-10-CM | POA: Diagnosis not present

## 2020-03-16 DIAGNOSIS — N529 Male erectile dysfunction, unspecified: Secondary | ICD-10-CM | POA: Diagnosis not present

## 2020-03-16 DIAGNOSIS — Z807 Family history of other malignant neoplasms of lymphoid, hematopoietic and related tissues: Secondary | ICD-10-CM | POA: Insufficient documentation

## 2020-03-16 DIAGNOSIS — Z8349 Family history of other endocrine, nutritional and metabolic diseases: Secondary | ICD-10-CM | POA: Insufficient documentation

## 2020-03-16 DIAGNOSIS — Z806 Family history of leukemia: Secondary | ICD-10-CM | POA: Insufficient documentation

## 2020-03-16 DIAGNOSIS — Z8249 Family history of ischemic heart disease and other diseases of the circulatory system: Secondary | ICD-10-CM | POA: Diagnosis not present

## 2020-03-16 DIAGNOSIS — Z87891 Personal history of nicotine dependence: Secondary | ICD-10-CM | POA: Diagnosis not present

## 2020-03-16 DIAGNOSIS — C61 Malignant neoplasm of prostate: Secondary | ICD-10-CM | POA: Insufficient documentation

## 2020-03-16 DIAGNOSIS — Z825 Family history of asthma and other chronic lower respiratory diseases: Secondary | ICD-10-CM | POA: Insufficient documentation

## 2020-03-16 HISTORY — PX: RADIOACTIVE SEED IMPLANT: SHX5150

## 2020-03-16 HISTORY — DX: Presence of spectacles and contact lenses: Z97.3

## 2020-03-16 HISTORY — PX: SPACE OAR INSTILLATION: SHX6769

## 2020-03-16 SURGERY — INSERTION, RADIATION SOURCE, PROSTATE
Anesthesia: General

## 2020-03-16 MED ORDER — ONDANSETRON HCL 4 MG/2ML IJ SOLN
INTRAMUSCULAR | Status: DC | PRN
  Administered 2020-03-16: 4 mg via INTRAVENOUS

## 2020-03-16 MED ORDER — ACETAMINOPHEN 500 MG PO TABS
1000.0000 mg | ORAL_TABLET | Freq: Once | ORAL | Status: AC
  Administered 2020-03-16: 1000 mg via ORAL

## 2020-03-16 MED ORDER — LACTATED RINGERS IV SOLN: INTRAVENOUS | Status: DC

## 2020-03-16 MED ORDER — CEFAZOLIN SODIUM-DEXTROSE 2-4 GM/100ML-% IV SOLN
INTRAVENOUS | Status: AC
  Filled 2020-03-16: qty 100

## 2020-03-16 MED ORDER — GLYCOPYRROLATE 0.2 MG/ML IJ SOLN
INTRAMUSCULAR | Status: DC | PRN
  Administered 2020-03-16: .2 mg via INTRAVENOUS

## 2020-03-16 MED ORDER — PHENYLEPHRINE HCL (PRESSORS) 10 MG/ML IV SOLN
INTRAVENOUS | Status: DC | PRN
  Administered 2020-03-16 (×5): 80 ug via INTRAVENOUS

## 2020-03-16 MED ORDER — STERILE WATER FOR IRRIGATION IR SOLN
Status: DC | PRN
  Administered 2020-03-16: 500 mL

## 2020-03-16 MED ORDER — FLEET ENEMA 7-19 GM/118ML RE ENEM: 1.0000 | ENEMA | Freq: Once | RECTAL | Status: DC

## 2020-03-16 MED ORDER — FENTANYL CITRATE (PF) 100 MCG/2ML IJ SOLN: 25.0000 ug | INTRAMUSCULAR | Status: DC | PRN

## 2020-03-16 MED ORDER — LIDOCAINE HCL (PF) 2 % IJ SOLN
INTRAMUSCULAR | Status: AC
  Filled 2020-03-16: qty 5

## 2020-03-16 MED ORDER — PROPOFOL 10 MG/ML IV BOLUS
INTRAVENOUS | Status: DC | PRN
  Administered 2020-03-16 (×2): 100 mg via INTRAVENOUS
  Administered 2020-03-16: 300 mg via INTRAVENOUS
  Administered 2020-03-16 (×2): 100 mg via INTRAVENOUS

## 2020-03-16 MED ORDER — CELECOXIB 200 MG PO CAPS
200.0000 mg | ORAL_CAPSULE | Freq: Once | ORAL | Status: AC
  Administered 2020-03-16: 200 mg via ORAL

## 2020-03-16 MED ORDER — PHENYLEPHRINE 40 MCG/ML (10ML) SYRINGE FOR IV PUSH (FOR BLOOD PRESSURE SUPPORT)
PREFILLED_SYRINGE | INTRAVENOUS | Status: AC
  Filled 2020-03-16: qty 10

## 2020-03-16 MED ORDER — PROPOFOL 10 MG/ML IV BOLUS
INTRAVENOUS | Status: AC
  Filled 2020-03-16: qty 20

## 2020-03-16 MED ORDER — FENTANYL CITRATE (PF) 250 MCG/5ML IJ SOLN
INTRAMUSCULAR | Status: AC
  Filled 2020-03-16: qty 5

## 2020-03-16 MED ORDER — EPHEDRINE SULFATE 50 MG/ML IJ SOLN
INTRAMUSCULAR | Status: DC | PRN
  Administered 2020-03-16: 20 mg via INTRAVENOUS

## 2020-03-16 MED ORDER — IOHEXOL 300 MG/ML  SOLN
INTRAMUSCULAR | Status: DC | PRN
  Administered 2020-03-16: 7 mL

## 2020-03-16 MED ORDER — CEFAZOLIN SODIUM-DEXTROSE 2-4 GM/100ML-% IV SOLN
2.0000 g | Freq: Once | INTRAVENOUS | Status: AC
  Administered 2020-03-16: 2 g via INTRAVENOUS

## 2020-03-16 MED ORDER — LIDOCAINE HCL (CARDIAC) PF 100 MG/5ML IV SOSY
PREFILLED_SYRINGE | INTRAVENOUS | Status: DC | PRN
  Administered 2020-03-16: 100 mg via INTRAVENOUS

## 2020-03-16 MED ORDER — ACETAMINOPHEN 500 MG PO TABS
ORAL_TABLET | ORAL | Status: AC
  Filled 2020-03-16: qty 2

## 2020-03-16 MED ORDER — EPHEDRINE 5 MG/ML INJ
INTRAVENOUS | Status: AC
  Filled 2020-03-16: qty 10

## 2020-03-16 MED ORDER — SODIUM CHLORIDE (PF) 0.9 % IJ SOLN
INTRAMUSCULAR | Status: DC | PRN
  Administered 2020-03-16: 10 mL

## 2020-03-16 MED ORDER — PROPOFOL 500 MG/50ML IV EMUL
INTRAVENOUS | Status: AC
  Filled 2020-03-16: qty 50

## 2020-03-16 MED ORDER — SODIUM CHLORIDE 0.9 % IV SOLN
INTRAVENOUS | Status: AC | PRN
  Administered 2020-03-16: 1000 mL via INTRAMUSCULAR

## 2020-03-16 MED ORDER — DEXAMETHASONE SODIUM PHOSPHATE 4 MG/ML IJ SOLN
INTRAMUSCULAR | Status: DC | PRN
  Administered 2020-03-16: 10 mg via INTRAVENOUS

## 2020-03-16 MED ORDER — CELECOXIB 200 MG PO CAPS
ORAL_CAPSULE | ORAL | Status: AC
  Filled 2020-03-16: qty 1

## 2020-03-16 MED ORDER — CIPROFLOXACIN IN D5W 400 MG/200ML IV SOLN: 400.0000 mg | INTRAVENOUS | Status: DC

## 2020-03-16 MED ORDER — DEXAMETHASONE SODIUM PHOSPHATE 10 MG/ML IJ SOLN
INTRAMUSCULAR | Status: AC
  Filled 2020-03-16: qty 1

## 2020-03-16 MED ORDER — CIPROFLOXACIN IN D5W 400 MG/200ML IV SOLN
INTRAVENOUS | Status: AC
  Filled 2020-03-16: qty 200

## 2020-03-16 MED ORDER — ONDANSETRON HCL 4 MG/2ML IJ SOLN
INTRAMUSCULAR | Status: AC
  Filled 2020-03-16: qty 2

## 2020-03-16 MED ORDER — FENTANYL CITRATE (PF) 100 MCG/2ML IJ SOLN
INTRAMUSCULAR | Status: AC
  Filled 2020-03-16: qty 2

## 2020-03-16 MED ORDER — FENTANYL CITRATE (PF) 100 MCG/2ML IJ SOLN
INTRAMUSCULAR | Status: DC | PRN
  Administered 2020-03-16 (×7): 50 ug via INTRAVENOUS

## 2020-03-16 SURGICAL SUPPLY — 36 items
AGX100 ×2 IMPLANT
BAG DRN RND TRDRP ANRFLXCHMBR (UROLOGICAL SUPPLIES) ×1
BAG URINE DRAIN 2000ML AR STRL (UROLOGICAL SUPPLIES) ×2 IMPLANT
BLADE CLIPPER SENSICLIP SURGIC (BLADE) ×2 IMPLANT
CATH FOLEY 2WAY SLVR  5CC 16FR (CATHETERS) ×1
CATH FOLEY 2WAY SLVR 5CC 16FR (CATHETERS) ×1 IMPLANT
CATH ROBINSON RED A/P 16FR (CATHETERS) IMPLANT
CATH ROBINSON RED A/P 20FR (CATHETERS) ×2 IMPLANT
CLOTH BEACON ORANGE TIMEOUT ST (SAFETY) ×2 IMPLANT
CNTNR URN SCR LID CUP LEK RST (MISCELLANEOUS) ×2 IMPLANT
CONT SPEC 4OZ STRL OR WHT (MISCELLANEOUS) ×4
COVER BACK TABLE 60X90IN (DRAPES) ×2 IMPLANT
COVER MAYO STAND STRL (DRAPES) ×2 IMPLANT
DRAPE C-ARM 35X43 STRL (DRAPES) ×2 IMPLANT
DRSG TEGADERM 4X4.75 (GAUZE/BANDAGES/DRESSINGS) ×2 IMPLANT
DRSG TEGADERM 8X12 (GAUZE/BANDAGES/DRESSINGS) ×4 IMPLANT
GAUZE SPONGE 4X4 12PLY STRL LF (GAUZE/BANDAGES/DRESSINGS) ×2 IMPLANT
GLOVE BIO SURGEON STRL SZ7.5 (GLOVE) IMPLANT
GLOVE BIO SURGEON STRL SZ8 (GLOVE) ×4 IMPLANT
GLOVE SURG ENC MOIS LTX SZ6.5 (GLOVE) ×2 IMPLANT
GLOVE SURG ORTHO 8.5 STRL (GLOVE) ×2 IMPLANT
GLOVE SURG SS PI 6.5 STRL IVOR (GLOVE) IMPLANT
GOWN STRL REUS W/TWL XL LVL3 (GOWN DISPOSABLE) ×2 IMPLANT
HOLDER FOLEY CATH W/STRAP (MISCELLANEOUS) ×2 IMPLANT
IMPL SPACEOAR VUE SYSTEM (Spacer) ×1 IMPLANT
IMPLANT SPACEOAR VUE SYSTEM (Spacer) ×2 IMPLANT
IV NS 1000ML (IV SOLUTION) ×2
IV NS 1000ML BAXH (IV SOLUTION) ×1 IMPLANT
KIT TURNOVER CYSTO (KITS) ×2 IMPLANT
MARKER SKIN DUAL TIP RULER LAB (MISCELLANEOUS) ×2 IMPLANT
PACK CYSTO (CUSTOM PROCEDURE TRAY) ×2 IMPLANT
SUT BONE WAX W31G (SUTURE) IMPLANT
SYR 10ML LL (SYRINGE) ×2 IMPLANT
TOWEL OR 17X26 10 PK STRL BLUE (TOWEL DISPOSABLE) ×2 IMPLANT
UNDERPAD 30X36 HEAVY ABSORB (UNDERPADS AND DIAPERS) ×4 IMPLANT
WATER STERILE IRR 500ML POUR (IV SOLUTION) ×2 IMPLANT

## 2020-03-16 NOTE — Discharge Instructions (Signed)
Radioactive Seed Implant Home Care Instructions   Activity:    Rest for the remainder of the day.  Do not drive or operate equipment today.  You may resume normal  activities in a few days as instructed by your physician, without risk of harmful radiation exposure to those around you, provided you follow the time and distance precautions on the Radiation Oncology Instruction Sheet.   Meals: Drink plenty of lipuids and eat light foods, such as gelatin or soup this evening .  You may return to normal meal plan tomorrow.  Return To Work: You may return to work as instructed by Naval architect.  Special Instruction:   If any seeds are found, use tweezers to pick up seeds and place in a glass container of any kind and bring to your physician's office.  Call your physician if any of these symptoms occur:   Persistent or heavy bleeding  Urine stream diminishes or stops completely after catheter is removed  Fever equal to or greater than 101 degrees F  Cloudy urine with a strong foul odor  Severe pain  You may feel some burning pain and/or hesitancy when you urinate after the catheter is removed.  These symptoms may increase over the next few weeks, but should diminish within forur to six weeks.  Applying moist heat to the lower abdomen or a hot tub bath may help relieve the pain.  If the discomfort becomes severe, please call your physician for additional medications.   NO ADVIL, ALEVE, MOTRIN, IBUPROFEN  UNTIL 5 PM TODAY    Post Anesthesia Home Care Instructions  Activity: Get plenty of rest for the remainder of the day. A responsible adult should stay with you for 24 hours following the procedure.  For the next 24 hours, DO NOT: -Drive a car -Paediatric nurse -Drink alcoholic beverages -Take any medication unless instructed by your physician -Make any legal decisions or sign important papers.  Meals: Start with liquid foods such as gelatin or soup. Progress to regular foods  as tolerated. Avoid greasy, spicy, heavy foods. If nausea and/or vomiting occur, drink only clear liquids until the nausea and/or vomiting subsides. Call your physician if vomiting continues.  Special Instructions/Symptoms: Your throat may feel dry or sore from the anesthesia or the breathing tube placed in your throat during surgery. If this causes discomfort, gargle with warm salt water. The discomfort should disappear within 24 hours.  If you had a scopolamine patch placed behind your ear for the management of post- operative nausea and/or vomiting:  1. The medication in the patch is effective for 72 hours, after which it should be removed.  Wrap patch in a tissue and discard in the trash. Wash hands thoroughly with soap and water. 2. You may remove the patch earlier than 72 hours if you experience unpleasant side effects which may include dry mouth, dizziness or visual disturbances. 3. Avoid touching the patch. Wash your hands with soap and water after contact with the patch.

## 2020-03-16 NOTE — Anesthesia Postprocedure Evaluation (Signed)
Anesthesia Post Note  Patient: Kalven Leclere  Procedure(s) Performed: RADIOACTIVE SEED IMPLANT/BRACHYTHERAPY IMPLANT (N/A ) SPACE OAR INSTILLATION (N/A )     Patient location during evaluation: PACU Anesthesia Type: General Level of consciousness: awake and alert Pain management: pain level controlled Vital Signs Assessment: post-procedure vital signs reviewed and stable Respiratory status: spontaneous breathing, nonlabored ventilation and respiratory function stable Cardiovascular status: blood pressure returned to baseline and stable Postop Assessment: no apparent nausea or vomiting Anesthetic complications: no   No complications documented.  Last Vitals:  Vitals:   03/16/20 1345 03/16/20 1400  BP: 126/76 125/75  Pulse: 92 83  Resp: 20 20  Temp:    SpO2: (!) 87% 92%    Last Pain:  Vitals:   03/16/20 1400  TempSrc:   PainSc: 0-No pain                 Amerigo Mcglory,W. EDMOND

## 2020-03-16 NOTE — Op Note (Signed)
Preoperative diagnosis: Clinical stage TI C adenocarcinoma the prostate   Postoperative diagnosis: Same   Procedure: I-125 prostate seed implantation, SpaceOAR placement, flexible cystoscopy  Surgeon: Lillette Boxer. Marielena Harvell M.D.  Radiation Oncologist: Tyler Pita, M.D.  Anesthesia: Gen.   Indications: Patient  was diagnosed with clinical stage TI C prostate cancer. We had extensive discussion with him about treatment options versus. He elected to proceed with seed implantation. He underwent consultation my office as well as with Dr. Tammi Klippel. He appeared to understand the advantages disadvantages potential risks of this treatment option. Full informed consent has been obtained.   Technique and findings: Patient was brought the operating room where he had successful induction of general anesthesia. He was placed in dorso-lithotomy position and prepped and draped in usual manner. Appropriate surgical timeout was performed. Radiation oncology department placed a transrectal ultrasound probe anchoring stand. Foley catheter with contrast in the balloon was inserted without difficulty. Anchoring needles were placed within the prostate. Rectal tube was placed. Real-time contouring of the urethra prostate and rectum were performed and the dosing parameters were established. Targeted dose was 145 gray.  I was then called  to the operating suite suite for placement of the needles. A second timeout was performed. All needle passage was done with real-time transrectal ultrasound guidance with the sagittal plane. A total of 17 needles were placed.  76 active seeds were implanted.  I then proceeded with placement of SpaceOAR by introducing a needle with the bevel angled inferiorly approximately 2 cm superior to the anus. This was angled downward and under direct ultrasound was placed within the space between the prostatic capsule and rectum. This was confirmed with a small amount of sterile saline injected and  this was performed under direct ultrasound. I then attached the SpaceOAR to the needle and injected this in the space between the prostate and rectum with good placement noted. The Foley catheter was removed and flexible cystoscopy failed to show any seeds outside the prostate.  The patient was brought to recovery room in stable condition, having tolerated the procedure well.Marland Kitchen

## 2020-03-16 NOTE — Anesthesia Procedure Notes (Signed)
Procedure Name: LMA Insertion Date/Time: 03/16/2020 11:46 AM Performed by: Justice Rocher, CRNA Pre-anesthesia Checklist: Patient identified, Emergency Drugs available, Suction available, Patient being monitored and Timeout performed Patient Re-evaluated:Patient Re-evaluated prior to induction Oxygen Delivery Method: Circle system utilized Preoxygenation: Pre-oxygenation with 100% oxygen Induction Type: IV induction Ventilation: Mask ventilation without difficulty LMA: LMA inserted LMA Size: 5.0 Number of attempts: 1 Airway Equipment and Method: Bite block Placement Confirmation: positive ETCO2,  breath sounds checked- equal and bilateral and CO2 detector Tube secured with: Tape Dental Injury: Teeth and Oropharynx as per pre-operative assessment

## 2020-03-16 NOTE — Anesthesia Preprocedure Evaluation (Addendum)
Anesthesia Evaluation  Patient identified by MRN, date of birth, ID band Patient awake    Reviewed: Allergy & Precautions, H&P , NPO status , Patient's Chart, lab work & pertinent test results  Airway Mallampati: III  TM Distance: >3 FB Neck ROM: Full    Dental no notable dental hx. (+) Teeth Intact, Dental Advisory Given   Pulmonary neg pulmonary ROS, former smoker,    Pulmonary exam normal breath sounds clear to auscultation       Cardiovascular hypertension, Pt. on medications  Rhythm:Regular Rate:Normal     Neuro/Psych negative neurological ROS  negative psych ROS   GI/Hepatic negative GI ROS, Neg liver ROS,   Endo/Other  negative endocrine ROS  Renal/GU negative Renal ROS  negative genitourinary   Musculoskeletal   Abdominal   Peds  Hematology negative hematology ROS (+)   Anesthesia Other Findings   Reproductive/Obstetrics negative OB ROS                            Anesthesia Physical Anesthesia Plan  ASA: II  Anesthesia Plan: General   Post-op Pain Management:    Induction: Intravenous  PONV Risk Score and Plan: 3 and Ondansetron, Dexamethasone and Midazolam  Airway Management Planned: LMA  Additional Equipment:   Intra-op Plan:   Post-operative Plan: Extubation in OR  Informed Consent: I have reviewed the patients History and Physical, chart, labs and discussed the procedure including the risks, benefits and alternatives for the proposed anesthesia with the patient or authorized representative who has indicated his/her understanding and acceptance.     Dental advisory given  Plan Discussed with: CRNA  Anesthesia Plan Comments:         Anesthesia Quick Evaluation

## 2020-03-16 NOTE — Interval H&P Note (Signed)
History and Physical Interval Note:  03/16/2020 10:59 AM  Mark Caldwell  has presented today for surgery, with the diagnosis of PROSTATE CANCER.  The various methods of treatment have been discussed with the patient and family. After consideration of risks, benefits and other options for treatment, the patient has consented to  Procedure(s) with comments: RADIOACTIVE SEED IMPLANT/BRACHYTHERAPY IMPLANT (N/A) - 90 MINS SPACE OAR INSTILLATION (N/A) as a surgical intervention.  The patient's history has been reviewed, patient examined, no change in status, stable for surgery.  I have reviewed the patient's chart and labs.  Questions were answered to the patient's satisfaction.     Mark Caldwell [name]

## 2020-03-16 NOTE — Telephone Encounter (Signed)
CALLED PATIENT TO INFORM OF POST SEED APPTS. FOR 03-17-20, SPOKE WITH PATIENT AND HE IS AWARE OF THESE APPTS.

## 2020-03-16 NOTE — Transfer of Care (Signed)
Immediate Anesthesia Transfer of Care Note  Patient: Mark Caldwell  Procedure(s) Performed: Procedure(s) (LRB): RADIOACTIVE SEED IMPLANT/BRACHYTHERAPY IMPLANT (N/A) SPACE OAR INSTILLATION (N/A)  Patient Location: PACU  Anesthesia Type: General  Level of Consciousness: awake, sedated, patient cooperative and responds to stimulation  Airway & Oxygen Therapy: Patient Spontanous Breathing and Patient connected to [place] 02 and soft FM   Post-op Assessment: Report given to PACU RN, Post -op Vital signs reviewed and stable and Patient moving all extremities  Post vital signs: Reviewed and stable  Complications: No apparent anesthesia complications

## 2020-03-16 NOTE — Progress Notes (Signed)
  Radiation Oncology         [phone] ________________________________  Name: Mark Caldwell MRN: [id number]  Date: 03/17/2020  DOB: 29-Apr-1945       Prostate Seed Implant  TG:GYIRS, Eve, MD  No ref. provider found  DIAGNOSIS: 75 y.o. gentleman with Stage T1c adenocarcinoma of the prostate with Gleason score of 3+4, and PSA of 6.46. Oncology History  Prostate cancer ([place])  10/12/2012 Initial Diagnosis   Prostate cancer ([place])   11/19/2019 Cancer Staging   Staging form: Prostate, AJCC 8th Edition - Clinical stage from 11/19/2019: Stage IIB (cT1c, cN0, cM0, PSA: 6.5, Grade Group: 2) - Signed by Freeman Caldron, PA-C on 12/14/2019     PROCEDURE: Insertion of radioactive I-125 seeds into the prostate gland.  RADIATION DOSE: 145 Gy, definitive therapy.  TECHNIQUE: Mark Caldwell was brought to the operating room with the urologist. He was placed in the dorsolithotomy position. He was catheterized and a rectal tube was inserted. The perineum was shaved, prepped and draped. The ultrasound probe was then introduced into the rectum to see the prostate gland.  TREATMENT DEVICE: A needle grid was attached to the ultrasound probe stand and anchor needles were placed.  3D PLANNING: The prostate was imaged in 3D using a sagittal sweep of the prostate probe. These images were transferred to the planning computer. There, the prostate, urethra and rectum were defined on each axial reconstructed image. Then, the software created an optimized 3D plan and a few seed positions were adjusted. The quality of the plan was reviewed using Mark Caldwell information for the target and the following two organs at risk:  Urethra and Rectum.  Then the accepted plan was printed and handed off to the radiation therapist.  Under my supervision, the custom loading of the seeds and spacers was carried out and loaded into sealed vicryl sleeves.  These pre-loaded needles were then placed into the needle holder.Marland Kitchen  PROSTATE VOLUME STUDY:   Using transrectal ultrasound the volume of the prostate was verified to be 64 cc.  SPECIAL TREATMENT PROCEDURE/SUPERVISION AND HANDLING: The pre-loaded needles were then delivered under sagittal guidance. A total of 17 needles were used to deposit 76 seeds in the prostate gland. The individual seed activity was 0.625 mCi.  SpaceOAR:  Yes  COMPLEX SIMULATION: At the end of the procedure, an anterior radiograph of the pelvis was obtained to document seed positioning and count. Cystoscopy was performed to check the urethra and bladder.  MICRODOSIMETRY: At the end of the procedure, the patient was emitting 0.078 mR/hr at 1 meter. Accordingly, he was considered safe for Caldwell discharge.  PLAN: The patient will return to the radiation oncology clinic for post implant CT dosimetry in three weeks.   ________________________________  Sheral Apley Tammi Klippel, M.D.

## 2020-03-17 ENCOUNTER — Ambulatory Visit
Admission: RE | Admit: 2020-03-17 | Discharge: 2020-03-17 | Disposition: A | Discharge status: Home / Self Care | Payer: Medicare HMO | Source: Ambulatory Visit | Attending: Radiation Oncology | Admitting: Radiation Oncology

## 2020-03-17 ENCOUNTER — Encounter (HOSPITAL_BASED_OUTPATIENT_CLINIC_OR_DEPARTMENT_OTHER): Payer: Self-pay | Admitting: Urology

## 2020-03-17 ENCOUNTER — Inpatient Hospital Stay
Admit: 2020-03-17 | Discharge: 2020-03-17 | Disposition: A | Discharge status: Home / Self Care | Payer: Self-pay | Attending: Urology | Admitting: Urology

## 2020-03-17 ENCOUNTER — Other Ambulatory Visit: Payer: Self-pay

## 2020-03-17 ENCOUNTER — Encounter: Payer: Self-pay | Admitting: Medical Oncology

## 2020-03-17 VITALS — BP 124/81 | HR 102 | Temp 97.6°F | Resp 20 | Ht 73.0 in | Wt 242.0 lb

## 2020-03-17 DIAGNOSIS — C61 Malignant neoplasm of prostate: Secondary | ICD-10-CM | POA: Diagnosis not present

## 2020-03-17 NOTE — Progress Notes (Incomplete)
Radiation Oncology         [phone]) [phone] ________________________________  Name: Mark Caldwell MRN: [id number]  Date: 03/17/2020  DOB: 1945/03/31  Post-Seed Follow-Up Visit Note  CC: Rita Ohara, MD  Franchot Gallo, MD  Diagnosis:   75 y.o. gentleman with Stage T1c adenocarcinoma of the prostate with Gleason score of 3+4, and PSA of 6.46.    ICD-10-CM   1. Prostate cancer ([place])  C61     Interval Since Last Radiation:  1 day 03/16/2020:  Insertion of radioactive I-125 seeds into the prostate gland; 145 Gy, definitive/boost therapy with*** placement of SpaceOAR gel.  Narrative:  The patient returns today for routine follow-up.  He is complaining of increased urinary frequency and urinary hesitation symptoms. He filled out a questionnaire regarding urinary function today providing and overall IPSS score of *** characterizing his symptoms as ***.  His pre-implant score was 2. He denies any bowel symptoms.  ALLERGIES:  has No Known Allergies.  Meds: Current Outpatient Medications  Medication Sig Dispense Refill  . allopurinol (ZYLOPRIM) 100 MG tablet TAKE 1 TABLET BY MOUTH EVERY DAY 90 tablet 0  . allopurinol (ZYLOPRIM) 300 MG tablet TAKE 1 TABLET BY MOUTH EVERY DAY 90 tablet 0  . aspirin EC 81 MG tablet Take 1 tablet (81 mg total) by mouth daily.    . B Complex-C (SUPER B COMPLEX PO) Take 1 tablet by mouth daily.    . Cholecalciferol (VITAMIN D) 2000 UNITS tablet Take 2,000 Units by mouth daily.    Marland Kitchen lisinopril-hydrochlorothiazide (ZESTORETIC) 20-25 MG tablet Take 1 tablet by mouth daily. 90 tablet 1  . rosuvastatin (CRESTOR) 10 MG tablet Take 1 tablet (10 mg total) by mouth daily. 90 tablet 3  . sildenafil (REVATIO) 20 MG tablet TAKE 2 TO 5 TABLETS BY MOUTH DAILY AS NEEDED 90 tablet 1  . tamsulosin (FLOMAX) 0.4 MG CAPS capsule Take 0.4 mg by mouth daily.     No current facility-administered medications for this encounter.    Physical Findings: In general this is a well appearing  *** in no acute distress. He's alert and oriented x4 and appropriate throughout the examination. Cardiopulmonary assessment is negative for acute distress and he exhibits normal effort.   Lab Findings: Lab Results  Component Value Date   WBC 7.8 03/13/2020   HGB 15.7 03/13/2020   HCT 46.4 03/13/2020   MCV 91.5 03/13/2020   PLT 257 03/13/2020    Radiographic Findings:  Patient underwent CT imaging in our clinic for post implant dosimetry. The CT will be reviewed by Dr. Tammi Klippel to confirm there is an adequate distribution of radioactive seeds throughout the prostate gland and ensure that there are no seeds in or near the rectum. His scheduled for prostate MRI *** and those images will be fused with his CT images for further evaluation. We suspect the final radiation plan and dosimetry will show appropriate coverage of the prostate gland. He understands that we will call and inform him of any unexpected findings on further review of his imaging and dosimetry.  Impression/Plan: The patient is recovering from the effects of radiation. His urinary symptoms should gradually improve over the next 4-6 months. We talked about this today. He is encouraged by his improvement already and is otherwise pleased with his outcome. We also talked about long-term follow-up for prostate cancer following seed implant. He understands that ongoing PSA determinations and digital rectal exams will help perform surveillance to rule out disease recurrence. He has a follow up appointment  scheduled with *** on ***. He understands what to expect with his PSA measures. Patient was also educated today about some of the long-term effects from radiation including a small risk for rectal bleeding and possibly erectile dysfunction. We talked about some of the general management approaches to these potential complications. However, I did encourage the patient to contact our office or return at any point if he has questions or concerns  related to his previous radiation and prostate cancer.    Mark Johns, PA-C   This document serves as a record of services personally performed by [employer], PA-C. It was created on her behalf by Wilburn Mylar, a trained medical scribe. The creation of this record is based on the scribe's personal observations and the provider's statements to them. This document has been checked and approved by the attending provider.

## 2020-03-17 NOTE — Progress Notes (Signed)
  Radiation Oncology         [phone] ________________________________  Name: Mark Caldwell MRN: [id number]  Date: 03/17/2020  DOB: 1945/04/09  COMPLEX SIMULATION NOTE  NARRATIVE:  The patient was brought to the [facility] today following prostate seed implantation approximately one month ago.  Identity was confirmed.  All relevant records and images related to the planned course of therapy were reviewed.  Then, the patient was set-up supine.  CT images were obtained.  The CT images were loaded into the planning software.  Then the prostate and rectum were contoured.  Treatment planning then occurred.  The implanted iodine 125 seeds were identified by the physics staff for projection of radiation distribution  I have requested : 3D Simulation  I have requested a DVH of the following structures: Prostate and rectum.    ________________________________  Sheral Apley Tammi Klippel, M.D.

## 2020-03-24 ENCOUNTER — Encounter: Payer: Self-pay | Admitting: Radiation Oncology

## 2020-03-24 DIAGNOSIS — C61 Malignant neoplasm of prostate: Secondary | ICD-10-CM | POA: Diagnosis not present

## 2020-03-26 NOTE — Progress Notes (Signed)
  Radiation Oncology         (336) [phone] ________________________________  Name: Blanca Thornton MRN: [id number]  Date: 03/24/2020  DOB: 22-Nov-1945  3D Planning Note   Prostate Brachytherapy Post-Implant Dosimetry  Diagnosis: 75 y.o. gentleman with Stage T1c adenocarcinoma of the prostate with Gleason score of 3+4, and PSA of 6.46.  Narrative: On a previous date, Ikey Ching returned following prostate seed implantation for post implant planning. He underwent CT scan complex simulation to delineate the three-dimensional structures of the pelvis and demonstrate the radiation distribution.  Since that time, the seed localization, and complex isodose planning with dose volume histograms have now been completed.  Results:   Prostate Coverage - The dose of radiation delivered to the 90% or more of the prostate gland (D90) was 112.43% of the prescription dose. This exceeds our goal of greater than 90%. Rectal Sparing - The volume of rectal tissue receiving the prescription dose or higher was 0.04 cc. This falls under our thresholds tolerance of 1.0 cc.  Impression: The prostate seed implant appears to show adequate target coverage and appropriate rectal sparing.  Plan:  The patient will continue to follow with urology for ongoing PSA determinations. I would anticipate a high likelihood for local tumor control with minimal risk for rectal morbidity.  ________________________________  Sheral Apley Tammi Klippel, M.D.

## 2020-04-10 ENCOUNTER — Other Ambulatory Visit: Payer: Self-pay | Admitting: Family Medicine

## 2020-04-10 DIAGNOSIS — M109 Gout, unspecified: Secondary | ICD-10-CM

## 2020-04-10 DIAGNOSIS — I10 Essential (primary) hypertension: Secondary | ICD-10-CM

## 2020-04-10 DIAGNOSIS — N529 Male erectile dysfunction, unspecified: Secondary | ICD-10-CM

## 2020-04-10 NOTE — Telephone Encounter (Signed)
Is this okay to refill? 

## 2020-04-17 ENCOUNTER — Other Ambulatory Visit: Payer: Self-pay | Admitting: Family Medicine

## 2020-04-17 DIAGNOSIS — M109 Gout, unspecified: Secondary | ICD-10-CM

## 2020-04-19 NOTE — Progress Notes (Addendum)
Patient states that he does not empty his bladder with urination.Patient reports that he voids 2-3 times in a two hour period. Patient states the he stops and starts during urination. Patient states that he has some urgency with urination. Patient reports a weak stream. Patient states that he has to push and strain to start his stream. Patient reports nocturia x 5-8. Patient reports some dysuria. Denies any hematuria. Reports some leakage . Denies any issues with his bowels. States that his next appointment with his urologist is either on  April 20 or April 21 ,2022[place] was 25

## 2020-04-20 ENCOUNTER — Other Ambulatory Visit: Payer: Self-pay

## 2020-04-20 ENCOUNTER — Encounter: Payer: Self-pay | Admitting: Medical Oncology

## 2020-04-20 ENCOUNTER — Ambulatory Visit
Admission: RE | Admit: 2020-04-20 | Discharge: 2020-04-20 | Disposition: A | Discharge status: Home / Self Care | Payer: Medicare HMO | Source: Ambulatory Visit | Attending: Urology | Admitting: Urology

## 2020-04-20 DIAGNOSIS — C61 Malignant neoplasm of prostate: Secondary | ICD-10-CM

## 2020-04-20 NOTE — Progress Notes (Signed)
Radiation Oncology         (336) [phone] ________________________________  Name: Mark Caldwell MRN: [id number]  Date: 04/20/2020  DOB: 1945-03-12  Post-Seed Follow-Up Visit Note  CC: Rita Ohara, MD  Franchot Gallo, MD  Diagnosis:   75 y.o. gentleman with Stage T1c adenocarcinoma of the prostate with Gleason score of 3+4, and PSA of 6.46.    ICD-10-CM   1. Prostate cancer ([place])  C61     Interval Since Last Radiation:  5 weeks 03/16/20:  Insertion of radioactive I-125 seeds into the prostate gland; 145 Gy, definitive therapy with placement of SpaceOAR gel.  Narrative:  I spoke with the patient to conduct his routine scheduled 1 month follow up visit via telephone to spare the patient unnecessary potential exposure in the healthcare setting during the current COVID-19 pandemic.  The patient was notified in advance and gave permission to proceed with this visit format. He is complaining of increased urinary frequency and urinary hesitation symptoms. He filled out a questionnaire regarding urinary function today providing and overall IPSS score of 23 characterizing his symptoms as severe with increased frequency, urgency, weak stream, intermittency, mild dysuria at the start of his stream and nocturia 5-8 times per night.  He was recently prescribed Flomax which he is taking daily as prescribed.  He specifically denies gross hematuria, fever, chills or night sweats.  His pre-implant score was 2. He denies any abdominal pain or bowel symptoms.  He reports a healthy appetite and is maintaining his weight.  He has noticed more difficulty getting erections and when he does, the erections are often not firm enough for penetration even despite using the sildenafil.  We discussed that this should improve over the next several weeks since the ED associated with radiotherapy would be expected several years after treatment, not immediate.  He denies any significant fatigue and overall, is pleased with his  progress to date.  ALLERGIES:  has No Known Allergies.  Meds: Current Outpatient Medications  Medication Sig Dispense Refill  . allopurinol (ZYLOPRIM) 100 MG tablet TAKE 1 TABLET BY MOUTH EVERY DAY 90 tablet 0  . allopurinol (ZYLOPRIM) 300 MG tablet TAKE 1 TABLET BY MOUTH EVERY DAY 90 tablet 0  . aspirin EC 81 MG tablet Take 1 tablet (81 mg total) by mouth daily.    . B Complex-C (SUPER B COMPLEX PO) Take 1 tablet by mouth daily.    . Cholecalciferol (VITAMIN D) 2000 UNITS tablet Take 2,000 Units by mouth daily.    Marland Kitchen lisinopril-hydrochlorothiazide (ZESTORETIC) 20-25 MG tablet TAKE 1 TABLET BY MOUTH EVERY DAY 90 tablet 0  . rosuvastatin (CRESTOR) 10 MG tablet Take 1 tablet (10 mg total) by mouth daily. 90 tablet 3  . sildenafil (REVATIO) 20 MG tablet TAKE 2 TO 5 TABLETS BY MOUTH DAILY AS NEEDED 90 tablet 1  . tamsulosin (FLOMAX) 0.4 MG CAPS capsule Take 0.4 mg by mouth daily.     No current facility-administered medications for this encounter.    Physical Findings: Unable to assess due to telephone follow-up visit format.  Lab Findings: Lab Results  Component Value Date   WBC 7.8 03/13/2020   HGB 15.7 03/13/2020   HCT 46.4 03/13/2020   MCV 91.5 03/13/2020   PLT 257 03/13/2020    Radiographic Findings:  Patient underwent CT imaging in our clinic on 03/24/20 for post implant dosimetry. The CT was reviewed by Dr. Tammi Klippel to confirm there was an adequate distribution of radioactive seeds throughout the prostate gland and ensure  that there were no seeds in or near the rectum. The final radiation plan and dosimetry showed appropriate coverage of the prostate gland and appropriate rectal sparing.  Results:   Prostate Coverage - The dose of radiation delivered to the 90% or more of the prostate gland (D90) was 112.43% of the prescription dose. This exceeds our goal of greater than 90%. Rectal Sparing - The volume of rectal tissue receiving the prescription dose or higher was 0.04 cc. This  falls under our thresholds tolerance of 1.0 cc.  Impression/Plan: 75 y.o. gentleman with Stage T1c adenocarcinoma of the prostate with Gleason score of 3+4, and PSA of 6.46. The patient is recovering from the effects of radiation. His urinary symptoms should gradually improve over the next 4-6 months. We talked about this today. He is encouraged by his improvement already and is otherwise pleased with his outcome. We also talked about long-term follow-up for prostate cancer following seed implant. He understands that ongoing PSA determinations and digital rectal exams will help perform surveillance to rule out disease recurrence. He has a follow up appointment scheduled with Dr. Diona Fanti on 06/14/2020. He understands what to expect with his PSA measures. Patient was also educated today about some of the long-term effects from radiation including a small risk for rectal bleeding and possibly erectile dysfunction. We talked about some of the general management approaches to these potential complications. However, I did encourage the patient to contact our office or return at any point if he has questions or concerns related to his previous radiation and prostate cancer.    Nicholos Johns, PA-C

## 2020-05-31 ENCOUNTER — Encounter: Payer: Self-pay | Admitting: *Deleted

## 2020-06-13 NOTE — Progress Notes (Signed)
Chief Complaint  Patient presents with  . Medicare Wellness    Fasting AWV/CPE. Runny nose started Sunday he reports. Took home covid test yesterday am-negative. Saw Dr. Diona Fanti yesterday-gets up some nights 7-8 times to urinate. Has flow issues, stream issues and urgency issues. Also wipes out sexual function-sildenafil not working at all. [place] not very optimistic-patient has not given up hope. Considering penile injections. Has to get timing right.     Mark Caldwell is a 75 y.o. male who presents for annual physical exam, Medicare wellness visit and follow-up on chronic medical conditions.   He has the following concerns:  Runny nose since Sunday (4 days ago).  Had a negative COVID test yesterday, feels like it is his allergies. Clear runny nose, some itchy/watery eyes. He took zyrtec yesterday, which helped.  He tried sudafed Monday, which didn't help. He took Claritin Tuesday, not as helpful.    Atherosclerosis of aorta and coronary arteries noted on CT in January 2020. He is taking ASA, Crestor 38m (he no longer takes CoQ10, and no side effects).  He underwent stress test with Dr. SMarlou Porchin 06/2018 which was low risk.  He denies any chest pain, palpitations, DOE. Last lipids were at goal: Lab Results  Component Value Date   CHOL 160 11/18/2019   HDL 53 11/18/2019   LDLCALC 89 11/18/2019   TRIG 99 11/18/2019   CHOLHDL 3.0 11/18/2019   Hypertension:Denies headaches, dizziness, edema, chest pain, palpitations, shortness of breath.He is compliant with lisinopril HCTZ and denies side effects. However having increased urinary frequency related to prostate cancer treatment. BP's are running 120's/80's at other doctors visit, never over 130 at home.  We previously discussed changing BP regimen, to stop the diuretic due to gout, but he preferred not to change medications; he is now willing to get rid of the diuretic to help with urinary frequency.  Gout: Dose of allopurinol was  increased from 300 to 4034mafter having flares with long drives.  Uric acid level went up to 6.5 in 11/2018, dose was further increased to 50063mtaking 300/200); f/u was 5.7 in 02/2019 and patient subsequently lowered the dose back to 400m13mily, and has been doing well without flares.  Currently he is taking 400mg69mly (300 + 100 tabs). Wife is vegetarian/pescatarian (and lamb), but when he is away from home he eats a lot of steak. Eats reports today mostly eating chicken and fish. Last uric acid level was after eating more steak than typical. Lab Results  Component Value Date   LABURIC 6.1 11/18/2019   IFG:fZHG:DJMEQASose was 106 in 10/2019, with A1c 5.7. He limits carbs. He hasn't been getting regular exercise. Lab Results  Component Value Date   HGBA1C 5.7 (A) 11/18/2019   Prostate cancer: he had been under surveillance by Dr. DahlsDiona Fantia while, but progressed (biopsy 10/2019) and underwent brachytherapy (radioactive seed implant) in 02/2020.  He is having urinary symptoms related to treatment (decreased stream, urgency, frequency) and worsened ED, sildenafil is no longer effective.  Having 5 cups of coffee/day.  99 yo79other (turns 89 on 12/23) doing well in nursing home, dementia is significant. She had COVID, treated with monoclonal antibody infusion and is doing well.  Patient is spending about 90% of his time up in [place] to be near her.  Erectile dysfunction: sildenafil is no longer effective. Discussing treatment options with urologist. Planning on trying injections, but doesn't have time to start this before heading back up to [place] next week.  Immunization History  Administered Date(s) Administered  . Fluad Quad(high Dose 65+) 11/18/2019  . Hepatitis A 01/27/2006  . Influenza Split 01/10/2009, 01/03/2011, 01/06/2012  . Influenza, High Dose Seasonal PF 02/11/2013, 01/31/2014, 02/02/2015, 02/05/2016, 11/23/2016, 11/08/2017, 11/18/2019  . Influenza,inj,Quad PF,6+ Mos 12/05/2018   . Influenza-Unspecified 12/05/2018  . PFIZER Comirnaty(Gray Top)Covid-19 Tri-Sucrose Vaccine 05/25/2020  . PFIZER(Purple Top)SARS-COV-2 Vaccination 04/07/2019, 04/28/2019, 11/18/2019  . Pneumococcal Conjugate-13 02/11/2013  . Pneumococcal Polysaccharide-23 01/31/2014  . Tdap 02/22/2009, 06/09/2019  . Zoster 01/25/2006  . Zoster Recombinat (Shingrix) 06/25/2016, 11/23/2016   He reported having more travel vaccines prior to going to [place] Last colonoscopy: 2011. Cologuard negative 08/2019.  Last PSA: per urologist Dentist: yearly Ophtho: yearly Derm: sees Dr. Delman Cheadle twice yearly Exercise:Walks down to the beach most days. "I stay busy every day".  Occasionally uses dumbbells.  Other Doctors caring for patient: Ophtho: Dr. Jerline Pain at [facility] Urology: Dr. Diona Fanti Radiation: Dr. Tammi Klippel Dermatology: Dr. Jari Pigg Dentist: Dr. Lurlean Horns: Dr. Rhona Raider GI: Dr. Penelope Coop Cardiologist: Dr. Marlou Porch  Fall Screen: None Depression screen: Negative Functional status survey: notable for some trouble with night vision (monitoring cataracts), some urinary changes and incontinence since prostate cancer treatment Mini-Cog screen: normal  See epic for full screens.  He reports having Healthcare power of attorney and living will.  He is in the process of re-doing these.   PMH, PSH, SH and FH reviewed and updated.  Outpatient Encounter Medications as of 06/15/2020  Medication Sig Note  . allopurinol (ZYLOPRIM) 300 MG tablet TAKE 1 TABLET BY MOUTH EVERY DAY   . aspirin EC 81 MG tablet Take 1 tablet (81 mg total) by mouth daily.   . B Complex-C (SUPER B COMPLEX PO) Take 1 tablet by mouth daily.   . Cholecalciferol (VITAMIN D) 2000 UNITS tablet Take 2,000 Units by mouth daily.   Marland Kitchen doxycycline (VIBRAMYCIN) 100 MG capsule Take 100 mg by mouth daily.   Marland Kitchen lisinopril (ZESTRIL) 30 MG tablet Take 1 tablet (30 mg total) by mouth daily.   . rosuvastatin (CRESTOR) 10 MG tablet Take 1 tablet (10  mg total) by mouth daily.   . tamsulosin (FLOMAX) 0.4 MG CAPS capsule Take 0.4 mg by mouth daily. 06/15/2020: Taking 2 daily  . [DISCONTINUED] allopurinol (ZYLOPRIM) 100 MG tablet TAKE 1 TABLET BY MOUTH EVERY DAY   . [DISCONTINUED] lisinopril-hydrochlorothiazide (ZESTORETIC) 20-25 MG tablet TAKE 1 TABLET BY MOUTH EVERY DAY 06/15/2020: stopping diuretic  . fluocinonide (LIDEX) 0.05 % external solution Apply topically. (Patient not taking: Reported on 06/15/2020)   . sildenafil (REVATIO) 20 MG tablet TAKE 2 TO 5 TABLETS BY MOUTH DAILY AS NEEDED (Patient not taking: Reported on 06/15/2020)    No facility-administered encounter medications on file as of 06/15/2020.   Taking lisinopril HCTZ prior to today's visit  No Known Allergies  ROS: The patient denies anorexia, fever, headaches, decreased hearing, ear pain, hoarseness, chest pain, palpitations, dizziness, syncope, dyspnea on exertion, cough, swelling, nausea, vomiting, diarrhea, constipation, abdominal pain, melena, hematochezia, hematuria, incontinence, nocturia, dysuria, genital lesions, numbness, tingling, weakness, tremor, depression, anxiety, abnormal bleeding/bruising, or enlarged lymph nodes  Some decrease in night vision. Denies any further knee pain Urinary frequency, ED per HPI. Fatigue related to interrupted sleep. Unrefreshed in the mornings. Runny nose/allergies per HPI. Some stiffness in wrists/hand in the morning, occasional mild numbness in hands, resolves quickly.    PHYSICAL EXAM:  BP 130/80   Pulse 68   Temp 97.7 F (36.5 C) (Tympanic)   Ht 5' 11.5" (1.816  m)   Wt 233 lb (105.7 kg)   BMI 32.04 kg/m   Wt Readings from Last 3 Encounters:  06/15/20 233 lb (105.7 kg)  03/17/20 242 lb (109.8 kg)  03/16/20 237 lb 4.8 oz (107.6 kg)     General Appearance:  Alert, cooperative, no distress, appears stated age   Head:  Normocephalic, without obvious abnormality, atraumatic   Eyes:  PERRL, conjunctiva/corneas  clear, EOM's intact, fundibenign, cataract noted on left. 1-1.32m flesh colored mole on right lower eye lid, in center, unchanged  Ears:  Scarring (white appearance) of L TM, normal EAC.  R--normal TM and EAC.    Nose:  Not examined, wearing mask due to COVID-19 pandemic  Throat:  Not examined, wearing mask due to COVID-19 pandemic  Neck:  Supple, no lymphadenopathy; thyroid: no enlargement/tenderness/ nodules; no carotid bruit or JVD   Back:  Spine nontender, no curvature, ROM normal, no CVA tenderness   Lungs:  Clear to auscultation bilaterally without wheezes, rales or ronchi; respirations unlabored   Chest Wall:  No tenderness or deformity   Heart:  Regular rate and rhythm, S1 and S2 normal, no murmur, rub or gallop   Breast Exam:  No chest wall tenderness, masses or gynecomastia   Abdomen:  Soft, non-tender, nondistended, normoactive bowel sounds, no masses, no hepatosplenomegaly. +abdominal obesity   Genitalia:  Deferred to urology  Rectal:  Deferred to urology   Extremities:  No clubbing, cyanosis or edema   Pulses:  2+ and symmetric all extremities   Skin:  Skin color, texture, turgor normal, no rashes or lesions.   Lymph nodes:  Cervical, supraclavicular, inguinal and axillary nodes normal   Neurologic:  Normal strength, sensation and gait; reflexes 2+ and symmetric throughout    Psych: Normal mood, affect, hygiene and grooming   Fasting glucose 111  Urine dip normal  Lab Results  Component Value Date   HGBA1C 5.6 06/15/2020     ASSESSMENT/PLAN:  Annual physical exam - Plan: POCT Urinalysis DIP (Proadvantage Device)  Medicare annual wellness visit, subsequent  Essential hypertension, benign - Well controlled; adjusting meds to try and decrease urinary frequency (and also will lower uric acid). Lisinopril 374m may need further adjustment - Plan: lisinopril (ZESTRIL) 30 MG tablet  IFG  (impaired fasting glucose) - counseled on diet, exercise, weight loss - Plan: HgB A1c, Glucose (CBG), Fasting  Gout of foot, unspecified cause, unspecified chronicity, unspecified laterality - finally convinced him to d/c HCTZ.  Will stop the 10040mllopurinol and only continue 300m12mRecheck in 1-3 mos (when BP improved and f/u labs done)  [place]ortic [place].  Pure hypercholesterolemia - controlled on statin, continue, along with low cholesterol diet  Prostate cancer (HCC)[place] s/p radioactive seed implant, with some SE. Saw urologist this week  Erectile dysfunction, unspecified erectile dysfunction type - sildenafil no longer effective (since radiation tx), plans to try injections per urologist  Class 1 obesity due to excess calories with serious comorbidity and body mass index (BMI) of 32.0 to 32.9 in adult - counseled re: diet, exercise, wt loss.  Comorbidities: HTN, HLD, gout   Had CBC and c-met in 02/2020, no other labs needed (just A1c, fasting glu, as noted above).  Advised to stop aspirin based on newest guidelines, but he reports he will continue to take it. Risks vs benefits reviewed.  Changed lisinopril HCT to lisinopril 30mg64mo monitor BP and send [place] within 2 weeks.  Further titrate up  to 13m if remains above goal.  He will then get f/u labs in [place]--will need b-met and uric acid after lisinopril dose figured out (4-6 weeks)  Since stopping HCTZ, likely won't need as high of a dose of allopurinol, so stopping the 1030m and continuing 30041maily.   Recommended at least 30 minutes of aerobic activity at least 5 days/week(150 min), weight-bearing exercise 2x/wk; proper sunscreen use reviewed; healthy diet and alcohol recommendations (less than or equal to 2 drinks/day) reviewed; regular seatbelt use; changing batteries in smoke detectors. Self-testicular exams. Immunization recommendations discussed--up to date. Continue yearly high dose flu  shots. Colon cancer screening is up to date.  Next Cologuard 08/2022.    MOST formcompleted. Full Code, Full Care. Asked to get us [place] of any new living will, healthcare POA once completed.  F/u 6 months (when in town in September).    Medicare Attestation I have personally reviewed: The patient's medical and social history Their use of alcohol, tobacco or illicit drugs Their current medications and supplements The patient's functional ability including ADLs,fall risks, home safety risks, cognitive, and hearing and visual impairment Diet and physical activities Evidence for depression or mood disorders  The patient's weight, height, BMI have been recorded in the chart.  I have made referrals, counseling, and provided education to the patient based on review of the above and I have provided the patient with a written personalized care plan for preventive services.

## 2020-06-13 NOTE — Patient Instructions (Addendum)
  HEALTH MAINTENANCE RECOMMENDATIONS:  It is recommended that you get at least 30 minutes of aerobic exercise at least 5 days/week (for weight loss, you may need as much as 60-90 minutes). This can be any activity that gets your heart rate up. This can be divided in 10-15 minute intervals if needed, but try and build up your endurance at least once a week.  Weight bearing exercise is also recommended twice weekly.  Eat a healthy diet with lots of vegetables, fruits and fiber.  "Colorful" foods have a lot of vitamins (ie green vegetables, tomatoes, red peppers, etc).  Limit sweet tea, regular sodas and alcoholic beverages, all of which has a lot of calories and sugar.  Up to 2 alcoholic drinks daily may be beneficial for men (unless trying to lose weight, watch sugars).  Drink a lot of water.  Sunscreen of at least SPF 30 should be used on all sun-exposed parts of the skin when outside between the hours of 10 am and 4 pm (not just when at beach or pool, but even with exercise, golf, tennis, and yard work!)  Use a sunscreen that says "broad spectrum" so it covers both UVA and UVB rays, and make sure to reapply every 1-2 hours.  Remember to change the batteries in your smoke detectors when changing your clock times in the spring and fall.  Carbon monoxide detectors are recommended for your home.  Use your seat belt every time you are in a car, and please drive safely and not be distracted with cell phones and texting while driving.    Mr. Oriordan , Thank you for taking time to come for your Medicare Wellness Visit. I appreciate your ongoing commitment to your health goals. Please review the following plan we discussed and let me know if I can assist you in the future.   This is a list of the screening recommended for you and due dates:  Health Maintenance  Topic Date Due  . Colon Cancer Screening  08/15/2019  . Flu Shot  09/25/2020  . Tetanus Vaccine  06/08/2029  . COVID-19 Vaccine  Completed   .  Hepatitis C: One time screening is recommended by Center for Disease Control  (CDC) for  adults born from [birth year] through 1963.   Completed  . Pneumonia vaccines  Completed  . HPV Vaccine  Aged Out   Cologuard will be due again 08/2022.  Try cutting back on your caffeine intake to see if that helps with urinary complaints.  We are stopping the lisinopril HCTZ, and instead increasing the lisinopril to 30mg  daily. Monitor your blood pressure regularly, and in 2-3 weeks send me a list.  If your blood pressure is consistently 135-140/85-90, I'm going to further increase the dose to 40mg  of lisinopril. We will need to recheck a basic metabolic panel and uric acid level about a week or two after the final changes made with medication.  I also want you to STOP the 100mg  allopurinol dose, but continue to take the 300mg  tablet.

## 2020-06-14 DIAGNOSIS — L821 Other seborrheic keratosis: Secondary | ICD-10-CM | POA: Diagnosis not present

## 2020-06-14 DIAGNOSIS — D225 Melanocytic nevi of trunk: Secondary | ICD-10-CM | POA: Diagnosis not present

## 2020-06-14 DIAGNOSIS — C61 Malignant neoplasm of prostate: Secondary | ICD-10-CM | POA: Diagnosis not present

## 2020-06-14 DIAGNOSIS — R300 Dysuria: Secondary | ICD-10-CM | POA: Diagnosis not present

## 2020-06-14 DIAGNOSIS — L739 Follicular disorder, unspecified: Secondary | ICD-10-CM | POA: Diagnosis not present

## 2020-06-14 DIAGNOSIS — D2230 Melanocytic nevi of unspecified part of face: Secondary | ICD-10-CM | POA: Diagnosis not present

## 2020-06-14 DIAGNOSIS — R350 Frequency of micturition: Secondary | ICD-10-CM | POA: Diagnosis not present

## 2020-06-14 DIAGNOSIS — L578 Other skin changes due to chronic exposure to nonionizing radiation: Secondary | ICD-10-CM | POA: Diagnosis not present

## 2020-06-14 DIAGNOSIS — R3912 Poor urinary stream: Secondary | ICD-10-CM | POA: Diagnosis not present

## 2020-06-14 DIAGNOSIS — L570 Actinic keratosis: Secondary | ICD-10-CM | POA: Diagnosis not present

## 2020-06-14 DIAGNOSIS — L662 Folliculitis decalvans: Secondary | ICD-10-CM | POA: Diagnosis not present

## 2020-06-14 DIAGNOSIS — R3911 Hesitancy of micturition: Secondary | ICD-10-CM | POA: Diagnosis not present

## 2020-06-15 ENCOUNTER — Ambulatory Visit (INDEPENDENT_AMBULATORY_CARE_PROVIDER_SITE_OTHER): Payer: Medicare HMO | Admitting: Family Medicine

## 2020-06-15 ENCOUNTER — Other Ambulatory Visit: Payer: Self-pay

## 2020-06-15 ENCOUNTER — Encounter: Payer: Self-pay | Admitting: Family Medicine

## 2020-06-15 VITALS — BP 130/80 | HR 68 | Temp 97.7°F | Ht 71.5 in | Wt 233.0 lb

## 2020-06-15 DIAGNOSIS — C61 Malignant neoplasm of prostate: Secondary | ICD-10-CM | POA: Diagnosis not present

## 2020-06-15 DIAGNOSIS — M109 Gout, unspecified: Secondary | ICD-10-CM | POA: Diagnosis not present

## 2020-06-15 DIAGNOSIS — I10 Essential (primary) hypertension: Secondary | ICD-10-CM | POA: Diagnosis not present

## 2020-06-15 DIAGNOSIS — Z6832 Body mass index (BMI) 32.0-32.9, adult: Secondary | ICD-10-CM | POA: Diagnosis not present

## 2020-06-15 DIAGNOSIS — I700 Atherosclerosis of aorta: Secondary | ICD-10-CM | POA: Diagnosis not present

## 2020-06-15 DIAGNOSIS — R7301 Impaired fasting glucose: Secondary | ICD-10-CM | POA: Diagnosis not present

## 2020-06-15 DIAGNOSIS — E6609 Other obesity due to excess calories: Secondary | ICD-10-CM

## 2020-06-15 DIAGNOSIS — N529 Male erectile dysfunction, unspecified: Secondary | ICD-10-CM | POA: Diagnosis not present

## 2020-06-15 DIAGNOSIS — E7800 Pure hypercholesterolemia, unspecified: Secondary | ICD-10-CM | POA: Diagnosis not present

## 2020-06-15 DIAGNOSIS — D692 Other nonthrombocytopenic purpura: Secondary | ICD-10-CM

## 2020-06-15 DIAGNOSIS — Z0000 Encounter for general adult medical examination without abnormal findings: Secondary | ICD-10-CM | POA: Diagnosis not present

## 2020-06-15 LAB — POCT URINALYSIS DIP (PROADVANTAGE DEVICE)
Bilirubin, UA: NEGATIVE
Blood, UA: NEGATIVE
Glucose, UA: NEGATIVE mg/dL
Ketones, POC UA: NEGATIVE mg/dL
Leukocytes, UA: NEGATIVE
Nitrite, UA: NEGATIVE
Protein Ur, POC: NEGATIVE mg/dL
Specific Gravity, Urine: 1.015
Urobilinogen, Ur: NEGATIVE
pH, UA: 7 (ref 5.0–8.0)

## 2020-06-15 LAB — POCT GLYCOSYLATED HEMOGLOBIN (HGB A1C): Hemoglobin A1C: 5.6 % (ref 4.0–5.6)

## 2020-06-15 LAB — POCT CBG (FASTING - GLUCOSE)-MANUAL ENTRY: Glucose Fasting, POC: 111 mg/dL — AB (ref 70–99)

## 2020-06-15 MED ORDER — LISINOPRIL 30 MG PO TABS: 30.0000 mg | ORAL_TABLET | Freq: Every day | ORAL | 1 refills | Status: DC

## 2020-07-09 ENCOUNTER — Other Ambulatory Visit: Payer: Self-pay | Admitting: Family Medicine

## 2020-07-09 DIAGNOSIS — M109 Gout, unspecified: Secondary | ICD-10-CM

## 2020-07-24 ENCOUNTER — Encounter: Payer: Self-pay | Admitting: Family Medicine

## 2020-07-24 ENCOUNTER — Other Ambulatory Visit: Payer: Self-pay | Admitting: Family Medicine

## 2020-07-24 DIAGNOSIS — I10 Essential (primary) hypertension: Secondary | ICD-10-CM

## 2020-07-25 DIAGNOSIS — S6992XA Unspecified injury of left wrist, hand and finger(s), initial encounter: Secondary | ICD-10-CM | POA: Diagnosis not present

## 2020-07-25 DIAGNOSIS — M25532 Pain in left wrist: Secondary | ICD-10-CM | POA: Diagnosis not present

## 2020-07-25 DIAGNOSIS — M1812 Unilateral primary osteoarthritis of first carpometacarpal joint, left hand: Secondary | ICD-10-CM | POA: Diagnosis not present

## 2020-07-25 DIAGNOSIS — S63502A Unspecified sprain of left wrist, initial encounter: Secondary | ICD-10-CM | POA: Diagnosis not present

## 2020-07-25 DIAGNOSIS — X58XXXA Exposure to other specified factors, initial encounter: Secondary | ICD-10-CM | POA: Diagnosis not present

## 2020-07-25 DIAGNOSIS — M19032 Primary osteoarthritis, left wrist: Secondary | ICD-10-CM | POA: Diagnosis not present

## 2020-07-25 DIAGNOSIS — S6392XA Sprain of unspecified part of left wrist and hand, initial encounter: Secondary | ICD-10-CM | POA: Diagnosis not present

## 2020-07-25 DIAGNOSIS — M7989 Other specified soft tissue disorders: Secondary | ICD-10-CM | POA: Diagnosis not present

## 2020-08-03 ENCOUNTER — Other Ambulatory Visit: Payer: Self-pay | Admitting: Family Medicine

## 2020-08-03 DIAGNOSIS — I10 Essential (primary) hypertension: Secondary | ICD-10-CM

## 2020-08-07 ENCOUNTER — Telehealth: Payer: Self-pay | Admitting: Family Medicine

## 2020-08-07 DIAGNOSIS — M109 Gout, unspecified: Secondary | ICD-10-CM

## 2020-08-07 DIAGNOSIS — Z5181 Encounter for therapeutic drug level monitoring: Secondary | ICD-10-CM

## 2020-08-07 NOTE — Telephone Encounter (Signed)
Pt called and wants to come have blood work done here Friday the 17th states he will be in town and needs a uric acid and a metabolic panel If that is ok

## 2020-08-07 NOTE — Telephone Encounter (Signed)
Lab orders entered. Looks like he didn't read my MyChart message from 5/30.  I need him to send [place] a list of his BP's (he can drop off when he comes, if he prefers, vs sending through [facility]).

## 2020-08-11 ENCOUNTER — Other Ambulatory Visit: Payer: Self-pay

## 2020-08-11 ENCOUNTER — Other Ambulatory Visit: Payer: Medicare HMO

## 2020-08-11 DIAGNOSIS — Z5181 Encounter for therapeutic drug level monitoring: Secondary | ICD-10-CM | POA: Diagnosis not present

## 2020-08-11 DIAGNOSIS — M109 Gout, unspecified: Secondary | ICD-10-CM

## 2020-08-12 LAB — BASIC METABOLIC PANEL WITH GFR
BUN/Creatinine Ratio: 17 (ref 10–24)
BUN: 13 mg/dL (ref 8–27)
CO2: 21 mmol/L (ref 20–29)
Calcium: 9.6 mg/dL (ref 8.6–10.2)
Chloride: 102 mmol/L (ref 96–106)
Creatinine, Ser: 0.76 mg/dL (ref 0.76–1.27)
Glucose: 104 mg/dL — ABNORMAL HIGH (ref 65–99)
Potassium: 4.4 mmol/L (ref 3.5–5.2)
Sodium: 138 mmol/L (ref 134–144)
eGFR: 95 mL/min/{1.73_m2}

## 2020-08-12 LAB — URIC ACID: Uric Acid: 4.6 mg/dL (ref 3.8–8.4)

## 2020-08-14 ENCOUNTER — Encounter: Payer: Self-pay | Admitting: Family Medicine

## 2020-08-21 ENCOUNTER — Other Ambulatory Visit: Payer: Self-pay

## 2020-08-21 ENCOUNTER — Ambulatory Visit (INDEPENDENT_AMBULATORY_CARE_PROVIDER_SITE_OTHER): Payer: Medicare HMO | Admitting: Family Medicine

## 2020-08-21 ENCOUNTER — Encounter: Payer: Self-pay | Admitting: Family Medicine

## 2020-08-21 VITALS — BP 130/80 | HR 72 | Ht 71.5 in | Wt 236.0 lb

## 2020-08-21 DIAGNOSIS — M7661 Achilles tendinitis, right leg: Secondary | ICD-10-CM

## 2020-08-21 MED ORDER — NITROGLYCERIN 0.2 MG/HR TD PT24
0.2000 mg | MEDICATED_PATCH | Freq: Every day | TRANSDERMAL | 12 refills | Status: DC
Start: 2020-08-21 — End: 2020-12-11

## 2020-08-21 MED ORDER — MELOXICAM 15 MG PO TABS: 15.0000 mg | ORAL_TABLET | Freq: Every day | ORAL | 0 refills | Status: DC

## 2020-08-21 NOTE — Progress Notes (Signed)
Chief Complaint  Patient presents with   Foot Injury    Right heel pain. Decades ago had a partial tear if his achilles. Saw Dr. Rip Harbour in the last 9 months at ortho-nothing found on imaging-unrelated to what happened decades ago and he could not help him. When it's bad he limps, some days it is ok. Just comes and goes. He wonders if it could be gout related?    He is complaining of pain at R achilles tendon for about 18 months. H/o partial tear decades ago, healed and had no problems until 18 months ago. No known injury. Dr. Rip Harbour evaluated R heel pain, told likely unrelated to prior injury.  He was advised he could wear a boot, could get PT (but patient reports this is not easy to get to in [place]). Pain comes and goes. Today he has minimal pain, 1/10.  He is trying to wear more tennis shoes rather than going barefoot. Felt it "pull" only once, when putting motorcycle into trailer.  Massaging the tendon and stretching does help some, thinks he doesn't spend as much time stretching it as he should.  He will be flying back to [place] tomorrow  PMH, PSH, SH reviewed  Briefly reviewed his recent labs--he reports his L knee joint flared when he decreased to 300mg  allopurinol.  He is back on 400mg  and prefers to stay there.  On Doxy for scalp infection (from Dr. Delman Cheadle)  Outpatient Encounter Medications as of 08/21/2020  Medication Sig Note   allopurinol (ZYLOPRIM) 100 MG tablet TAKE 1 TABLET BY MOUTH EVERY DAY    allopurinol (ZYLOPRIM) 300 MG tablet TAKE 1 TABLET BY MOUTH EVERY DAY    aspirin EC 81 MG tablet Take 1 tablet (81 mg total) by mouth daily.    B Complex-C (SUPER B COMPLEX PO) Take 1 tablet by mouth daily.    Cholecalciferol (VITAMIN D) 2000 UNITS tablet Take 2,000 Units by mouth daily.    doxycycline (VIBRAMYCIN) 100 MG capsule Take 100 mg by mouth daily.    lisinopril (ZESTRIL) 30 MG tablet TAKE 1 TABLET BY MOUTH EVERY DAY    meloxicam (MOBIC) 15 MG tablet Take 1 tablet (15 mg  total) by mouth daily.    nitroGLYCERIN (NITRODUR - DOSED IN MG/24 HR) 0.2 mg/hr patch Place 1 patch (0.2 mg total) onto the skin daily.    rosuvastatin (CRESTOR) 10 MG tablet Take 1 tablet (10 mg total) by mouth daily.    tamsulosin (FLOMAX) 0.4 MG CAPS capsule Take 0.4 mg by mouth daily. 06/15/2020: Taking 2 daily   fluocinonide (LIDEX) 0.05 % external solution Apply topically. (Patient not taking: Reported on 08/21/2020)    sildenafil (REVATIO) 20 MG tablet TAKE 2 TO 5 TABLETS BY MOUTH DAILY AS NEEDED (Patient not taking: No sig reported)    No facility-administered encounter medications on file as of 08/21/2020.   No Known Allergies  ROS: no fever, chills, URI symptoms, chest pain, GI complaints. +heel pain per HPI.  Scalp infection improving with antibiotics. Moods are good. No other joint pains currently. Knee pain flared when decreased allopurinol dose.   PHYSICAL EXAM:  BP 130/80   Pulse 72   Ht 5' 11.5" (1.816 m)   Wt 236 lb (107 kg)   BMI 32.46 kg/m   Well-appearing pleasant, obese male in no distress HEENT: conjunctiva and sclera are clear, EOMI, wearing mask Extremities: 2+ pulses, no edema. Nontender along plantar aspect of foot, calcaneous. He is tender on other side of the  achilles tendon (lateral aspects of the tendon), at lower portion of tendon. Nontender on calcaneous. No swelling, warmth. FROM Normal strength, sensation   ASSESSMENT/PLAN:  Achilles tendinitis of right lower extremity - stretches, trial of NSAID and topical NTG patch. Risks/SE reviewed in detail.  - Plan: meloxicam (MOBIC) 15 MG tablet, nitroGLYCERIN (NITRODUR - DOSED IN MG/24 HR) 0.2 mg/hr patch  Pt aware to avoid use of sildenafil while using patches (especially if at higher dose)--wife will be out of town.  He really hasn't tried course of NSAID during this time. Reviewed alternative treatments if not improving--can try to get PT up in [place], vs seeing sports med or podiatrist.  At end of  visit, he asked questions about new wt loss meds.  Only briefly discussed Wegovy, [place].

## 2020-08-21 NOTE — Patient Instructions (Addendum)
Take the meloxicam once daily with food.  If your pain completely resolves, you don't need to finish the full course. Do not use advil, motrin, aleve, naproxen, Goody or BC powder along with this medication. You MAY use any of the Tylenol products, if needed.  Use the nitroglycerin patch to the painful area--use max of 1/2 patch at a time, and less if you get any headache. Use caution with the sildenafil, there might be a decrease in blood pressure with the larger doses.  Leave it on for 12 hours, then off for 12 hours before re-applying patch.  If you need a referral for PT, let us know your preference and we can fax in the order.   Rosen's Emergency Medicine: Concepts and Clinical Practice (9th ed., pp. 3154-0086). [place], [place]: [place]. Retrieved from https://www.clinicalkey.com/#!/content/[email or web address]  Achilles Tendinitis  Achilles tendinitis is inflammation of the tough, cord-like band that attaches the lower leg muscles to the heel bone (Achilles tendon). This is usually caused by overusing the tendon and the ankle joint. Achilles tendinitis usually gets better over time with treatment and caring foryourself at home. It can take weeks or months to heal completely. What are the causes? This condition may be caused by: A sudden increase in exercise or activity, such as running. Doing the same exercises or activities, such as jumping, over and over. Not warming up calf muscles before exercising. Exercising in shoes that are worn out or not made for exercise. Having arthritis or a bone growth (spur) on the back of the heel bone. This can rub against the tendon and hurt it. Age-related wear and tear. Tendons become less flexible with age and are more likely to be injured. What are the signs or symptoms? Common symptoms of this condition include: Pain in the Achilles tendon or in the back of the leg, just above the heel. The pain  usually gets worse with exercise. Stiffness or soreness in the back of the leg, especially in the morning. Swelling of the skin over the Achilles tendon. Thickening of the tendon. Trouble standing on tiptoe. How is this diagnosed? This condition is diagnosed based on your symptoms and a physical exam. You may have tests, including: X-rays. MRI. How is this treated? The goal of treatment is to relieve symptoms and help your injury heal. Treatment may include: Decreasing or stopping activities that caused the tendinitis. This may mean switching to low-impact exercises like biking or swimming. Icing the injured area. Doing physical therapy, including strengthening and stretching exercises. Taking NSAIDs, such as ibuprofen, to help relieve pain and swelling. Using supportive shoes, wraps, heel lifts, or a walking boot (air cast). Having surgery. This may be done if your symptoms do not improve after other treatments. Using high-energy shock wave impulses to stimulate the healing process (extracorporeal shock wave therapy). This is rare. Having an injection of medicines that help relieve inflammation (corticosteroids). This is rare. Follow these instructions at home: If you have an air cast: Wear the air cast as told by your health care provider. Remove it only as told by your health care provider. Loosen it if your toes tingle, become numb, or turn cold and blue. Keep it clean. If the air cast is not waterproof: Do not let it get wet. Cover it with a watertight covering when you take a bath or shower. Managing pain, stiffness, and swelling  If directed, put ice on the injured area. To do this: If you have a removable air cast, remove it as  told by your health care provider. Put ice in a plastic bag. Place a towel between your skin and the bag. Leave the ice on for 20 minutes, 2-3 times a day. Move your toes often to reduce stiffness and swelling. Raise (elevate) your foot above the  level of your heart while you are sitting or lying down.  Activity Gradually return to your normal activities as told by your health care provider. Ask your health care provider what activities are safe for you. Do not do activities that cause pain. Consider doing low-impact exercises, like cycling or swimming. Ask your health care provider when it is safe to drive if you have an air cast on your foot. If physical therapy was prescribed, do exercises as told by your health care provider or physical therapist. General instructions If directed, wrap your foot with an elastic bandage or other wrap. This can help to keep your tendon from moving too much while it heals. Your health care provider will show you how to wrap your foot correctly. Wear supportive shoes or heel lifts only as told by your health care provider. Take over-the-counter and prescription medicines only as told by your health care provider. Keep all follow-up visits as told by your health care provider. This is important. Contact a health care provider if you: Have symptoms that get worse. Have pain that does not get better with medicine. Develop new, unexplained symptoms. Develop warmth and swelling in your foot. Have a fever. Get help right away if you: Have a sudden popping sound or sensation in your Achilles tendon followed by severe pain. Cannot move your toes or foot. Cannot put any weight on your foot. Your foot or toes become numb and look white or blue even after loosening your bandage or air cast. Summary Achilles tendinitis is inflammation of the tough, cord-like band that attaches the lower leg muscles to the heel bone (Achilles tendon). This condition is usually caused by overusing the tendon and the ankle joint. It can also be caused by arthritis or normal aging. The most common symptoms of this condition include pain, swelling, or stiffness in the Achilles tendon or in the back of the leg. This condition is  usually treated by decreasing or stopping activities that caused the tendinitis, icing the injured area, taking NSAIDs, and doing physical therapy. This information is not intended to replace advice given to you by your health care provider. Make sure you discuss any questions you have with your healthcare provider. Document Revised: 06/29/2018 Document Reviewed: 06/29/2018 Elsevier Patient Education  [place].

## 2020-08-26 ENCOUNTER — Other Ambulatory Visit: Payer: Self-pay | Admitting: Family Medicine

## 2020-08-26 DIAGNOSIS — I10 Essential (primary) hypertension: Secondary | ICD-10-CM

## 2020-09-09 ENCOUNTER — Other Ambulatory Visit: Payer: Self-pay | Admitting: Family Medicine

## 2020-09-09 DIAGNOSIS — M7661 Achilles tendinitis, right leg: Secondary | ICD-10-CM

## 2020-09-11 NOTE — Telephone Encounter (Signed)
Called patient and he did request this refill, stated this has really helped his achilles pain and wondered if you would fill again for him. Thanks.

## 2020-09-11 NOTE — Telephone Encounter (Signed)
Patient advised and refill sent.

## 2020-09-11 NOTE — Telephone Encounter (Signed)
I don't want him using it longterm.  It is okay for another 2 weeks (ok to RF).  If he has any stomach discomfort, he should daily daily prilosec along with it.  He should also monitor his BP, as this med can raise BP.  If needs it beyond that, he will need to take PPI daily, monitor BP, and regular monitoring of kidney/liver tests if using chronically (I'd prefer that he uses it just very short term).

## 2020-09-17 DIAGNOSIS — M25561 Pain in right knee: Secondary | ICD-10-CM | POA: Diagnosis not present

## 2020-09-17 DIAGNOSIS — M1711 Unilateral primary osteoarthritis, right knee: Secondary | ICD-10-CM | POA: Diagnosis not present

## 2020-09-22 DIAGNOSIS — M7121 Synovial cyst of popliteal space [Baker], right knee: Secondary | ICD-10-CM | POA: Diagnosis not present

## 2020-09-22 DIAGNOSIS — M25461 Effusion, right knee: Secondary | ICD-10-CM | POA: Diagnosis not present

## 2020-09-22 DIAGNOSIS — M25561 Pain in right knee: Secondary | ICD-10-CM | POA: Diagnosis not present

## 2020-09-22 DIAGNOSIS — M1711 Unilateral primary osteoarthritis, right knee: Secondary | ICD-10-CM | POA: Diagnosis not present

## 2020-10-03 ENCOUNTER — Other Ambulatory Visit: Payer: Self-pay | Admitting: Family Medicine

## 2020-10-03 DIAGNOSIS — I10 Essential (primary) hypertension: Secondary | ICD-10-CM

## 2020-10-03 DIAGNOSIS — M7661 Achilles tendinitis, right leg: Secondary | ICD-10-CM

## 2020-10-03 NOTE — Telephone Encounter (Signed)
No.  This was prescribed a month ago for achilles tendonitis.  Pretty sure that his wife mentioned that he hadn't even been using it, but I could be wrong.  He mainly lives in [place] now, so truly doubt he wants this [facility] (and definitely not in that quantity).  You will need to check with pt to see if he needs this or not.

## 2020-10-04 DIAGNOSIS — M1711 Unilateral primary osteoarthritis, right knee: Secondary | ICD-10-CM | POA: Diagnosis not present

## 2020-10-04 DIAGNOSIS — M7121 Synovial cyst of popliteal space [Baker], right knee: Secondary | ICD-10-CM | POA: Diagnosis not present

## 2020-10-04 DIAGNOSIS — M25561 Pain in right knee: Secondary | ICD-10-CM | POA: Diagnosis not present

## 2020-10-04 DIAGNOSIS — M25461 Effusion, right knee: Secondary | ICD-10-CM | POA: Diagnosis not present

## 2020-10-12 DIAGNOSIS — U071 COVID-19: Secondary | ICD-10-CM | POA: Diagnosis not present

## 2020-11-01 DIAGNOSIS — M1711 Unilateral primary osteoarthritis, right knee: Secondary | ICD-10-CM | POA: Diagnosis not present

## 2020-11-04 ENCOUNTER — Other Ambulatory Visit: Payer: Self-pay | Admitting: Family Medicine

## 2020-11-04 DIAGNOSIS — M109 Gout, unspecified: Secondary | ICD-10-CM

## 2020-11-04 DIAGNOSIS — E7800 Pure hypercholesterolemia, unspecified: Secondary | ICD-10-CM

## 2020-11-04 DIAGNOSIS — I25119 Atherosclerotic heart disease of native coronary artery with unspecified angina pectoris: Secondary | ICD-10-CM

## 2020-11-07 LAB — BASIC METABOLIC PANEL: Creatinine: 0.8 (ref <=1.3)

## 2020-11-07 LAB — HEMOGLOBIN A1C: Hemoglobin A1C: 5.6

## 2020-11-07 LAB — LIPID PANEL: LDL Cholesterol: 68

## 2020-11-07 LAB — PSA: PSA: 0.22

## 2020-11-09 ENCOUNTER — Other Ambulatory Visit: Payer: Self-pay | Admitting: Family Medicine

## 2020-11-09 DIAGNOSIS — I10 Essential (primary) hypertension: Secondary | ICD-10-CM

## 2020-11-13 NOTE — Progress Notes (Signed)
Chief Complaint  Patient presents with   Hypertension    He would like to know if he could go back on lisinopril HCT-states he feels like his readings were better. Also left ear feels stopped up. Is going to wait until Nov for covid booster.    He had COVID last month, treated with Paxlovid. He fully recovered, would like to wait longer for new COVID booster.  He is complaining of some L ear plugging.  Family members had wax issues, wants his ear checked.  Denies any allergy symptoms currently. Denies pain with recent flying.  Seen in [place] 08/2020 with R knee pain, treated with prednisone 9m x 5 days. X-ray showed mild-mod degenerative changes. [place]  He had fluid drained from the R knee twice, along with cortisone injections.  He only had short-term benefit from the injections.  Still has pain and is planning to see Dr. DRhona Raiderthis afternoon for his opinion.  Has approval from insurance for gel injections. He will start PT later this month.  He was last seen here in June with R achilles tendonitis, treated with Meloxicam. This helped a lot.  He called for a refill in July (and was stopped when he switched to prednisone for his knee). He is now taking the meloxicam for his knee as well.  Ankle feels much better.  Atherosclerosis of aorta and coronary arteries noted on CT in January 2020. He is taking ASA, Crestor 173m  He underwent stress test with Dr. SkMarlou Porchn 06/2018 which was low risk.  He denies any chest pain, palpitations, DOE. He had labs done 11/07/20: Total chol 142, TG 104, LDL 68, HDL 55, chol/HDL ratio 2.6, hs-CRP 2.0 (average risk)   Hypertension:  Denies headaches, dizziness, edema, chest pain, palpitations, shortness of breath. He is compliant with lisinopril 30110maily (changed from lisinopril-HCT in 05/2020) and denies side effects. BP's were sent in June, averaging low 130's/75. Reports seeing higher values now, more 135-140/mid-upper 70's.  He recalls  seeing 125321-224stolic when on the combo in the past, asking to restart lisinopril HCT, as he wants to get back to the lower values. Chem panel done 9/13 was normal  (see scanned result).   Gout: Patient is currently taking 400m50m allopurinol.  He had tried decreasing it to 300mg19mcommended in April, when his HCTZ was stopped), but had recurrent problems in the knee at the lower dose. (Reports recent fluid has been clear, not gouty).  He is back on 400g and denies any recent gout flares. Lab Results  Component Value Date   LABURIC 4.6 08/11/2020  He had uric acid level repeated with labs on 9/13, and level was 5.2.   IFG: He limits carbs. He hasn't been getting regular exercise. "I've broken up with cannoli's", no longer eating them regularly. 11/07/20-- A1c was 5.6% (same as in April) and fasting glucose was 107.  Prostate cancer: he had been under surveillance by Dr. DahlsDiona Fantia while, but progressed (biopsy 10/2019) and underwent brachytherapy (radioactive seed implant) in 02/2020.  He saw him today and PSA was 0.22, and is very happy.  He was having urinary symptoms related to treatment (decreased stream, urgency, frequency) and worsened ED.  He is currently using tamsulosin (at one point he needed 2 tablets, but is now doing well on 1 tablet).  ED improved, and sildenafil is now effective, along with cock ring.   PMH, PSH, SH reviewed  Outpatient Encounter Medications as of  11/15/2020  Medication Sig   allopurinol (ZYLOPRIM) 100 MG tablet TAKE 1 TABLET BY MOUTH EVERY DAY   allopurinol (ZYLOPRIM) 300 MG tablet TAKE 1 TABLET BY MOUTH EVERY DAY   aspirin EC 81 MG tablet Take 1 tablet (81 mg total) by mouth daily.   B Complex-C (SUPER B COMPLEX PO) Take 1 tablet by mouth daily.   Cholecalciferol (VITAMIN D) 2000 UNITS tablet Take 2,000 Units by mouth daily.   doxycycline (VIBRAMYCIN) 100 MG capsule Take 100 mg by mouth daily.   meloxicam (MOBIC) 15 MG tablet TAKE 1 TABLET (15 MG  TOTAL) BY MOUTH DAILY.   rosuvastatin (CRESTOR) 10 MG tablet TAKE 1 TABLET BY MOUTH EVERY DAY   tamsulosin (FLOMAX) 0.4 MG CAPS capsule Take 0.4 mg by mouth daily.   [DISCONTINUED] lisinopril (ZESTRIL) 30 MG tablet TAKE 1 TABLET BY MOUTH EVERY DAY   fluocinonide (LIDEX) 0.05 % external solution Apply topically. (Patient not taking: No sig reported)   lisinopril (ZESTRIL) 40 MG tablet Take 1 tablet (40 mg total) by mouth daily.   nitroGLYCERIN (NITRODUR - DOSED IN MG/24 HR) 0.2 mg/hr patch Place 1 patch (0.2 mg total) onto the skin daily. (Patient not taking: Reported on 11/15/2020)   sildenafil (REVATIO) 20 MG tablet TAKE 2 TO 5 TABLETS BY MOUTH DAILY AS NEEDED (Patient not taking: No sig reported)   No facility-administered encounter medications on file as of 11/15/2020.   Taking 23m of lisinopril prior to today's visit, not 432m No Known Allergies  ROS: no fever, chills, URI symptoms, headaches, dizziness, chest pain, shortness of breath, nausea, vomiting, heartburn, bowel changes. Moods are overall good. Heel pain improved, now with R knee pain per HPI. Urinary frequency improved.  ED improved, treatments are now more effective. Plugging of L ear, no pain.   PHYSICAL EXAM:  BP 130/74   Pulse 68   Ht 5' 11.5" (1.816 m)   Wt 237 lb 12.8 oz (107.9 kg)   BMI 32.70 kg/m   Wt Readings from Last 3 Encounters:  11/15/20 237 lb 12.8 oz (107.9 kg)  08/21/20 236 lb (107 kg)  06/15/20 233 lb (105.7 kg)   Well appearing, pleasant male in no distress HEENT: conjunctiva and sclera clear, EOMI. L ear--moderate amount of cerumen, not completely obstructing.  Visualized TM had slightly abnormal light reflex. After ear lavage--TM appeared more white. No erythema or effusion.  Normal canal.  Patient was wearing mask throughout visit, OP not examined. Neck: no lymphadenopathy, thyromegaly or mass Heart: regular rate and rhythm Lungs: clear bilaterally Abdomen: soft, nontender, no mass Back:  no spinal or CVA tenderness Extremities: no edema, normal pulses.  He is mildly tender over a slightly thickened achilles tendon on the right.  Wearing knee brace on R knee (not examined). Skin: normal turgor, no visible rash Psych: normal mood, affect, hygiene and grooming Neuro: alert and oriented, normal gait   Labs done 11/07/20: Total chol 142, TG 104, LDL 68, HDL 55, chol/HDL ratio 2.6, hs-CRP 2.0 (average risk) A1c 5.6%, fasting glucose 107 Cr 0.75 Urine: SG 1.021, negative  Normal LFT's PSA 0.22 Vitamin D,25 40 CBC normal, Hgb 15.4 Uric acid 5.2 BP 138/74   ASSESSMENT/PLAN:  Problem List Items Addressed This Visit       Unprioritized   Aortic atherosclerosis ([place]   Cont crestor. He is also on ASA.  Discussed changes in guideline for primary prevention, and can stop aspirin.      Relevant Medications   lisinopril (ZESTRIL)  40 MG tablet   Class 1 obesity due to excess calories with serious comorbidity and body mass index (BMI) of 32.0 to 32.9 in adult    comorbidities include HTN, HLD, OA knee, IFG.  Counseled re: diet, exercise, portions      ED (erectile dysfunction)    Had worsened related to radiation for prostate CA.  This has improved to the point where sildenafil, along with a ring, is now effective for him.  The urgency/frequency related to the radiation has improved with tamsulosin      Essential hypertension, benign - Primary    BP's fine in June, higher now. Suspect knee pain/NSAIDs may contribute. Discussed meds--Increase lisinopril to 7m (not restart combo with HCTZ, given his gout, as he had requested). Cont to monitor BP. Will need b-met checked within the next couple of months, plan to do this in November when [place] in [place] Encouraged low Na diet, daily exercise (to find ones he can tolerated), and weight loss strongly encouraged.      Relevant Medications   lisinopril (ZESTRIL) 40 MG tablet   Gout    Continue 4090mallopurinol. Uric acid 5.2 on  recent labs      IFG (impaired fasting glucose)    A1c now normal, glu 107.  Cont to work on limiting sugar, carbs, exercise and weight loss      [place] cancer ([place]   S/p brachytherapy 02/2020, with recent PSA 0.22. Cont f/u with Dr. DaDiona Fanti    Pure hypercholesterolemia   Relevant Medications   lisinopril (ZESTRIL) 40 MG tablet   Other Visit Diagnoses     Need for influenza vaccination       Relevant Orders   Flu Vaccine QUAD High Dose(Fluad) (Completed)   Impacted cerumen of left ear       resolution of symptoms s/p lavage       Pt to call and schedule CPE for 05/2021. To get b-met and BP check (NV or OV, depending on when he is in [place] and if I'm in office) when in town in November.

## 2020-11-15 ENCOUNTER — Encounter: Payer: Self-pay | Admitting: Family Medicine

## 2020-11-15 ENCOUNTER — Other Ambulatory Visit: Payer: Self-pay

## 2020-11-15 ENCOUNTER — Ambulatory Visit (INDEPENDENT_AMBULATORY_CARE_PROVIDER_SITE_OTHER): Payer: Medicare HMO | Admitting: Family Medicine

## 2020-11-15 VITALS — BP 130/74 | HR 68 | Ht 71.5 in | Wt 237.8 lb

## 2020-11-15 DIAGNOSIS — C61 Malignant neoplasm of prostate: Secondary | ICD-10-CM | POA: Diagnosis not present

## 2020-11-15 DIAGNOSIS — Z8546 Personal history of malignant neoplasm of prostate: Secondary | ICD-10-CM | POA: Diagnosis not present

## 2020-11-15 DIAGNOSIS — I700 Atherosclerosis of aorta: Secondary | ICD-10-CM | POA: Diagnosis not present

## 2020-11-15 DIAGNOSIS — M109 Gout, unspecified: Secondary | ICD-10-CM | POA: Diagnosis not present

## 2020-11-15 DIAGNOSIS — Z6832 Body mass index (BMI) 32.0-32.9, adult: Secondary | ICD-10-CM

## 2020-11-15 DIAGNOSIS — H6122 Impacted cerumen, left ear: Secondary | ICD-10-CM | POA: Diagnosis not present

## 2020-11-15 DIAGNOSIS — M7661 Achilles tendinitis, right leg: Secondary | ICD-10-CM | POA: Diagnosis not present

## 2020-11-15 DIAGNOSIS — Z23 Encounter for immunization: Secondary | ICD-10-CM

## 2020-11-15 DIAGNOSIS — N529 Male erectile dysfunction, unspecified: Secondary | ICD-10-CM | POA: Diagnosis not present

## 2020-11-15 DIAGNOSIS — E7800 Pure hypercholesterolemia, unspecified: Secondary | ICD-10-CM | POA: Diagnosis not present

## 2020-11-15 DIAGNOSIS — E6609 Other obesity due to excess calories: Secondary | ICD-10-CM

## 2020-11-15 DIAGNOSIS — R7301 Impaired fasting glucose: Secondary | ICD-10-CM | POA: Diagnosis not present

## 2020-11-15 DIAGNOSIS — M1711 Unilateral primary osteoarthritis, right knee: Secondary | ICD-10-CM | POA: Diagnosis not present

## 2020-11-15 DIAGNOSIS — N5201 Erectile dysfunction due to arterial insufficiency: Secondary | ICD-10-CM | POA: Diagnosis not present

## 2020-11-15 DIAGNOSIS — I10 Essential (primary) hypertension: Secondary | ICD-10-CM | POA: Diagnosis not present

## 2020-11-15 MED ORDER — LISINOPRIL 40 MG PO TABS: 40.0000 mg | ORAL_TABLET | Freq: Every day | ORAL | 0 refills | Status: DC

## 2020-11-15 NOTE — Patient Instructions (Addendum)
You may stop taking aspirin preventatively (it is no longer indicated for primary prevention). Continue the statin.  Increase lisinopril from 30 to 40mg . Continue to limit sodium in your diet. Continue to monitor blood pressure. You will need a chem panel rechecked in 1-2 months. You can schedule a lab visit here and drop off blood pressures when you come, vs schedule an office visit.

## 2020-11-17 DIAGNOSIS — E6609 Other obesity due to excess calories: Secondary | ICD-10-CM | POA: Insufficient documentation

## 2020-11-17 DIAGNOSIS — E7800 Pure hypercholesterolemia, unspecified: Secondary | ICD-10-CM | POA: Insufficient documentation

## 2020-11-17 DIAGNOSIS — Z6832 Body mass index (BMI) 32.0-32.9, adult: Secondary | ICD-10-CM | POA: Insufficient documentation

## 2020-11-17 DIAGNOSIS — R7301 Impaired fasting glucose: Secondary | ICD-10-CM | POA: Insufficient documentation

## 2020-11-17 DIAGNOSIS — I700 Atherosclerosis of aorta: Secondary | ICD-10-CM | POA: Insufficient documentation

## 2020-11-17 NOTE — Assessment & Plan Note (Signed)
Continue 400mg  allopurinol. Uric acid 5.2 on recent labs

## 2020-11-17 NOTE — Assessment & Plan Note (Signed)
Cont crestor. He is also on ASA.  Discussed changes in guideline for primary prevention, and can stop aspirin.

## 2020-11-17 NOTE — Assessment & Plan Note (Signed)
A1c now normal, glu 107.  Cont to work on limiting sugar, carbs, exercise and weight loss

## 2020-11-17 NOTE — Assessment & Plan Note (Signed)
comorbidities include HTN, HLD, OA knee, IFG.  Counseled re: diet, exercise, portions

## 2020-11-17 NOTE — Assessment & Plan Note (Signed)
Had worsened related to radiation for prostate CA.  This has improved to the point where sildenafil, along with a ring, is now effective for him.  The urgency/frequency related to the radiation has improved with tamsulosin

## 2020-11-17 NOTE — Assessment & Plan Note (Signed)
BP's fine in June, higher now. Suspect knee pain/NSAIDs may contribute. Discussed meds--Increase lisinopril to 3m (not restart combo with HCTZ, given his gout, as he had requested). Cont to monitor BP. Will need b-met checked within the next couple of months, plan to do this in November [place] Encouraged low Na diet, daily exercise (to find ones he can tolerated), and weight loss strongly encouraged.

## 2020-11-17 NOTE — Assessment & Plan Note (Signed)
S/p brachytherapy 02/2020, with recent PSA 0.22. Cont f/u with Dr. Diona Fanti

## 2020-11-21 ENCOUNTER — Encounter: Payer: Self-pay | Admitting: Family Medicine

## 2020-11-21 DIAGNOSIS — M1710 Unilateral primary osteoarthritis, unspecified knee: Secondary | ICD-10-CM | POA: Insufficient documentation

## 2020-11-21 DIAGNOSIS — M179 Osteoarthritis of knee, unspecified: Secondary | ICD-10-CM | POA: Insufficient documentation

## 2020-11-21 DIAGNOSIS — M25561 Pain in right knee: Secondary | ICD-10-CM | POA: Diagnosis not present

## 2020-11-21 DIAGNOSIS — M1711 Unilateral primary osteoarthritis, right knee: Secondary | ICD-10-CM | POA: Diagnosis not present

## 2020-11-21 DIAGNOSIS — M25461 Effusion, right knee: Secondary | ICD-10-CM | POA: Diagnosis not present

## 2020-11-22 DIAGNOSIS — M25561 Pain in right knee: Secondary | ICD-10-CM | POA: Diagnosis not present

## 2020-11-22 DIAGNOSIS — M1711 Unilateral primary osteoarthritis, right knee: Secondary | ICD-10-CM | POA: Diagnosis not present

## 2020-11-24 DIAGNOSIS — M1711 Unilateral primary osteoarthritis, right knee: Secondary | ICD-10-CM | POA: Diagnosis not present

## 2020-11-24 DIAGNOSIS — M25561 Pain in right knee: Secondary | ICD-10-CM | POA: Diagnosis not present

## 2020-11-28 DIAGNOSIS — M1711 Unilateral primary osteoarthritis, right knee: Secondary | ICD-10-CM | POA: Diagnosis not present

## 2020-11-30 DIAGNOSIS — M1711 Unilateral primary osteoarthritis, right knee: Secondary | ICD-10-CM | POA: Diagnosis not present

## 2020-12-05 DIAGNOSIS — M1711 Unilateral primary osteoarthritis, right knee: Secondary | ICD-10-CM | POA: Diagnosis not present

## 2020-12-11 ENCOUNTER — Other Ambulatory Visit: Payer: Self-pay

## 2020-12-11 ENCOUNTER — Ambulatory Visit (INDEPENDENT_AMBULATORY_CARE_PROVIDER_SITE_OTHER): Payer: Medicare HMO | Admitting: Family Medicine

## 2020-12-11 ENCOUNTER — Encounter: Payer: Self-pay | Admitting: Family Medicine

## 2020-12-11 VITALS — BP 110/68 | HR 60 | Ht 71.5 in | Wt 233.8 lb

## 2020-12-11 DIAGNOSIS — R7301 Impaired fasting glucose: Secondary | ICD-10-CM | POA: Diagnosis not present

## 2020-12-11 DIAGNOSIS — Z5181 Encounter for therapeutic drug level monitoring: Secondary | ICD-10-CM | POA: Diagnosis not present

## 2020-12-11 DIAGNOSIS — I10 Essential (primary) hypertension: Secondary | ICD-10-CM | POA: Diagnosis not present

## 2020-12-11 NOTE — Progress Notes (Signed)
Chief Complaint  Patient presents with   Follow-up    Fasting follow up. Brought in a small list of bp's.     Patient presents for follow-up on blood pressure.  At his visit last month his lisinopril dose was increased to 40mg , as his home BP's had been running higher than he liked.  BP's at home are now running 127-145/69-91.  Recalls once seeing is <100, didn't feel dizzy. Machine is old, hasn't been verified as accurate (maybe once a long time ago), but is up in [place]. He feels like the 40mg  of lisinopril is working well, he is pleased with the improved blood pressure readings.  He denies any headache, cough, chest pain, shortness of breath, edema.  BP Readings from Last 3 Encounters:  12/11/20 110/68  11/15/20 130/74  08/21/20 130/80    R knee pain.  Had 1 viscosupplementation in R knee, pain is slightly better.  Could hardly walk after 12 hour car ride from [place] to [place]. Dr. Rhona Raider stopped meloxicam, switched to Celebrex, which maybe helps a little.  An OT had suggested 2 Aleve, asking if he can take this.    PMH, PSH, SH reviewed  Outpatient Encounter Medications as of 12/11/2020  Medication Sig   allopurinol (ZYLOPRIM) 100 MG tablet TAKE 1 TABLET BY MOUTH EVERY DAY   allopurinol (ZYLOPRIM) 300 MG tablet TAKE 1 TABLET BY MOUTH EVERY DAY   aspirin EC 81 MG tablet Take 1 tablet (81 mg total) by mouth daily.   B Complex-C (SUPER B COMPLEX PO) Take 1 tablet by mouth daily.   CELEBREX 200 MG capsule Take 200 mg by mouth daily.   Cholecalciferol (VITAMIN D) 2000 UNITS tablet Take 2,000 Units by mouth daily.   lisinopril (ZESTRIL) 40 MG tablet Take 1 tablet (40 mg total) by mouth daily.   rosuvastatin (CRESTOR) 10 MG tablet TAKE 1 TABLET BY MOUTH EVERY DAY   tamsulosin (FLOMAX) 0.4 MG CAPS capsule Take 0.4 mg by mouth daily.   fluocinonide (LIDEX) 0.05 % external solution Apply topically. (Patient not taking: No sig reported)   sildenafil (REVATIO) 20 MG tablet TAKE 2 TO 5 TABLETS BY  MOUTH DAILY AS NEEDED (Patient not taking: No sig reported)   [DISCONTINUED] doxycycline (VIBRAMYCIN) 100 MG capsule Take 100 mg by mouth daily.   [DISCONTINUED] meloxicam (MOBIC) 15 MG tablet TAKE 1 TABLET (15 MG TOTAL) BY MOUTH DAILY.   [DISCONTINUED] nitroGLYCERIN (NITRODUR - DOSED IN MG/24 HR) 0.2 mg/hr patch Place 1 patch (0.2 mg total) onto the skin daily. (Patient not taking: No sig reported)   No facility-administered encounter medications on file as of 12/11/2020.   No Known Allergies  ROS: Denies fever, chills, URI symptoms, headaches, dizziness, shortness of breath, chest pain.  Denies nausea, vomiting, bowel changes, urinary complaints, bleeding, bruising, rash. Moods are good. Knee pain per HPI.   PHYSICAL EXAM:  BP 110/68   Pulse 60   Ht 5' 11.5" (1.816 m)   Wt 233 lb 12.8 oz (106.1 kg)   BMI 32.15 kg/m   134/82 on repeat by MD (after talking about vacations)  Wt Readings from Last 3 Encounters:  12/11/20 233 lb 12.8 oz (106.1 kg)  11/15/20 237 lb 12.8 oz (107.9 kg)  08/21/20 236 lb (107 kg)   Pleasant male in no distress HEENT: conjunctiva and sclera are clear, EOMI, wearing mask Neck: no lymphadenopathy, thyromegaly or mass, no bruit Heart: regular rate and rhythm Lungs: clear bilaterally Extremities: no edema Psych: normal mood, affect, hygiene  and grooming Neuro: alert and oriented, normal strength, gait   ASSESSMENT/PLAN:  Essential hypertension, benign - improved control. Cont lisinopril 40mg . Encouraged continued weight loss - Plan: Basic metabolic panel  IFG (impaired fasting glucose) - Plan: Basic metabolic panel  Medication monitoring encounter - Plan: Basic metabolic panel

## 2020-12-12 LAB — BASIC METABOLIC PANEL
BUN/Creatinine Ratio: 18 (ref 10–24)
BUN: 15 mg/dL (ref 8–27)
CO2: 21 mmol/L (ref 20–29)
Calcium: 10 mg/dL (ref 8.6–10.2)
Chloride: 103 mmol/L (ref 96–106)
Creatinine, Ser: 0.85 mg/dL (ref 0.76–1.27)
Glucose: 99 mg/dL (ref 70–99)
Potassium: 4.6 mmol/L (ref 3.5–5.2)
Sodium: 138 mmol/L (ref 134–144)
eGFR: 92 mL/min/{1.73_m2} (ref >59)

## 2020-12-13 DIAGNOSIS — L821 Other seborrheic keratosis: Secondary | ICD-10-CM | POA: Diagnosis not present

## 2020-12-13 DIAGNOSIS — L570 Actinic keratosis: Secondary | ICD-10-CM | POA: Diagnosis not present

## 2020-12-13 DIAGNOSIS — D171 Benign lipomatous neoplasm of skin and subcutaneous tissue of trunk: Secondary | ICD-10-CM | POA: Diagnosis not present

## 2020-12-13 DIAGNOSIS — L578 Other skin changes due to chronic exposure to nonionizing radiation: Secondary | ICD-10-CM | POA: Diagnosis not present

## 2020-12-13 DIAGNOSIS — D225 Melanocytic nevi of trunk: Secondary | ICD-10-CM | POA: Diagnosis not present

## 2020-12-13 DIAGNOSIS — L720 Epidermal cyst: Secondary | ICD-10-CM | POA: Diagnosis not present

## 2020-12-15 DIAGNOSIS — M1711 Unilateral primary osteoarthritis, right knee: Secondary | ICD-10-CM | POA: Diagnosis not present

## 2020-12-18 DIAGNOSIS — M1711 Unilateral primary osteoarthritis, right knee: Secondary | ICD-10-CM | POA: Diagnosis not present

## 2020-12-20 DIAGNOSIS — M1711 Unilateral primary osteoarthritis, right knee: Secondary | ICD-10-CM | POA: Diagnosis not present

## 2020-12-22 ENCOUNTER — Other Ambulatory Visit: Payer: Self-pay | Admitting: Family Medicine

## 2020-12-22 DIAGNOSIS — I10 Essential (primary) hypertension: Secondary | ICD-10-CM

## 2020-12-22 DIAGNOSIS — E7800 Pure hypercholesterolemia, unspecified: Secondary | ICD-10-CM

## 2020-12-22 DIAGNOSIS — N529 Male erectile dysfunction, unspecified: Secondary | ICD-10-CM

## 2020-12-22 DIAGNOSIS — M109 Gout, unspecified: Secondary | ICD-10-CM

## 2020-12-22 DIAGNOSIS — I25119 Atherosclerotic heart disease of native coronary artery with unspecified angina pectoris: Secondary | ICD-10-CM

## 2020-12-22 NOTE — Telephone Encounter (Signed)
Too early for all meds besides sildenafil. Left pt a VM to call back to discuss if he needs. 90 day supply of other medications were sent in September so he should have 2 more months left of prescriptions

## 2020-12-22 NOTE — Telephone Encounter (Signed)
Patient called back and he states he is unsure if he needs these or not, he wishes to check his medicine cabinet this weekend and call back Monday to let us know if he needs refills or not

## 2020-12-25 MED ORDER — ALLOPURINOL 100 MG PO TABS: 100.0000 mg | ORAL_TABLET | Freq: Every day | ORAL | 1 refills | Status: DC

## 2020-12-25 NOTE — Telephone Encounter (Signed)
Spoke to patient and he states the only refills he needs is the Allopurinol 100 mg ( not the 300 mg) states he uses with the 300 mg to equal 400 mg and his sildenafil only

## 2021-01-07 ENCOUNTER — Telehealth: Payer: Self-pay

## 2021-01-07 NOTE — Telephone Encounter (Signed)
P.A. SILDENAFIL  

## 2021-01-10 DIAGNOSIS — M1712 Unilateral primary osteoarthritis, left knee: Secondary | ICD-10-CM | POA: Diagnosis not present

## 2021-01-10 DIAGNOSIS — M25562 Pain in left knee: Secondary | ICD-10-CM | POA: Diagnosis not present

## 2021-01-10 DIAGNOSIS — M1711 Unilateral primary osteoarthritis, right knee: Secondary | ICD-10-CM | POA: Diagnosis not present

## 2021-01-13 NOTE — Telephone Encounter (Signed)
P.A. denied, Medicare exclusion, pt has been given Good rx info

## 2021-01-23 DIAGNOSIS — M25462 Effusion, left knee: Secondary | ICD-10-CM | POA: Diagnosis not present

## 2021-01-23 DIAGNOSIS — M25562 Pain in left knee: Secondary | ICD-10-CM | POA: Diagnosis not present

## 2021-01-23 DIAGNOSIS — M1712 Unilateral primary osteoarthritis, left knee: Secondary | ICD-10-CM | POA: Diagnosis not present

## 2021-02-13 ENCOUNTER — Other Ambulatory Visit: Payer: Self-pay | Admitting: Family Medicine

## 2021-02-13 DIAGNOSIS — M109 Gout, unspecified: Secondary | ICD-10-CM

## 2021-02-13 DIAGNOSIS — E7800 Pure hypercholesterolemia, unspecified: Secondary | ICD-10-CM

## 2021-02-13 DIAGNOSIS — I25119 Atherosclerotic heart disease of native coronary artery with unspecified angina pectoris: Secondary | ICD-10-CM

## 2021-02-13 DIAGNOSIS — I10 Essential (primary) hypertension: Secondary | ICD-10-CM

## 2021-02-21 DIAGNOSIS — M1712 Unilateral primary osteoarthritis, left knee: Secondary | ICD-10-CM | POA: Diagnosis not present

## 2021-02-21 DIAGNOSIS — M25562 Pain in left knee: Secondary | ICD-10-CM | POA: Diagnosis not present

## 2021-02-28 ENCOUNTER — Other Ambulatory Visit: Payer: Self-pay | Admitting: *Deleted

## 2021-02-28 ENCOUNTER — Telehealth: Payer: Self-pay | Admitting: *Deleted

## 2021-02-28 MED ORDER — ZOLPIDEM TARTRATE 5 MG PO TABS: 5.0000 mg | ORAL_TABLET | Freq: Every evening | ORAL | 0 refills | Status: DC | PRN

## 2021-02-28 MED ORDER — DEXLANSOPRAZOLE 60 MG PO CPDR: 60.0000 mg | DELAYED_RELEASE_CAPSULE | Freq: Every day | ORAL | 0 refills | Status: DC

## 2021-02-28 NOTE — Telephone Encounter (Signed)
Spoke with patient and let him know about the [place]. He would like to try #30 on the [place] please. I did let him know that it might not be covered, might need PA-he was ok with that.

## 2021-02-28 NOTE — Telephone Encounter (Signed)
Patient called and said he is going [place] later this [place]. He is asking for a one time ambien rx and also something in case he has a gerd attack-he said maybe dexilant? [facility] [place] [place]

## 2021-02-28 NOTE — Telephone Encounter (Signed)
I sent in zolpidem.  Previously sent in 10mg  (years ago).  I can only do 5mg  now, due his age (not supposed to prescribe 10mg ).  If 5mg  isn't effective, he can try taking two, but I cannot write for it that way, and he should try the lower dose first.  As far as the Dexilant--see how many he wants (does he want a full #30? Or less? May not be covered or require prior auth?) Okay to prescribe dexilant for whatever quantity he wants or #30.

## 2021-03-10 ENCOUNTER — Other Ambulatory Visit: Payer: Self-pay | Admitting: Family Medicine

## 2021-03-10 DIAGNOSIS — I25119 Atherosclerotic heart disease of native coronary artery with unspecified angina pectoris: Secondary | ICD-10-CM

## 2021-03-10 DIAGNOSIS — I10 Essential (primary) hypertension: Secondary | ICD-10-CM

## 2021-03-10 DIAGNOSIS — E7800 Pure hypercholesterolemia, unspecified: Secondary | ICD-10-CM

## 2021-03-10 DIAGNOSIS — M109 Gout, unspecified: Secondary | ICD-10-CM

## 2021-03-25 ENCOUNTER — Other Ambulatory Visit: Payer: Self-pay | Admitting: Family Medicine

## 2021-03-31 ENCOUNTER — Other Ambulatory Visit: Payer: Self-pay | Admitting: Family Medicine

## 2021-03-31 DIAGNOSIS — I25119 Atherosclerotic heart disease of native coronary artery with unspecified angina pectoris: Secondary | ICD-10-CM

## 2021-03-31 DIAGNOSIS — E7800 Pure hypercholesterolemia, unspecified: Secondary | ICD-10-CM

## 2021-04-12 DIAGNOSIS — H2513 Age-related nuclear cataract, bilateral: Secondary | ICD-10-CM | POA: Diagnosis not present

## 2021-04-12 DIAGNOSIS — H31092 Other chorioretinal scars, left eye: Secondary | ICD-10-CM | POA: Diagnosis not present

## 2021-04-12 DIAGNOSIS — D23112 Other benign neoplasm of skin of right lower eyelid, including canthus: Secondary | ICD-10-CM | POA: Diagnosis not present

## 2021-04-12 DIAGNOSIS — H43813 Vitreous degeneration, bilateral: Secondary | ICD-10-CM | POA: Diagnosis not present

## 2021-04-13 DIAGNOSIS — M1712 Unilateral primary osteoarthritis, left knee: Secondary | ICD-10-CM | POA: Diagnosis not present

## 2021-05-15 ENCOUNTER — Telehealth: Payer: Self-pay | Admitting: Family Medicine

## 2021-05-15 NOTE — Telephone Encounter (Signed)
Pt wants to know which of the new diet medications do you recommend. He wants to check and see if his insurance covers it ?

## 2021-05-16 ENCOUNTER — Encounter: Payer: Self-pay | Admitting: Family Medicine

## 2021-05-29 DIAGNOSIS — I10 Essential (primary) hypertension: Secondary | ICD-10-CM | POA: Diagnosis not present

## 2021-05-29 DIAGNOSIS — H2512 Age-related nuclear cataract, left eye: Secondary | ICD-10-CM | POA: Diagnosis not present

## 2021-05-29 DIAGNOSIS — H2511 Age-related nuclear cataract, right eye: Secondary | ICD-10-CM | POA: Diagnosis not present

## 2021-05-29 DIAGNOSIS — H60392 Other infective otitis externa, left ear: Secondary | ICD-10-CM | POA: Diagnosis not present

## 2021-05-29 DIAGNOSIS — H6122 Impacted cerumen, left ear: Secondary | ICD-10-CM | POA: Diagnosis not present

## 2021-06-17 NOTE — Patient Instructions (Addendum)
?  HEALTH MAINTENANCE RECOMMENDATIONS: ? ?It is recommended that you get at least 30 minutes of aerobic exercise at least 5 days/week (for weight loss, you may need as much as 60-90 minutes). This can be any activity that gets your heart rate up. This can be divided in 10-15 minute intervals if needed, but try and build up your endurance at least once a week.  Weight bearing exercise is also recommended twice weekly. ? ?Eat a healthy diet with lots of vegetables, fruits and fiber.  "Colorful" foods have a lot of vitamins (ie green vegetables, tomatoes, red peppers, etc).  Limit sweet tea, regular sodas and alcoholic beverages, all of which has a lot of calories and sugar.  Up to 2 alcoholic drinks daily may be beneficial for men (unless trying to lose weight, watch sugars).  Drink a lot of water. ? ?Sunscreen of at least SPF 30 should be used on all sun-exposed parts of the skin when outside between the hours of 10 am and 4 pm (not just when at beach or pool, but even with exercise, golf, tennis, and yard work!)  Use a sunscreen that says "broad spectrum" so it covers both UVA and UVB rays, and make sure to reapply every 1-2 hours. ? ?Remember to change the batteries in your smoke detectors when changing your clock times in the spring and fall.  Carbon monoxide detectors are recommended for your home. ? ?Use your seat belt every time you are in a car, and please drive safely and not be distracted with cell phones and texting while driving. ? ? ? ?Mark Caldwell , ?Thank you for taking time to come for your Medicare Wellness Visit. I appreciate your ongoing commitment to your health goals. Please review the following plan we discussed and let me know if I can assist you in the future.  ? ?This is a list of the screening recommended for you and due dates:  ?Health Maintenance  ?Topic Date Due  ? Colon Cancer Screening  08/15/2019  ? COVID-19 Vaccine (5 - Booster for [place] series) 07/20/2020  ? Flu Shot  09/25/2021  ?  Tetanus Vaccine  06/08/2029  ? Pneumonia Vaccine  Completed  ? Hepatitis C Screening: USPSTF Recommendation to screen - Ages 33-79 yo.  Completed  ? Zoster (Shingles) Vaccine  Completed  ? HPV Vaccine  Aged Out  ? ?Ignore date above for colon cancer screening.  Next screening with Cologuard is due 08/2022. ? ?Go back to the lisinopril '40mg'$  (stop the combo with HCTZ) ?

## 2021-06-17 NOTE — Progress Notes (Signed)
?Chief Complaint  ?Patient presents with  ? Hospitalization Follow-up  ? Medicare Wellness  ?  Nonfasting AWV. Having cataract surgery in [place] in the fall. Had his ears cleaned out in the last month and the PA said he has a pinhole in his left eardrum. Both knees are ok but he has has injections and gel packs-he feels ok and is not in need of surgery as of right now. PSA was 0.2 and saw Dr Diona Fanti this am and he that was amazing. Patient brought in labs from 06/11/21 from [place].  ? Covid Vaccine   ?  Had bivalent booster in Nov-he would like to know if he can have another.   ? Weightloss  ?  Would like to discuss Wegovy.  ? ? ?Mark Caldwell is a 76 y.o. male who presents for annual physical exam, Medicare wellness visit and follow-up on chronic medical conditions.   ?He has the following concerns: ? ?Going to [place] in 2d. Asking about whether or not to get a COVID booster. ? ?05/29/21 otitis externa treated with ofloxacin at UC in [place].  They also did cerumen removal.  He was told there was a "pinhole" in the left eardrum.  No changes to hearing (some decrease in loud environments). Ear pain resolved. Denies any drainage. ? ?Hypertension:  In 10/2020 his lisinopril dose was increased to '40mg'$ , as his home BP's had been running higher than he liked. BP's have remained improved on this dose.  ?BP's at home are 120-130/70-80.P 123/76 at [facility] 06[facility]11/21 ?He denies any headache, cough, chest pain, shortness of breath, edema. ?Admits that he ran out of lisinopril '40mg'$  in the last 2-3 weeks, so is back on leftover combination lisinopril HCT 20/25.  ?  ?BP Readings from Last 3 Encounters:  ?06/18/21 118/70  ?12/11/20 110/68  ?11/15/20 130/74  ? ?Atherosclerosis of aorta and coronary arteries noted on CT in January 2020. He is taking Crestor '10mg'$  daily. He stopped taking the aspirin as recommended.  He underwent stress test with Dr. Marlou Porch in 06/2018 which was low risk.  He denies any chest pain, palpitations, DOE. ?He had labs  done 11/07/20:  Total chol 142, TG 104, LDL 68, HDL 55, chol/HDL ratio 2.6, hs-CRP 2.0 (average risk) ?Today he brings in labs done 06/11/21: ?TC 149, TG 139, LDL 75, HDL 52, chol/HDL ratio 2.9 ?Hs-CRP 2.1  ? ?Fglu 106, Cr 0.73, normal LFT's and rest of chem ?Vitamin D-OH 31 ?Normal CBC ?Normal urine dip ? ?OA in knees: Sees Dr. Rhona Raider locally, and also Dr. Bridgett Larsson in [place]. ?Dr. Bridgett Larsson gave cortisone injection in L knee in 12/2020, and Durolane viscosupplementation in 01/2021 (L knee). ?Previously had gel injections to the R knee as well.   ?Knees are doing pretty well right now. Occasional flare.   ?Saw Dr. Rhona Raider in 03/2021--no records from visit.  He was told that both knees were "bone on bone".  Injections make it tolerable to put off surgery. ?He is taking Tylenol Arthritis regularly (2/day, sometimes 2 at night also). ?Previously prescribed Celebrex by Dr. Rhona Raider, helped some, no longer has any, but not having much pain. Meloxicam wasn't effective. ? ? ?Gout: Patient is currently taking '400mg'$  of allopurinol.  He had tried decreasing it to '300mg'$  (recommended in April 2022, when his HCTZ was stopped), but had recurrent problems in the knee at the lower dose. He denies any recent gout flares (even since recently being back on HCTZ). He continues on '400mg'$ . ?Lab Results  ?Component Value Date  ?  LABURIC 4.6 08/11/2020  ? ?He had uric acid level repeated with labs 10/2020, and level was 5.2.  Not rechecked with recent labs. ?  ?IFG: He limits carbs. joined gym, goes almost daily when he is in [place], but hasn't been there much recently.  At the gym he takes Core classes, water aerobics classes, stationary bikes and weights. ?He previously reported "I've broken up with cannoli's", but now admits he has had some "flings".  Recent [employer] vacation with ice cream. ?Glucose 106 on recent [facility] lab, A1c 5.6% (11/07/20-- A1c was 5.6% and fasting glucose was 107). ?  ?Prostate cancer: he had been under surveillance by Dr. Diona Fanti  for a while, but progressed (biopsy 10/2019) and underwent brachytherapy (radioactive seed implant) in 02/2020.  Last seen by Dr. Diona Fanti in 10/2020, PSA was stable at 0.22, 05/2021 0.2 through [place]. ?Erectile dysfunction: sildenafil isn't as effective, uses along with cock ring. They had discussed injections, but hasn't been in [place] long enough for training.  He is making do. ?  ?30 yo mother is doing well in nursing home, dementia is significant. She recognizes family members and people who see her daily.  She is quite independent. ? ?Obesity:  previously tried Belviq, but was taken off the market. ?He would like to try [facility]. ?  ?Immunization History  ?Administered Date(s) Administered  ? Fluad Quad(high Dose 65+) 11/18/2019, 11/15/2020  ? Hepatitis A 01/27/2006  ? Influenza Split 01/10/2009, 01/03/2011, 01/06/2012  ? Influenza, High Dose Seasonal PF 02/11/2013, 01/31/2014, 02/02/2015, 02/05/2016, 11/23/2016, 11/08/2017, 11/18/2019  ? Influenza,inj,Quad PF,6+ Mos 12/05/2018  ? Influenza-Unspecified 12/05/2018  ? PFIZER Comirnaty(Gray Top)Covid-19 Tri-Sucrose Vaccine 05/25/2020  ? PFIZER(Purple Top)SARS-COV-2 Vaccination 04/07/2019, 04/28/2019, 11/18/2019  ? Pension scheme manager 87yr & up 01/07/2021  ? Pneumococcal Conjugate-13 02/11/2013  ? Pneumococcal Polysaccharide-23 01/31/2014  ? Tdap 02/22/2009, 06/09/2019  ? Zoster Recombinat (Shingrix) 06/25/2016, 11/23/2016  ? Zoster, Live 01/25/2006  ? ?He [place] having more travel vaccines [place]?Last colonoscopy: 2011. Cologuard negative 08/2019.   ?Last PSA: per urologist, 0.22 in 10/2020, 05/2021 at [facility], 0.2 ?Dentist: once or twice yearly ?Ophtho: yearly--saw ophtho in [place] earlier this month. Plans for cataract surgery Fall 2023 ?Derm: sees Dr. GDelman Cheadletwice yearly ?Exercise:  Core classes and water aerobics classes at the gym, stationary bikes and weights. ? ? ?Patient Care Team: ?KRita Ohara MD as PCP - General (Family  Medicine) ?SJerline Pain MD as PCP - Cardiology (Cardiology) ?[facility] Dr. JDonavan Burnetin [place] (for cataracts) ?Urology: Dr. DDiona Fanti?Radiation: Dr. MTammi Klippel?Dermatology: Dr. KJari Pigg?Dentist: Dr. AJetty Duhamel?Ortho: Dr. DRhona Raider and Dr. CBridgett Larssonin [place] ?GI: Dr. GPenelope Coop? ?Depression Screening: ?Flowsheet Row Clinical Support from 11/15/2020 [place] ?PHQ-2 Total Score 0  ? ?  ?  ? ?Falls screen:  ? ?  06/18/2021  ?  2:53 PM 12/11/2020  ? 11:21 AM 11/15/2020  ?  1:17 PM 08/21/2020  ? 10:08 AM 06/15/2020  ?  8:39 AM  ?Fall Risk   ?Falls in the past year? 0 0 0 0   ?Number falls in past yr: 0 0 0 0 0  ?Injury with Fall? 0 0 0 0 0  ?Risk for fall due to : No Fall Risks No Fall Risks No Fall Risks No Fall Risks No Fall Risks  ?Follow up Falls evaluation completed Falls evaluation completed Falls evaluation completed Falls evaluation completed Falls evaluation completed  ?  ? ?  Functional Status Survey: ?Is the patient deaf or have difficulty hearing?: No ?Does the patient have difficulty seeing, even when wearing glasses/contacts?: Yes (B/L cataracts) ?Does the patient have difficulty concentrating, remembering, or making decisions?: No ?Does the patient have difficulty walking or climbing stairs?: No ?Does the patient have difficulty dressing or bathing?: No ?Does the patient have difficulty doing errands alone such as visiting a doctor's office or shopping?: No ? ?Mini-Cog Scoring: 5  ? ?He has a Special educational needs teacher of attorney and living will, scanned into chart. ? ? ? ?PMH, PSH, SH and FH reviewed and updated. ? ?Outpatient Encounter Medications as of 06/18/2021  ?Medication Sig Note  ? allopurinol (ZYLOPRIM) 100 MG tablet Take 1 tablet (100 mg total) by mouth daily.   ? allopurinol (ZYLOPRIM) 300 MG tablet TAKE 1 TABLET BY MOUTH EVERY DAY   ? B Complex-C (SUPER B COMPLEX PO) Take 1 tablet by mouth daily.   ? Cholecalciferol (VITAMIN D) 2000 UNITS tablet Take 2,000 Units by mouth  daily.   ? lisinopril (ZESTRIL) 40 MG tablet TAKE 1 TABLET BY MOUTH EVERY DAY 06/18/2021: Ran out a few weeks ago, taking leftover lisinopril HCT 20/25. Plans to go back to lisinopril '40mg'$   ? rosuvastatin (CRESTOR)

## 2021-06-18 ENCOUNTER — Ambulatory Visit (INDEPENDENT_AMBULATORY_CARE_PROVIDER_SITE_OTHER): Payer: Medicare HMO | Admitting: Family Medicine

## 2021-06-18 ENCOUNTER — Encounter: Payer: Self-pay | Admitting: Family Medicine

## 2021-06-18 VITALS — BP 118/70 | HR 84 | Ht 72.0 in | Wt 237.8 lb

## 2021-06-18 DIAGNOSIS — Z8546 Personal history of malignant neoplasm of prostate: Secondary | ICD-10-CM | POA: Diagnosis not present

## 2021-06-18 DIAGNOSIS — I10 Essential (primary) hypertension: Secondary | ICD-10-CM | POA: Diagnosis not present

## 2021-06-18 DIAGNOSIS — E7800 Pure hypercholesterolemia, unspecified: Secondary | ICD-10-CM | POA: Diagnosis not present

## 2021-06-18 DIAGNOSIS — M109 Gout, unspecified: Secondary | ICD-10-CM | POA: Diagnosis not present

## 2021-06-18 DIAGNOSIS — I25119 Atherosclerotic heart disease of native coronary artery with unspecified angina pectoris: Secondary | ICD-10-CM | POA: Diagnosis not present

## 2021-06-18 DIAGNOSIS — R7301 Impaired fasting glucose: Secondary | ICD-10-CM | POA: Diagnosis not present

## 2021-06-18 DIAGNOSIS — I700 Atherosclerosis of aorta: Secondary | ICD-10-CM | POA: Diagnosis not present

## 2021-06-18 DIAGNOSIS — E6609 Other obesity due to excess calories: Secondary | ICD-10-CM | POA: Diagnosis not present

## 2021-06-18 DIAGNOSIS — C61 Malignant neoplasm of prostate: Secondary | ICD-10-CM | POA: Diagnosis not present

## 2021-06-18 DIAGNOSIS — Z0000 Encounter for general adult medical examination without abnormal findings: Secondary | ICD-10-CM | POA: Diagnosis not present

## 2021-06-18 DIAGNOSIS — I2510 Atherosclerotic heart disease of native coronary artery without angina pectoris: Secondary | ICD-10-CM

## 2021-06-18 DIAGNOSIS — N5239 Other post-surgical erectile dysfunction: Secondary | ICD-10-CM | POA: Diagnosis not present

## 2021-06-18 DIAGNOSIS — Z6832 Body mass index (BMI) 32.0-32.9, adult: Secondary | ICD-10-CM

## 2021-06-18 MED ORDER — LISINOPRIL 40 MG PO TABS: 40.0000 mg | ORAL_TABLET | Freq: Every day | ORAL | 1 refills | Status: DC

## 2021-06-18 MED ORDER — ALLOPURINOL 300 MG PO TABS: 300.0000 mg | ORAL_TABLET | Freq: Every day | ORAL | 1 refills | Status: DC

## 2021-06-18 MED ORDER — ALLOPURINOL 100 MG PO TABS: 100.0000 mg | ORAL_TABLET | Freq: Every day | ORAL | 1 refills | Status: DC

## 2021-06-18 MED ORDER — WEGOVY 0.25 MG/0.5ML ~~LOC~~ SOAJ: 0.2500 mg | SUBCUTANEOUS | 0 refills | Status: DC

## 2021-06-18 MED ORDER — ROSUVASTATIN CALCIUM 10 MG PO TABS: 10.0000 mg | ORAL_TABLET | Freq: Every day | ORAL | 1 refills | Status: DC

## 2021-06-19 ENCOUNTER — Encounter: Payer: Self-pay | Admitting: Family Medicine

## 2021-06-19 DIAGNOSIS — L720 Epidermal cyst: Secondary | ICD-10-CM | POA: Diagnosis not present

## 2021-06-19 DIAGNOSIS — D171 Benign lipomatous neoplasm of skin and subcutaneous tissue of trunk: Secondary | ICD-10-CM | POA: Diagnosis not present

## 2021-06-19 DIAGNOSIS — D225 Melanocytic nevi of trunk: Secondary | ICD-10-CM | POA: Diagnosis not present

## 2021-06-19 DIAGNOSIS — L570 Actinic keratosis: Secondary | ICD-10-CM | POA: Diagnosis not present

## 2021-06-19 DIAGNOSIS — L578 Other skin changes due to chronic exposure to nonionizing radiation: Secondary | ICD-10-CM | POA: Diagnosis not present

## 2021-06-19 DIAGNOSIS — L662 Folliculitis decalvans: Secondary | ICD-10-CM | POA: Diagnosis not present

## 2021-06-19 DIAGNOSIS — L821 Other seborrheic keratosis: Secondary | ICD-10-CM | POA: Diagnosis not present

## 2021-07-07 ENCOUNTER — Telehealth: Payer: Self-pay

## 2021-07-07 NOTE — Telephone Encounter (Signed)
P.A. WEGOVY 

## 2021-07-09 NOTE — Telephone Encounter (Signed)
Sent message to pt

## 2021-07-09 NOTE — Telephone Encounter (Signed)
P.A. Mancel Parsons denied, Medicare Part D all weight loss meds are excluded from coverage. Called pt and informed.  Please let him know your recommendations

## 2021-07-19 DIAGNOSIS — M1711 Unilateral primary osteoarthritis, right knee: Secondary | ICD-10-CM | POA: Diagnosis not present

## 2021-07-19 DIAGNOSIS — M25561 Pain in right knee: Secondary | ICD-10-CM | POA: Diagnosis not present

## 2021-07-19 DIAGNOSIS — M25461 Effusion, right knee: Secondary | ICD-10-CM | POA: Diagnosis not present

## 2021-07-19 DIAGNOSIS — M25562 Pain in left knee: Secondary | ICD-10-CM | POA: Diagnosis not present

## 2021-07-19 DIAGNOSIS — M1712 Unilateral primary osteoarthritis, left knee: Secondary | ICD-10-CM | POA: Diagnosis not present

## 2021-08-14 DIAGNOSIS — R059 Cough, unspecified: Secondary | ICD-10-CM | POA: Diagnosis not present

## 2021-08-14 DIAGNOSIS — K122 Cellulitis and abscess of mouth: Secondary | ICD-10-CM | POA: Diagnosis not present

## 2021-08-14 DIAGNOSIS — J029 Acute pharyngitis, unspecified: Secondary | ICD-10-CM | POA: Diagnosis not present

## 2021-09-20 IMAGING — CR DG CHEST 2V
2 series · 2 of 2 positions shown · non-contrast
Comparison: CT chest dated March 18, 2018.

CLINICAL DATA: Preoperative study.  No chest complaints.

EXAM:
CHEST - 2 VIEW

[w chest pa]
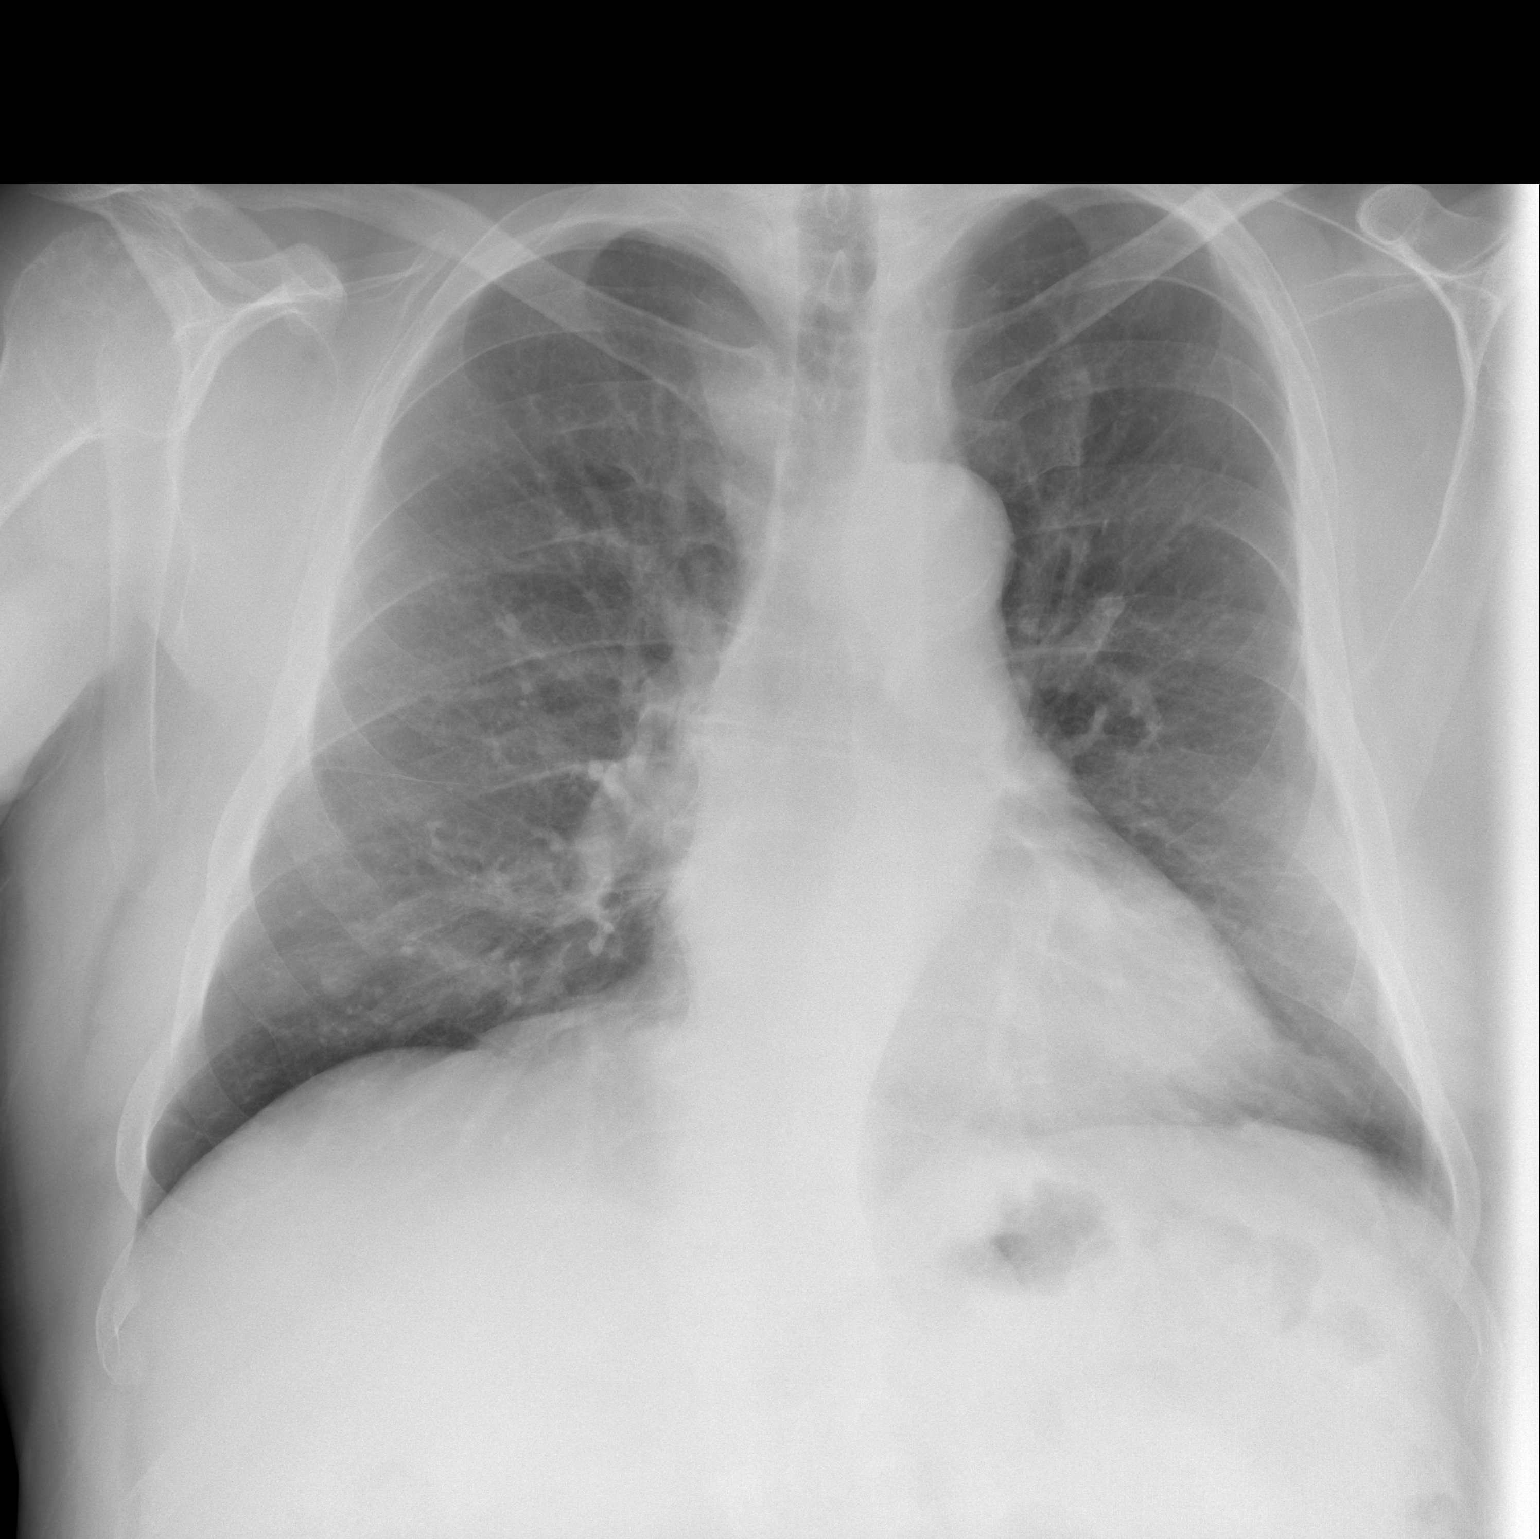

[w chest lat]
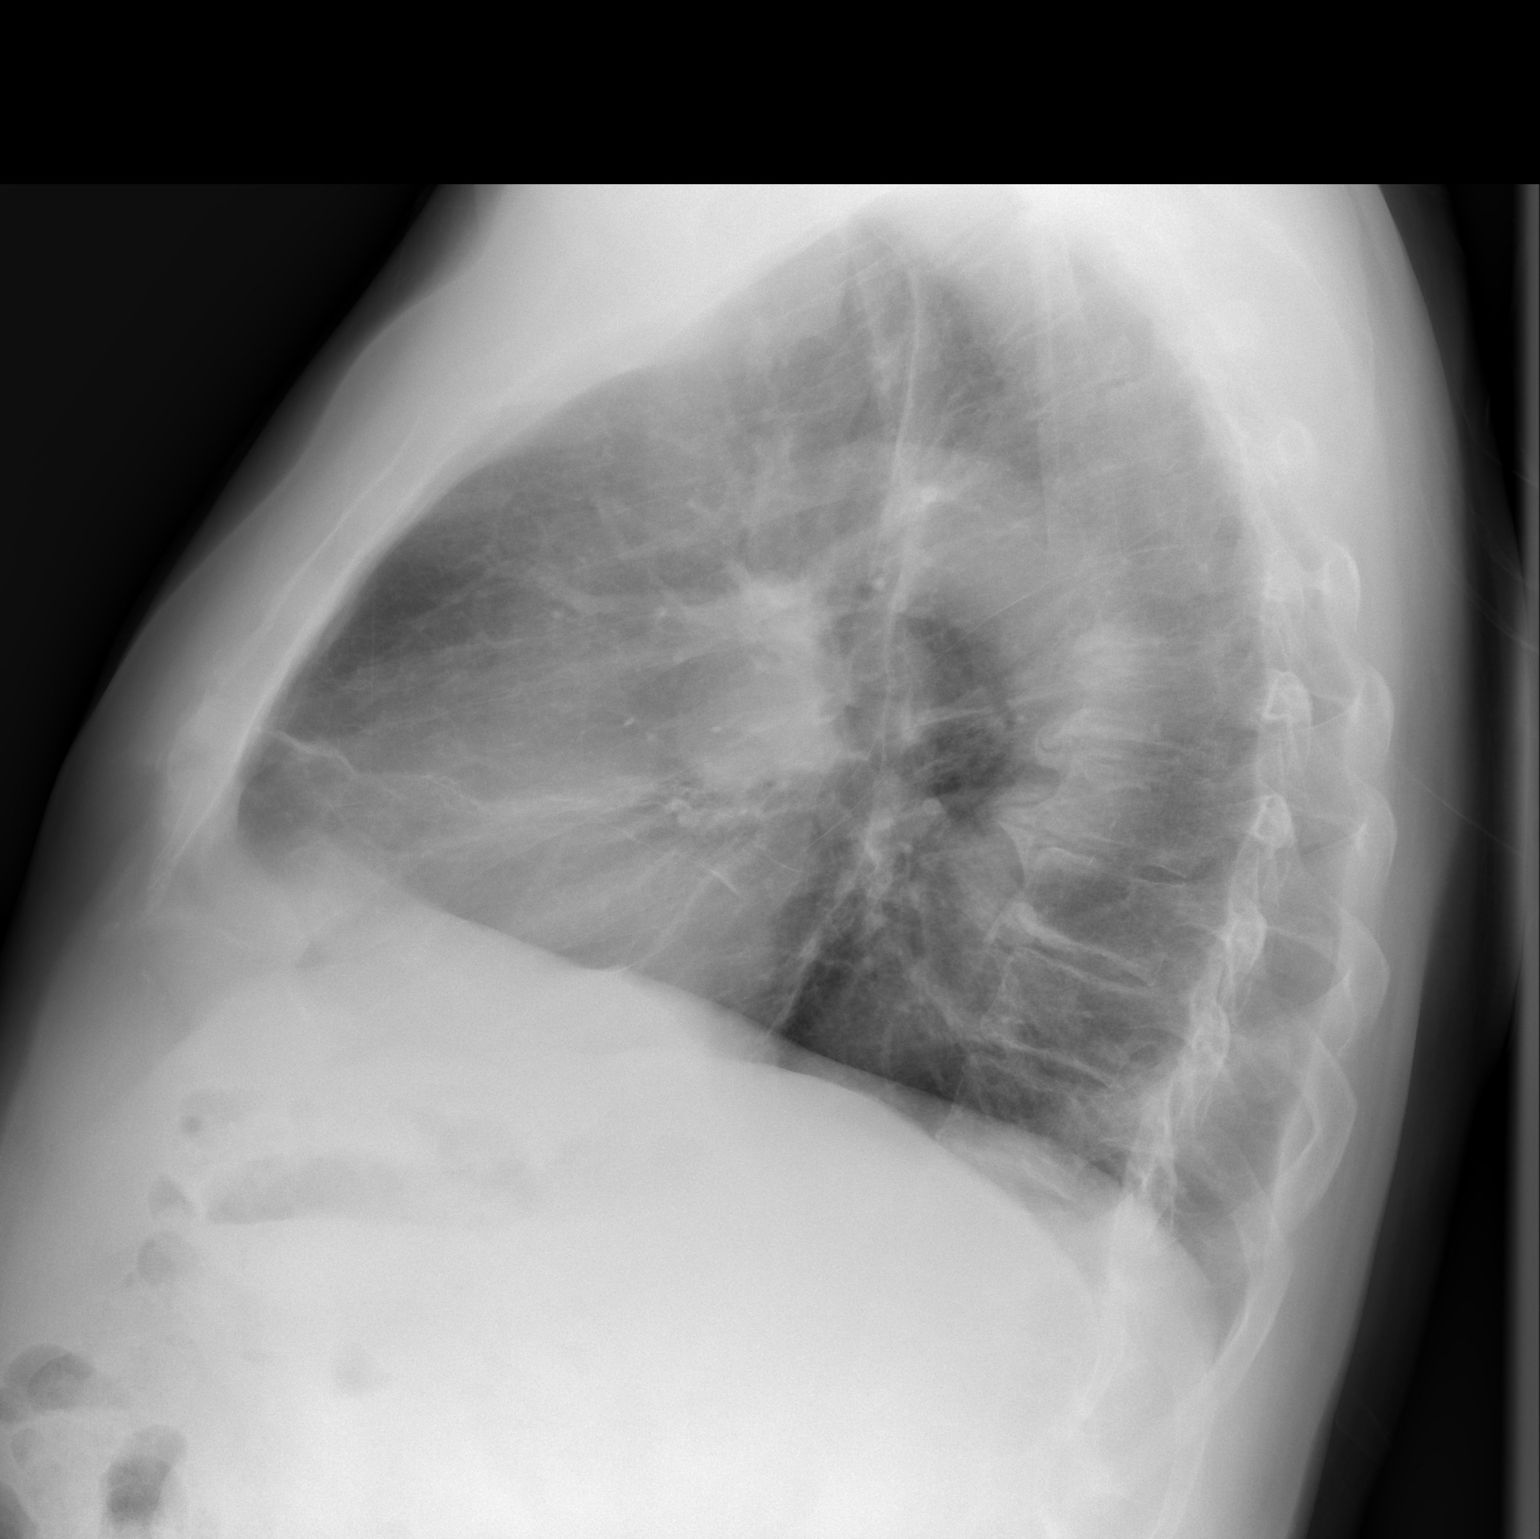

[2 of 2 positions shown; findings below may reference images not displayed]

FINDINGS: The heart size and mediastinal contours are within normal limits.
Normal pulmonary vascularity. No focal consolidation, pleural
effusion, or pneumothorax. Unchanged linear scarring in the lingula.
No acute osseous abnormality.
IMPRESSION: 1. No acute cardiopulmonary disease.

## 2021-09-26 DIAGNOSIS — C61 Malignant neoplasm of prostate: Secondary | ICD-10-CM | POA: Diagnosis not present

## 2021-09-26 DIAGNOSIS — N5201 Erectile dysfunction due to arterial insufficiency: Secondary | ICD-10-CM | POA: Diagnosis not present

## 2021-10-10 DIAGNOSIS — H2589 Other age-related cataract: Secondary | ICD-10-CM | POA: Diagnosis not present

## 2021-10-10 DIAGNOSIS — Z01818 Encounter for other preprocedural examination: Secondary | ICD-10-CM | POA: Diagnosis not present

## 2021-10-11 DIAGNOSIS — N529 Male erectile dysfunction, unspecified: Secondary | ICD-10-CM | POA: Diagnosis not present

## 2021-10-11 DIAGNOSIS — C61 Malignant neoplasm of prostate: Secondary | ICD-10-CM | POA: Diagnosis not present

## 2021-10-11 DIAGNOSIS — N528 Other male erectile dysfunction: Secondary | ICD-10-CM | POA: Diagnosis not present

## 2021-10-31 ENCOUNTER — Encounter: Payer: Self-pay | Admitting: Internal Medicine

## 2021-11-16 DIAGNOSIS — Z01818 Encounter for other preprocedural examination: Secondary | ICD-10-CM | POA: Diagnosis not present

## 2021-11-17 HISTORY — PX: CATARACT EXTRACTION: SUR2

## 2021-11-22 DIAGNOSIS — H2512 Age-related nuclear cataract, left eye: Secondary | ICD-10-CM | POA: Diagnosis not present

## 2021-12-04 ENCOUNTER — Encounter: Payer: Self-pay | Admitting: Internal Medicine

## 2021-12-06 DIAGNOSIS — C61 Malignant neoplasm of prostate: Secondary | ICD-10-CM | POA: Diagnosis not present

## 2021-12-11 DIAGNOSIS — H2511 Age-related nuclear cataract, right eye: Secondary | ICD-10-CM | POA: Diagnosis not present

## 2021-12-14 HISTORY — PX: CATARACT EXTRACTION: SUR2

## 2021-12-20 DIAGNOSIS — H2511 Age-related nuclear cataract, right eye: Secondary | ICD-10-CM | POA: Diagnosis not present

## 2021-12-28 DIAGNOSIS — N529 Male erectile dysfunction, unspecified: Secondary | ICD-10-CM | POA: Diagnosis not present

## 2021-12-28 DIAGNOSIS — C61 Malignant neoplasm of prostate: Secondary | ICD-10-CM | POA: Diagnosis not present

## 2022-01-02 ENCOUNTER — Telehealth: Payer: Self-pay | Admitting: Family Medicine

## 2022-01-02 ENCOUNTER — Telehealth: Payer: Self-pay | Admitting: *Deleted

## 2022-01-02 NOTE — Telephone Encounter (Signed)
[facility]

## 2022-01-02 NOTE — Telephone Encounter (Signed)
It would be Wegovy, but apparently the starting dosages aren't available, and won't be until next year.

## 2022-01-02 NOTE — Telephone Encounter (Signed)
Patient advised.

## 2022-01-02 NOTE — Telephone Encounter (Signed)
Patient has appt on 01/23/22 and does not need this refilled.

## 2022-01-02 NOTE — Telephone Encounter (Signed)
Mark Caldwell wanted to know if there was one of the injectable medications for weightloss that you were recommend if he were going to pay out of pocket (as his medicare will not cover). If you felt like there was one worth paying for that could help him he would schedule a separate visit prior to his med check at the end of the month, please advise.

## 2022-01-08 ENCOUNTER — Other Ambulatory Visit: Payer: Self-pay | Admitting: Family Medicine

## 2022-01-08 DIAGNOSIS — I10 Essential (primary) hypertension: Secondary | ICD-10-CM

## 2022-01-08 DIAGNOSIS — M109 Gout, unspecified: Secondary | ICD-10-CM

## 2022-01-16 DIAGNOSIS — Z131 Encounter for screening for diabetes mellitus: Secondary | ICD-10-CM | POA: Diagnosis not present

## 2022-01-16 DIAGNOSIS — Z8639 Personal history of other endocrine, nutritional and metabolic disease: Secondary | ICD-10-CM | POA: Diagnosis not present

## 2022-01-16 DIAGNOSIS — Z6832 Body mass index (BMI) 32.0-32.9, adult: Secondary | ICD-10-CM | POA: Diagnosis not present

## 2022-01-16 DIAGNOSIS — R7303 Prediabetes: Secondary | ICD-10-CM | POA: Diagnosis not present

## 2022-01-16 DIAGNOSIS — E6609 Other obesity due to excess calories: Secondary | ICD-10-CM | POA: Diagnosis not present

## 2022-01-18 ENCOUNTER — Other Ambulatory Visit: Payer: Self-pay | Admitting: Family Medicine

## 2022-01-18 DIAGNOSIS — E7800 Pure hypercholesterolemia, unspecified: Secondary | ICD-10-CM

## 2022-01-18 DIAGNOSIS — I25119 Atherosclerotic heart disease of native coronary artery with unspecified angina pectoris: Secondary | ICD-10-CM

## 2022-01-22 DIAGNOSIS — L720 Epidermal cyst: Secondary | ICD-10-CM | POA: Diagnosis not present

## 2022-01-22 DIAGNOSIS — L739 Follicular disorder, unspecified: Secondary | ICD-10-CM | POA: Diagnosis not present

## 2022-01-22 DIAGNOSIS — D2230 Melanocytic nevi of unspecified part of face: Secondary | ICD-10-CM | POA: Diagnosis not present

## 2022-01-22 DIAGNOSIS — D171 Benign lipomatous neoplasm of skin and subcutaneous tissue of trunk: Secondary | ICD-10-CM | POA: Diagnosis not present

## 2022-01-22 DIAGNOSIS — D485 Neoplasm of uncertain behavior of skin: Secondary | ICD-10-CM | POA: Diagnosis not present

## 2022-01-22 DIAGNOSIS — L986 Other infiltrative disorders of the skin and subcutaneous tissue: Secondary | ICD-10-CM | POA: Diagnosis not present

## 2022-01-22 DIAGNOSIS — L570 Actinic keratosis: Secondary | ICD-10-CM | POA: Diagnosis not present

## 2022-01-22 DIAGNOSIS — D225 Melanocytic nevi of trunk: Secondary | ICD-10-CM | POA: Diagnosis not present

## 2022-01-22 DIAGNOSIS — L578 Other skin changes due to chronic exposure to nonionizing radiation: Secondary | ICD-10-CM | POA: Diagnosis not present

## 2022-01-22 NOTE — Progress Notes (Unsigned)
No chief complaint on file.  He saw dermatologist yesterday.  Patient was seen at a weight loss clinic in [place] on 01/16/22.  A1c was 5.7%.  He was prescribed semaglutide 0.'25mg'$  weekly injection. (previously tried Belviq, but was taken off the market.)  Hypertension:  He reports compliance with taking lisinopril '40mg'$  daily. BP's at home are   He denies any headache, cough, chest pain, shortness of breath, edema.  BP Readings from Last 3 Encounters:  06/18/21 118/70  12/11/20 110/68  11/15/20 130/74    Atherosclerosis of aorta and coronary arteries noted on CT in January 2020. He is taking Crestor '10mg'$  daily. He had low risk stress test with Dr. Marlou Porch in 06/2018.  He denies any chest pain, palpitations, DOE. Last labs that he brought [place] were from 06/11/21: TC 149, TG 139, LDL 75, HDL 52, chol/HDL ratio 2.9 Hs-CRP 2.1  Fglu 106, Cr 0.73, normal LFT's and rest of chem Vitamin D-OH 31 Normal CBC Normal urine dip    OA in knees: Sees Dr. Rhona Raider locally, and also Dr. Bridgett Larsson in [place]. Hasn't had injections since late 2022 (cortisone, viscosupplementation)   UPDATE  At his physical in the spring he reported that his knees were doing pretty well, with an occasional flare.   Saw Dr. Rhona Raider in 03/2021--no records from visit.  He was told that both knees were "bone on bone".  Injections make it tolerable to put off surgery. He is taking Tylenol Arthritis regularly (2/day, sometimes 2 at night also). Previously prescribed Celebrex by Dr. Rhona Raider, helped some, no longer has any, but not having much pain. Meloxicam wasn't effective.     Gout: Patient is currently taking '400mg'$  of allopurinol.  He had tried decreasing it to '300mg'$  (recommended in April 2022, when his HCTZ was stopped), but had recurrent problems in the knee at the lower dose. He denies any recent gout flares. Lab Results  Component Value Date   LABURIC 4.6 08/11/2020  Level 5.2 elsewhere in 10/2020.     IFG: A1c was done  recently at weight loss clinic, 5.7%. At his physical in April he reported that he limits carbs, was going to the gym almost daily when he is in [place]--core classes, water aerobics classes, stationary bikes and weights. Today he reports    Prostate cancer: he had been under surveillance by Dr. Diona Fanti for a while, but progressed (biopsy 10/2019) and underwent brachytherapy (radioactive seed implant) in 02/2020.  He had a good response to brachytherapy. Last visit with Dr. Diona Fanti was in August 2023.A Erectile dysfunction: sildenafil isn't as effective, uses along with cock ring. They had discussed injections, but hasn't been in [place] long enough for training.  He is making do.   6 yo mother is doing well in nursing home, dementia is significant. She recognizes family members and people who see her daily.  She is quite independent.    PMH, PSH, SH reviewed   ROS: Denies fever, chills, URI symptoms, headaches, dizziness, shortness of breath, chest pain.  Denies nausea, vomiting, bowel changes, urinary complaints, bleeding, bruising, rash. Moods are good. Knee pain per HPI. No gout flares.   PHYSICAL EXAM:  There were no vitals taken for this visit.  Wt Readings from Last 3 Encounters:  06/18/21 237 lb 12.8 oz (107.9 kg)  12/11/20 233 lb 12.8 oz (106.1 kg)  11/15/20 237 lb 12.8 oz (107.9 kg)   Pleasant male in no distress HEENT: conjunctiva and sclera are clear, EOMI Neck: no lymphadenopathy, thyromegaly or mass,  no bruit Heart: regular rate and rhythm Lungs: clear bilaterally Back: no spinal or CVA tenderness Abdomen: soft, nontender Extremities: no edema Psych: normal mood, affect, hygiene and grooming Neuro: alert and oriented, normal strength, gait      ASSESSMENT/PLAN:  Confirm and enter cataract surgeries (?L in 10/2021, R in 11/2021, done in [place]?)  Did he have (and bring) labs done elsewhere? Fasting?  Enter/offer flu shot. COVID infection was in September, so okay to  delay booster a little. Can offer Prevnar 20 also. RSV from pharmacy, if he hasn't already gotten (Verify no vaccines within the last 2 weeks before giving any)  RF crestor

## 2022-01-23 ENCOUNTER — Encounter: Payer: Self-pay | Admitting: Family Medicine

## 2022-01-23 ENCOUNTER — Encounter: Payer: Self-pay | Admitting: *Deleted

## 2022-01-23 ENCOUNTER — Telehealth: Payer: Self-pay | Admitting: *Deleted

## 2022-01-23 ENCOUNTER — Ambulatory Visit (INDEPENDENT_AMBULATORY_CARE_PROVIDER_SITE_OTHER): Payer: Medicare HMO | Admitting: Family Medicine

## 2022-01-23 VITALS — BP 122/72 | HR 68 | Ht 70.0 in | Wt 239.8 lb

## 2022-01-23 DIAGNOSIS — Z23 Encounter for immunization: Secondary | ICD-10-CM

## 2022-01-23 DIAGNOSIS — R7301 Impaired fasting glucose: Secondary | ICD-10-CM | POA: Diagnosis not present

## 2022-01-23 DIAGNOSIS — M109 Gout, unspecified: Secondary | ICD-10-CM | POA: Diagnosis not present

## 2022-01-23 DIAGNOSIS — I10 Essential (primary) hypertension: Secondary | ICD-10-CM

## 2022-01-23 DIAGNOSIS — I700 Atherosclerosis of aorta: Secondary | ICD-10-CM

## 2022-01-23 DIAGNOSIS — Z6834 Body mass index (BMI) 34.0-34.9, adult: Secondary | ICD-10-CM

## 2022-01-23 DIAGNOSIS — N5201 Erectile dysfunction due to arterial insufficiency: Secondary | ICD-10-CM | POA: Diagnosis not present

## 2022-01-23 DIAGNOSIS — E6609 Other obesity due to excess calories: Secondary | ICD-10-CM | POA: Diagnosis not present

## 2022-01-23 DIAGNOSIS — Z8546 Personal history of malignant neoplasm of prostate: Secondary | ICD-10-CM | POA: Diagnosis not present

## 2022-01-23 DIAGNOSIS — E7800 Pure hypercholesterolemia, unspecified: Secondary | ICD-10-CM

## 2022-01-23 DIAGNOSIS — R351 Nocturia: Secondary | ICD-10-CM | POA: Diagnosis not present

## 2022-01-23 DIAGNOSIS — N401 Enlarged prostate with lower urinary tract symptoms: Secondary | ICD-10-CM | POA: Diagnosis not present

## 2022-01-23 DIAGNOSIS — I25119 Atherosclerotic heart disease of native coronary artery with unspecified angina pectoris: Secondary | ICD-10-CM | POA: Diagnosis not present

## 2022-01-23 MED ORDER — ROSUVASTATIN CALCIUM 10 MG PO TABS: 10.0000 mg | ORAL_TABLET | Freq: Every day | ORAL | 1 refills | Status: DC

## 2022-01-23 NOTE — Telephone Encounter (Signed)
I am not comfortable prescribing this compounded semaglutide.

## 2022-01-23 NOTE — Telephone Encounter (Signed)
Patient advised.

## 2022-01-23 NOTE — Telephone Encounter (Signed)
[facility] called and [facility] in [place] in compounding a generic [id number] and they can have it ready for him tomorrow am if you are okay with prescribing this. I called there to get the info in case you were. It can be rx'd in the system and you can put in the notes (2.'5mg'$ /76m semaglutide and '10mg'$ /'1mg'$  pyroxidine) quantity is 286m And you can put whatever dose you want. Their number is [phone]

## 2022-01-23 NOTE — Patient Instructions (Signed)
Try and use your exercise bike 30 minutes daily (can be in shorter intervals, if needed). This should help your knees.

## 2022-01-30 DIAGNOSIS — M1711 Unilateral primary osteoarthritis, right knee: Secondary | ICD-10-CM | POA: Diagnosis not present

## 2022-01-30 DIAGNOSIS — M25561 Pain in right knee: Secondary | ICD-10-CM | POA: Diagnosis not present

## 2022-01-30 DIAGNOSIS — M1712 Unilateral primary osteoarthritis, left knee: Secondary | ICD-10-CM | POA: Diagnosis not present

## 2022-01-30 DIAGNOSIS — M25562 Pain in left knee: Secondary | ICD-10-CM | POA: Diagnosis not present

## 2022-02-07 DIAGNOSIS — U071 COVID-19: Secondary | ICD-10-CM | POA: Diagnosis not present

## 2022-02-08 DIAGNOSIS — Z6832 Body mass index (BMI) 32.0-32.9, adult: Secondary | ICD-10-CM | POA: Diagnosis not present

## 2022-02-08 DIAGNOSIS — E6609 Other obesity due to excess calories: Secondary | ICD-10-CM | POA: Diagnosis not present

## 2022-02-15 DIAGNOSIS — M25562 Pain in left knee: Secondary | ICD-10-CM | POA: Diagnosis not present

## 2022-02-15 DIAGNOSIS — M25561 Pain in right knee: Secondary | ICD-10-CM | POA: Diagnosis not present

## 2022-02-15 DIAGNOSIS — M1711 Unilateral primary osteoarthritis, right knee: Secondary | ICD-10-CM | POA: Diagnosis not present

## 2022-02-15 DIAGNOSIS — M1712 Unilateral primary osteoarthritis, left knee: Secondary | ICD-10-CM | POA: Diagnosis not present

## 2022-02-15 DIAGNOSIS — M170 Bilateral primary osteoarthritis of knee: Secondary | ICD-10-CM | POA: Diagnosis not present

## 2022-03-13 DIAGNOSIS — Z6832 Body mass index (BMI) 32.0-32.9, adult: Secondary | ICD-10-CM | POA: Diagnosis not present

## 2022-03-13 DIAGNOSIS — E6609 Other obesity due to excess calories: Secondary | ICD-10-CM | POA: Diagnosis not present

## 2022-04-01 DIAGNOSIS — H524 Presbyopia: Secondary | ICD-10-CM | POA: Diagnosis not present

## 2022-04-01 DIAGNOSIS — Z961 Presence of intraocular lens: Secondary | ICD-10-CM | POA: Diagnosis not present

## 2022-04-01 DIAGNOSIS — D23112 Other benign neoplasm of skin of right lower eyelid, including canthus: Secondary | ICD-10-CM | POA: Diagnosis not present

## 2022-04-01 DIAGNOSIS — H52223 Regular astigmatism, bilateral: Secondary | ICD-10-CM | POA: Diagnosis not present

## 2022-04-01 DIAGNOSIS — H31092 Other chorioretinal scars, left eye: Secondary | ICD-10-CM | POA: Diagnosis not present

## 2022-04-01 DIAGNOSIS — H26493 Other secondary cataract, bilateral: Secondary | ICD-10-CM | POA: Diagnosis not present

## 2022-04-02 DIAGNOSIS — D485 Neoplasm of uncertain behavior of skin: Secondary | ICD-10-CM | POA: Diagnosis not present

## 2022-04-05 ENCOUNTER — Other Ambulatory Visit: Payer: Self-pay | Admitting: Family Medicine

## 2022-04-05 DIAGNOSIS — M109 Gout, unspecified: Secondary | ICD-10-CM

## 2022-04-12 DIAGNOSIS — M1712 Unilateral primary osteoarthritis, left knee: Secondary | ICD-10-CM | POA: Diagnosis not present

## 2022-04-12 DIAGNOSIS — M25561 Pain in right knee: Secondary | ICD-10-CM | POA: Diagnosis not present

## 2022-04-12 DIAGNOSIS — M170 Bilateral primary osteoarthritis of knee: Secondary | ICD-10-CM | POA: Diagnosis not present

## 2022-04-12 DIAGNOSIS — M25562 Pain in left knee: Secondary | ICD-10-CM | POA: Diagnosis not present

## 2022-04-12 DIAGNOSIS — M1711 Unilateral primary osteoarthritis, right knee: Secondary | ICD-10-CM | POA: Diagnosis not present

## 2022-05-01 DIAGNOSIS — E6609 Other obesity due to excess calories: Secondary | ICD-10-CM | POA: Diagnosis not present

## 2022-05-01 DIAGNOSIS — Z6832 Body mass index (BMI) 32.0-32.9, adult: Secondary | ICD-10-CM | POA: Diagnosis not present

## 2022-05-01 DIAGNOSIS — Z23 Encounter for immunization: Secondary | ICD-10-CM | POA: Diagnosis not present

## 2022-05-03 ENCOUNTER — Telehealth: Payer: Self-pay | Admitting: Family Medicine

## 2022-05-03 MED ORDER — ZOLPIDEM TARTRATE 5 MG PO TABS: 5.0000 mg | ORAL_TABLET | Freq: Every evening | ORAL | 0 refills | Status: DC | PRN

## 2022-05-03 NOTE — Telephone Encounter (Signed)
Chart reviewed. Uses infrequently with travel. Last Rx 02/2021, #20.

## 2022-05-03 NOTE — Telephone Encounter (Signed)
Pt left message needs refill [facility] [place]

## 2022-06-07 DIAGNOSIS — Z6832 Body mass index (BMI) 32.0-32.9, adult: Secondary | ICD-10-CM | POA: Diagnosis not present

## 2022-06-07 DIAGNOSIS — E6609 Other obesity due to excess calories: Secondary | ICD-10-CM | POA: Diagnosis not present

## 2022-06-07 DIAGNOSIS — R7303 Prediabetes: Secondary | ICD-10-CM | POA: Diagnosis not present

## 2022-06-07 DIAGNOSIS — R7301 Impaired fasting glucose: Secondary | ICD-10-CM | POA: Diagnosis not present

## 2022-06-07 DIAGNOSIS — K311 Adult hypertrophic pyloric stenosis: Secondary | ICD-10-CM | POA: Diagnosis not present

## 2022-06-14 ENCOUNTER — Other Ambulatory Visit: Payer: Self-pay | Admitting: Family Medicine

## 2022-06-14 DIAGNOSIS — I10 Essential (primary) hypertension: Secondary | ICD-10-CM

## 2022-06-14 NOTE — Telephone Encounter (Signed)
Pt is low and needs a refill

## 2022-06-21 ENCOUNTER — Other Ambulatory Visit: Payer: Self-pay | Admitting: Family Medicine

## 2022-06-21 DIAGNOSIS — M109 Gout, unspecified: Secondary | ICD-10-CM

## 2022-06-30 NOTE — Patient Instructions (Signed)
  HEALTH MAINTENANCE RECOMMENDATIONS:  It is recommended that you get at least 30 minutes of aerobic exercise at least 5 days/week (for weight loss, you may need as much as 60-90 minutes). This can be any activity that gets your heart rate up. This can be divided in 10-15 minute intervals if needed, but try and build up your endurance at least once a week.  Weight bearing exercise is also recommended twice weekly.  Eat a healthy diet with lots of vegetables, fruits and fiber.  "Colorful" foods have a lot of vitamins (ie green vegetables, tomatoes, red peppers, etc).  Limit sweet tea, regular sodas and alcoholic beverages, all of which has a lot of calories and sugar.  Up to 2 alcoholic drinks daily may be beneficial for men (unless trying to lose weight, watch sugars).  Drink a lot of water.  Sunscreen of at least SPF 30 should be used on all sun-exposed parts of the skin when outside between the hours of 10 am and 4 pm (not just when at beach or pool, but even with exercise, golf, tennis, and yard work!)  Use a sunscreen that says "broad spectrum" so it covers both UVA and UVB rays, and make sure to reapply every 1-2 hours.  Remember to change the batteries in your smoke detectors when changing your clock times in the spring and fall.  Carbon monoxide detectors are recommended for your home.  Use your seat belt every time you are in a car, and please drive safely and not be distracted with cell phones and texting while driving.    Mark Caldwell , Thank you for taking time to come for your Medicare Wellness Visit. I appreciate your ongoing commitment to your health goals. Please review the following plan we discussed and let me know if I can assist you in the future.   This is a list of the screening recommended for you and due dates:  Health Maintenance  Topic Date Due   Colon Cancer Screening  08/15/2019   COVID-19 Vaccine (7 - 2023-24 season) 06/26/2022   Flu Shot  09/26/2022   Medicare  Annual Wellness Visit  07/01/2023   DTaP/Tdap/Td vaccine (3 - Td or Tdap) 06/08/2029   Pneumonia Vaccine  Completed   Hepatitis C Screening: USPSTF Recommendation to screen - Ages 64-79 yo.  Completed   Zoster (Shingles) Vaccine  Completed   HPV Vaccine  Aged Out   Cologuard is due in July for colon cancer screening (ignore date above). Pay attention to the news in the Fall--not sure if RSV vaccine will be every other year, or yearly. And listen for any updates regarding COVID vaccines.  Currently you are up to date.

## 2022-06-30 NOTE — Progress Notes (Unsigned)
No chief complaint on file.   Mark Caldwell is a 77 y.o. male who presents for annual physical exam, Medicare wellness visit and follow-up on chronic medical conditions.   He went to [place] and [place] in March.  He has the following concerns:  Obesity:  patient has been seeing Dr. Garnette Gunner in [place], and is currently on Ozempic 2mg  weekly.    Hypertension:  He reports compliance with taking lisinopril 40mg  daily. BP's at home are  He denies any headache, cough, chest pain, shortness of breath, edema.   BP Readings from Last 3 Encounters:  01/23/22 122/72  06/18/21 118/70  12/11/20 110/68     Atherosclerosis of aorta and coronary arteries noted on CT in January 2020. He is taking Crestor 10mg  daily. He had low risk stress test with Dr. Anne Fu in 06/2018.  He denies any chest pain, palpitations, DOE.  Tries to follow lowfat, low cholesterol diet.  Denies side effects of statin.  No longer takes CoQ10, didn't seem to do much.  UPDATE Last labs that he brought [place] were from 06/11/21: TC 149, TG 139, LDL 75, HDL 52, chol/HDL ratio 2.9 Hs-CRP 2.1  Fglu 106, Cr 0.73, normal LFT's and rest of chem Vitamin D-OH 31 Normal CBC Normal urine dip     OA in knees: Sees Dr. Jerl Santos locally, and also Dr. Imogene Burn in [place]. He is s/p cortisone shots and viscosupplementation. Last injections with Dr. Imogene Burn were 03/2022 (bilateral durolane viscosupplementation).  He walks 1/2 mile to the beach, twice, but can't walk for much longer distances.  He has been told that both knees were "bone on bone".  Injections make it tolerable to put off surgery. He is taking Tylenol Arthritis regularly, 2 pills twice daily. Knee pain wakes him up in the middle of the night,takes 2 at night, and 2 at noon.  UPDATE  (?better since wt loss??)    Gout: Patient is currently taking 400mg  of allopurinol.  He had tried decreasing it to 300mg  (recommended in April 2022, when his HCTZ was stopped), but had recurrent  problems in the knee at the lower dose. He denies any recent gout flares. Lab Results  Component Value Date   LABURIC 4.6 08/11/2020     IFG: A1c was done 12/2021 at weight loss clinic, 5.7%. Limits carbs Exercise  UPDATE-core classes, water aerobics classes, stationary bikes and weights.  Has an exercise bike, doesn't use it.   Prostate cancer: he had been under surveillance by Dr. Retta Diones for a while, but progressed (biopsy 10/2019) and underwent brachytherapy (radioactive seed implant) in 02/2020.  He had a good response to brachytherapy.  Due for f/u with Dr. Retta Diones now. Last PSA was in 12/2021 (results not available to [place]), due again now.  Prior had been good at 0.2 in 05/2021.  Erectile dysfunction: managed by urologist; sildenafil and Trimix. BPH: remains on tamsulosin per urologist.     Almost 72 yo mother is doing well in nursing home, dementia is significant. She recognizes family members and people who see her daily.  She remains quite independent. Mobile, pain-free.     Immunization History  Administered Date(s) Administered   COVID-19, mRNA, vaccine(Comirnaty)12 years and older 05/01/2022   Fluad Quad(high Dose 65+) 11/18/2019, 11/15/2020, 12/05/2021   Hepatitis A 01/27/2006   Influenza Split 01/10/2009, 01/03/2011, 01/06/2012   Influenza, High Dose Seasonal PF 02/11/2013, 01/31/2014, 02/02/2015, 02/05/2016, 11/23/2016, 11/08/2017, 11/18/2019   Influenza,inj,Quad PF,6+ Mos 12/05/2018   Influenza-Unspecified 12/05/2018   PFIZER Comirnaty(Gray Top)Covid-19 Tri-Sucrose  Vaccine 05/25/2020   PFIZER(Purple Top)SARS-COV-2 Vaccination 04/07/2019, 04/28/2019, 11/18/2019   PNEUMOCOCCAL CONJUGATE-20 01/23/2022   Pfizer Covid-19 Vaccine Bivalent Booster 19yrs & up 01/07/2021   Pneumococcal Conjugate-13 02/11/2013   Pneumococcal Polysaccharide-23 01/31/2014   Rsv, Bivalent, Protein Subunit Rsvpref,pf Verdis Frederickson) 12/05/2021   Tdap 02/22/2009, 06/09/2019   Zoster Recombinat  (Shingrix) 06/25/2016, 11/23/2016   Zoster, Live 01/25/2006   He reported having more travel vaccines prior to going to [place] Last colonoscopy: 2011. Cologuard negative 08/2019.   Last PSA: per urologist, in 12/2021, due again now Dentist: once or twice yearly Ophtho: yearly (had bilateral caratact surgery fall 2023) Derm: sees Dr. Emily Filbert twice yearly Exercise:   Core classes and water aerobics classes at the gym, stationary bikes and weights.   Patient Care Team: Joselyn Arrow, MD as PCP - General (Family Medicine) Jake Bathe, MD as PCP - Cardiology (Cardiology) Op[facility]ho: [facility] Jimmey Ralph at [facility]; Dr. Durenda Age in [place] (for cataracts) Urology: Dr. Retta Diones Radiation: Dr. Kathrynn Running Dermatology: Dr. Elmon Else Dentist: Dr. Marthe Patch: Dr. Jerl Santos, and Dr. Imogene Burn in [place] GI: Dr. Evette Cristal Dr. Garnette Gunner (weight loss in [place])  Depression Screening: Flowsheet Row Clinical Support from 11/15/2020 in [facility]  PHQ-2 Total Score 0        Falls screen:     06/18/2021    2:53 PM 12/11/2020   11:21 AM 11/15/2020    1:17 PM 08/21/2020   10:08 AM 06/15/2020    8:39 AM  Fall Risk   Falls in the past year? 0 0 0 0   Number falls in past yr: 0 0 0 0 0  Injury with Fall? 0 0 0 0 0  Risk for fall due to : No Fall Risks No Fall Risks No Fall Risks No Fall Risks No Fall Risks  Follow up Falls evaluation completed Falls evaluation completed Falls evaluation completed Falls evaluation completed Falls evaluation completed     Functional Status Survey:        He has a Healthcare power of attorney and living will, scanned into chart.    PMH, PSH, SH and FH reviewed and updated.    ROS: The patient denies anorexia, fever, headaches, decreased hearing, ear pain, hoarseness, chest pain, palpitations, dizziness, syncope, dyspnea on exertion, cough, swelling, nausea, vomiting, diarrhea, constipation, abdominal pain, melena, hematochezia, hematuria, incontinence, nocturia, dysuria,  genital lesions, numbness, tingling, weakness, tremor, depression, anxiety, abnormal bleeding/bruising, or enlarged lymph nodes    Vision improved after cataract surgery Knee pain is improved. Denies any urinary complaints.  ED  He denies snoring, unrefreshed sleep or daytime somnolence.   PHYSICAL EXAM:  There were no vitals taken for this visit.  Wt Readings from Last 3 Encounters:  01/23/22 239 lb 12.8 oz (108.8 kg)  06/18/21 237 lb 12.8 oz (107.9 kg)  12/11/20 233 lb 12.8 oz (106.1 kg)     General Appearance:    Alert, cooperative, no distress, appears stated age     Head:    Normocephalic, without obvious abnormality, atraumatic     Eyes:    PERRL, conjunctiva/corneas clear, EOM's intact, fundi benign 1.72mm flesh colored mole on right lower eye lid, in center, unchanged  Ears:    Some scarring noted at L TM, normal EAC. R--normal TM and EAC.    Nose:    Normal, no abnormal drainage or sinus tenderness  Throat:    Normal, no lesions.  Neck:    Supple, no lymphadenopathy; thyroid: no enlargement/tenderness/ nodules; no carotid bruit or  JVD     Back:    Spine nontender, no curvature, ROM normal, no CVA tenderness   Lungs:    Clear to auscultation bilaterally without wheezes, rales or ronchi; respirations unlabored     Chest Wall:    No tenderness or deformity     Heart:    Regular rate and rhythm, S1 and S2 normal, no murmur, rub or gallop     Breast Exam:    No chest wall tenderness, masses or gynecomastia     Abdomen:    Soft, non-tender, nondistended, normoactive bowel sounds, no masses, no hepatosplenomegaly. +abdominal obesity     Genitalia:    Deferred to urology  Rectal:    Deferred to urology     Extremities:    No clubbing, cyanosis or edema     Pulses:    2+ and symmetric all extremities     Skin:    Skin color, texture, turgor normal, no rashes or lesions.   Lymph nodes:    Cervical, supraclavicular nodes normal     Neurologic:    Normal strength, sensation and  gait; reflexes 2+ and symmetric throughout                         Psych:   Normal mood, affect, hygiene and grooming  UPDATE ***mole R lower eye, scarred L TM  ASSESSMENT/PLAN:  HAPPY BELATED BIRTHDAY 5/5  For some reason his cologuard isn't shutting off the colon cancer screening measure and not showing up in health maintenance Did he bring any lab results? (Usually does panel in [place])  Order Cologuard (for 08/2022)  If he didn't bring labs-- C-met, cbc, lipid, uric acid. ?A1c if not done by Dr. Garnette Gunner since 12/2021  RF--allopurinol 100mg , crestor (300mg  and lisinopril RF x90d in mid-April)  Recommended at least 30 minutes of aerobic activity at least 5 days/week (150 min), weight-bearing exercise 2x/wk; proper sunscreen use reviewed; healthy diet and alcohol recommendations (less than or equal to 2 drinks/day) reviewed; regular seatbelt use; changing batteries in smoke detectors.  Immunization recommendations discussed--up to date. Continue yearly high dose flu shots.  Colon cancer screening --Cologuard due 08/2022.    MOST form completed. Full Code, Full Care.  F/u 6 months for med check   Medicare Attestation I have personally reviewed: The patient's medical and social history Their use of alcohol, tobacco or illicit drugs Their current medications and supplements The patient's functional ability including ADLs,fall risks, home safety risks, cognitive, and hearing and visual impairment Diet and physical activities Evidence for depression or mood disorders  The patient's weight, height, BMI have been recorded in the chart.  I have made referrals, counseling, and provided education to the patient based on review of the above and I have provided the patient with a written personalized care plan for preventive services.

## 2022-07-01 ENCOUNTER — Encounter: Payer: Self-pay | Admitting: Family Medicine

## 2022-07-01 ENCOUNTER — Ambulatory Visit (INDEPENDENT_AMBULATORY_CARE_PROVIDER_SITE_OTHER): Payer: Medicare HMO | Admitting: Family Medicine

## 2022-07-01 VITALS — BP 110/60 | HR 68 | Ht 70.0 in | Wt 232.4 lb

## 2022-07-01 DIAGNOSIS — I25119 Atherosclerotic heart disease of native coronary artery with unspecified angina pectoris: Secondary | ICD-10-CM

## 2022-07-01 DIAGNOSIS — E7800 Pure hypercholesterolemia, unspecified: Secondary | ICD-10-CM | POA: Diagnosis not present

## 2022-07-01 DIAGNOSIS — I10 Essential (primary) hypertension: Secondary | ICD-10-CM

## 2022-07-01 DIAGNOSIS — Z1211 Encounter for screening for malignant neoplasm of colon: Secondary | ICD-10-CM | POA: Diagnosis not present

## 2022-07-01 DIAGNOSIS — M170 Bilateral primary osteoarthritis of knee: Secondary | ICD-10-CM | POA: Diagnosis not present

## 2022-07-01 DIAGNOSIS — Z5181 Encounter for therapeutic drug level monitoring: Secondary | ICD-10-CM | POA: Diagnosis not present

## 2022-07-01 DIAGNOSIS — Z8546 Personal history of malignant neoplasm of prostate: Secondary | ICD-10-CM | POA: Diagnosis not present

## 2022-07-01 DIAGNOSIS — M109 Gout, unspecified: Secondary | ICD-10-CM | POA: Diagnosis not present

## 2022-07-01 DIAGNOSIS — N529 Male erectile dysfunction, unspecified: Secondary | ICD-10-CM

## 2022-07-01 DIAGNOSIS — R7301 Impaired fasting glucose: Secondary | ICD-10-CM | POA: Diagnosis not present

## 2022-07-01 DIAGNOSIS — I2510 Atherosclerotic heart disease of native coronary artery without angina pectoris: Secondary | ICD-10-CM

## 2022-07-01 DIAGNOSIS — I700 Atherosclerosis of aorta: Secondary | ICD-10-CM

## 2022-07-01 DIAGNOSIS — Z0000 Encounter for general adult medical examination without abnormal findings: Secondary | ICD-10-CM | POA: Diagnosis not present

## 2022-07-01 LAB — POCT URINALYSIS DIP (PROADVANTAGE DEVICE)
Bilirubin, UA: NEGATIVE
Blood, UA: NEGATIVE
Glucose, UA: NEGATIVE mg/dL
Ketones, POC UA: NEGATIVE mg/dL
Leukocytes, UA: NEGATIVE
Nitrite, UA: NEGATIVE
Protein Ur, POC: NEGATIVE mg/dL
Specific Gravity, Urine: 1.02
Urobilinogen, Ur: 0.2
pH, UA: 6 (ref 5.0–8.0)

## 2022-07-01 LAB — POCT GLYCOSYLATED HEMOGLOBIN (HGB A1C): Hemoglobin A1C: 5.4 % (ref 4.0–5.6)

## 2022-07-01 MED ORDER — ALLOPURINOL 100 MG PO TABS: 100.0000 mg | ORAL_TABLET | Freq: Every day | ORAL | 3 refills | Status: DC

## 2022-07-02 LAB — COMPREHENSIVE METABOLIC PANEL
ALT: 19 IU/L (ref 0–44)
AST: 15 IU/L (ref 0–40)
Albumin/Globulin Ratio: 2 (ref 1.2–2.2)
Albumin: 4.5 g/dL (ref 3.8–4.8)
Alkaline Phosphatase: 86 IU/L (ref 44–121)
BUN/Creatinine Ratio: 20 (ref 10–24)
BUN: 17 mg/dL (ref 8–27)
Bilirubin Total: 0.4 mg/dL (ref 0.0–1.2)
CO2: 21 mmol/L (ref 20–29)
Calcium: 9.3 mg/dL (ref 8.6–10.2)
Chloride: 105 mmol/L (ref 96–106)
Creatinine, Ser: 0.85 mg/dL (ref 0.76–1.27)
Globulin, Total: 2.2 g/dL (ref 1.5–4.5)
Glucose: 100 mg/dL — ABNORMAL HIGH (ref 70–99)
Potassium: 4.6 mmol/L (ref 3.5–5.2)
Sodium: 139 mmol/L (ref 134–144)
Total Protein: 6.7 g/dL (ref 6.0–8.5)
eGFR: 91 mL/min/{1.73_m2} (ref >59)

## 2022-07-02 LAB — CBC WITH DIFFERENTIAL/PLATELET
Basophils Absolute: 0.1 10*3/uL (ref 0.0–0.2)
Basos: 1 %
EOS (ABSOLUTE): 0.2 10*3/uL (ref 0.0–0.4)
Eos: 4 %
Hematocrit: 45.3 % (ref 37.5–51.0)
Hemoglobin: 14.9 g/dL (ref 13.0–17.7)
Immature Grans (Abs): 0.1 10*3/uL (ref 0.0–0.1)
Immature Granulocytes: 1 %
Lymphocytes Absolute: 1.5 10*3/uL (ref 0.7–3.1)
Lymphs: 24 %
MCH: 30.9 pg (ref 26.6–33.0)
MCHC: 32.9 g/dL (ref 31.5–35.7)
MCV: 94 fL (ref 79–97)
Monocytes Absolute: 0.6 10*3/uL (ref 0.1–0.9)
Monocytes: 9 %
Neutrophils Absolute: 3.9 10*3/uL (ref 1.4–7.0)
Neutrophils: 61 %
Platelets: 276 10*3/uL (ref 150–450)
RBC: 4.82 x10E6/uL (ref 4.14–5.80)
RDW: 13.8 % (ref 11.6–15.4)
WBC: 6.3 10*3/uL (ref 3.4–10.8)

## 2022-07-02 LAB — INSULIN, RANDOM: INSULIN: 54.9 u[IU]/mL — ABNORMAL HIGH (ref 2.6–24.9)

## 2022-07-02 LAB — LIPID PANEL
Chol/HDL Ratio: 2.3 ratio (ref 0.0–5.0)
Cholesterol, Total: 119 mg/dL (ref 100–199)
HDL: 52 mg/dL (ref >39)
LDL Chol Calc (NIH): 50 mg/dL (ref 0–99)
Triglycerides: 91 mg/dL (ref 0–149)
VLDL Cholesterol Cal: 17 mg/dL (ref 5–40)

## 2022-07-02 LAB — PSA: Prostate Specific Ag, Serum: 0.5 ng/mL (ref 0.0–4.0)

## 2022-07-02 LAB — URIC ACID: Uric Acid: 4 mg/dL (ref 3.8–8.4)

## 2022-07-03 DIAGNOSIS — D485 Neoplasm of uncertain behavior of skin: Secondary | ICD-10-CM | POA: Diagnosis not present

## 2022-07-03 DIAGNOSIS — R351 Nocturia: Secondary | ICD-10-CM | POA: Diagnosis not present

## 2022-07-03 DIAGNOSIS — L821 Other seborrheic keratosis: Secondary | ICD-10-CM | POA: Diagnosis not present

## 2022-07-03 DIAGNOSIS — D171 Benign lipomatous neoplasm of skin and subcutaneous tissue of trunk: Secondary | ICD-10-CM | POA: Diagnosis not present

## 2022-07-03 DIAGNOSIS — L570 Actinic keratosis: Secondary | ICD-10-CM | POA: Diagnosis not present

## 2022-07-03 DIAGNOSIS — L739 Follicular disorder, unspecified: Secondary | ICD-10-CM | POA: Diagnosis not present

## 2022-07-03 DIAGNOSIS — D225 Melanocytic nevi of trunk: Secondary | ICD-10-CM | POA: Diagnosis not present

## 2022-07-03 DIAGNOSIS — N401 Enlarged prostate with lower urinary tract symptoms: Secondary | ICD-10-CM | POA: Diagnosis not present

## 2022-07-03 DIAGNOSIS — L578 Other skin changes due to chronic exposure to nonionizing radiation: Secondary | ICD-10-CM | POA: Diagnosis not present

## 2022-07-03 DIAGNOSIS — L720 Epidermal cyst: Secondary | ICD-10-CM | POA: Diagnosis not present

## 2022-07-03 DIAGNOSIS — N5201 Erectile dysfunction due to arterial insufficiency: Secondary | ICD-10-CM | POA: Diagnosis not present

## 2022-07-07 ENCOUNTER — Other Ambulatory Visit: Payer: Self-pay | Admitting: Family Medicine

## 2022-07-07 DIAGNOSIS — I10 Essential (primary) hypertension: Secondary | ICD-10-CM

## 2022-07-07 DIAGNOSIS — E7800 Pure hypercholesterolemia, unspecified: Secondary | ICD-10-CM

## 2022-07-07 DIAGNOSIS — I25119 Atherosclerotic heart disease of native coronary artery with unspecified angina pectoris: Secondary | ICD-10-CM

## 2022-09-09 DIAGNOSIS — Z1211 Encounter for screening for malignant neoplasm of colon: Secondary | ICD-10-CM | POA: Diagnosis not present

## 2022-09-15 LAB — COLOGUARD: COLOGUARD: NEGATIVE

## 2022-09-17 ENCOUNTER — Other Ambulatory Visit: Payer: Self-pay | Admitting: Family Medicine

## 2022-09-17 DIAGNOSIS — M109 Gout, unspecified: Secondary | ICD-10-CM

## 2022-10-09 ENCOUNTER — Other Ambulatory Visit: Payer: Self-pay | Admitting: Family Medicine

## 2022-10-09 DIAGNOSIS — E7800 Pure hypercholesterolemia, unspecified: Secondary | ICD-10-CM

## 2022-10-09 DIAGNOSIS — I25119 Atherosclerotic heart disease of native coronary artery with unspecified angina pectoris: Secondary | ICD-10-CM

## 2022-10-17 DIAGNOSIS — M25562 Pain in left knee: Secondary | ICD-10-CM | POA: Diagnosis not present

## 2022-10-17 DIAGNOSIS — M1711 Unilateral primary osteoarthritis, right knee: Secondary | ICD-10-CM | POA: Diagnosis not present

## 2022-10-17 DIAGNOSIS — M170 Bilateral primary osteoarthritis of knee: Secondary | ICD-10-CM | POA: Diagnosis not present

## 2022-10-17 DIAGNOSIS — M25561 Pain in right knee: Secondary | ICD-10-CM | POA: Diagnosis not present

## 2022-10-17 DIAGNOSIS — M1712 Unilateral primary osteoarthritis, left knee: Secondary | ICD-10-CM | POA: Diagnosis not present

## 2022-11-06 DIAGNOSIS — R058 Other specified cough: Secondary | ICD-10-CM | POA: Diagnosis not present

## 2022-11-06 DIAGNOSIS — R062 Wheezing: Secondary | ICD-10-CM | POA: Diagnosis not present

## 2022-11-06 DIAGNOSIS — R0981 Nasal congestion: Secondary | ICD-10-CM | POA: Diagnosis not present

## 2022-11-06 DIAGNOSIS — R0982 Postnasal drip: Secondary | ICD-10-CM | POA: Diagnosis not present

## 2022-12-13 DIAGNOSIS — I2510 Atherosclerotic heart disease of native coronary artery without angina pectoris: Secondary | ICD-10-CM | POA: Diagnosis not present

## 2022-12-13 DIAGNOSIS — E66811 Obesity, class 1: Secondary | ICD-10-CM | POA: Diagnosis not present

## 2022-12-13 DIAGNOSIS — Z6832 Body mass index (BMI) 32.0-32.9, adult: Secondary | ICD-10-CM | POA: Diagnosis not present

## 2022-12-13 DIAGNOSIS — R7303 Prediabetes: Secondary | ICD-10-CM | POA: Diagnosis not present

## 2022-12-13 DIAGNOSIS — E6609 Other obesity due to excess calories: Secondary | ICD-10-CM | POA: Diagnosis not present

## 2022-12-16 DIAGNOSIS — N529 Male erectile dysfunction, unspecified: Secondary | ICD-10-CM | POA: Diagnosis not present

## 2022-12-16 DIAGNOSIS — R972 Elevated prostate specific antigen [PSA]: Secondary | ICD-10-CM | POA: Diagnosis not present

## 2022-12-18 ENCOUNTER — Other Ambulatory Visit: Payer: Self-pay | Admitting: Family Medicine

## 2022-12-18 DIAGNOSIS — M109 Gout, unspecified: Secondary | ICD-10-CM

## 2023-01-02 DIAGNOSIS — M25561 Pain in right knee: Secondary | ICD-10-CM | POA: Diagnosis not present

## 2023-01-02 DIAGNOSIS — M1711 Unilateral primary osteoarthritis, right knee: Secondary | ICD-10-CM | POA: Diagnosis not present

## 2023-01-02 DIAGNOSIS — M25562 Pain in left knee: Secondary | ICD-10-CM | POA: Diagnosis not present

## 2023-01-02 DIAGNOSIS — M13861 Other specified arthritis, right knee: Secondary | ICD-10-CM | POA: Diagnosis not present

## 2023-01-02 DIAGNOSIS — M1712 Unilateral primary osteoarthritis, left knee: Secondary | ICD-10-CM | POA: Diagnosis not present

## 2023-01-12 DIAGNOSIS — H5789 Other specified disorders of eye and adnexa: Secondary | ICD-10-CM | POA: Diagnosis not present

## 2023-01-12 DIAGNOSIS — H00015 Hordeolum externum left lower eyelid: Secondary | ICD-10-CM | POA: Diagnosis not present

## 2023-01-12 DIAGNOSIS — I10 Essential (primary) hypertension: Secondary | ICD-10-CM | POA: Diagnosis not present

## 2023-01-29 DIAGNOSIS — C44719 Basal cell carcinoma of skin of left lower limb, including hip: Secondary | ICD-10-CM | POA: Diagnosis not present

## 2023-02-05 DIAGNOSIS — H5202 Hypermetropia, left eye: Secondary | ICD-10-CM | POA: Diagnosis not present

## 2023-02-07 DIAGNOSIS — L821 Other seborrheic keratosis: Secondary | ICD-10-CM | POA: Diagnosis not present

## 2023-02-07 DIAGNOSIS — D229 Melanocytic nevi, unspecified: Secondary | ICD-10-CM | POA: Diagnosis not present

## 2023-02-13 DIAGNOSIS — M542 Cervicalgia: Secondary | ICD-10-CM | POA: Diagnosis not present

## 2023-02-13 DIAGNOSIS — M40202 Unspecified kyphosis, cervical region: Secondary | ICD-10-CM | POA: Diagnosis not present

## 2023-02-25 ENCOUNTER — Other Ambulatory Visit: Payer: Self-pay | Admitting: Family Medicine

## 2023-02-25 DIAGNOSIS — I10 Essential (primary) hypertension: Secondary | ICD-10-CM

## 2023-02-25 DIAGNOSIS — M542 Cervicalgia: Secondary | ICD-10-CM | POA: Diagnosis not present

## 2023-02-25 NOTE — Telephone Encounter (Signed)
 Pt has an appt in end of May

## 2023-02-27 DIAGNOSIS — M542 Cervicalgia: Secondary | ICD-10-CM | POA: Diagnosis not present

## 2023-03-31 DIAGNOSIS — M25561 Pain in right knee: Secondary | ICD-10-CM | POA: Diagnosis not present

## 2023-03-31 DIAGNOSIS — M25562 Pain in left knee: Secondary | ICD-10-CM | POA: Diagnosis not present

## 2023-03-31 DIAGNOSIS — M1712 Unilateral primary osteoarthritis, left knee: Secondary | ICD-10-CM | POA: Diagnosis not present

## 2023-03-31 DIAGNOSIS — M1711 Unilateral primary osteoarthritis, right knee: Secondary | ICD-10-CM | POA: Diagnosis not present

## 2023-04-15 DIAGNOSIS — S300XXA Contusion of lower back and pelvis, initial encounter: Secondary | ICD-10-CM | POA: Diagnosis not present

## 2023-04-15 DIAGNOSIS — L723 Sebaceous cyst: Secondary | ICD-10-CM | POA: Diagnosis not present

## 2023-04-15 DIAGNOSIS — L089 Local infection of the skin and subcutaneous tissue, unspecified: Secondary | ICD-10-CM | POA: Diagnosis not present

## 2023-04-17 DIAGNOSIS — M40202 Unspecified kyphosis, cervical region: Secondary | ICD-10-CM | POA: Diagnosis not present

## 2023-04-17 DIAGNOSIS — M542 Cervicalgia: Secondary | ICD-10-CM | POA: Diagnosis not present

## 2023-04-18 DIAGNOSIS — M1711 Unilateral primary osteoarthritis, right knee: Secondary | ICD-10-CM | POA: Diagnosis not present

## 2023-04-18 DIAGNOSIS — M1712 Unilateral primary osteoarthritis, left knee: Secondary | ICD-10-CM | POA: Diagnosis not present

## 2023-04-18 DIAGNOSIS — M25561 Pain in right knee: Secondary | ICD-10-CM | POA: Diagnosis not present

## 2023-04-18 DIAGNOSIS — M25562 Pain in left knee: Secondary | ICD-10-CM | POA: Diagnosis not present

## 2023-04-29 DIAGNOSIS — M47812 Spondylosis without myelopathy or radiculopathy, cervical region: Secondary | ICD-10-CM | POA: Diagnosis not present

## 2023-04-29 DIAGNOSIS — M4802 Spinal stenosis, cervical region: Secondary | ICD-10-CM | POA: Diagnosis not present

## 2023-05-05 DIAGNOSIS — L0291 Cutaneous abscess, unspecified: Secondary | ICD-10-CM | POA: Diagnosis not present

## 2023-05-05 DIAGNOSIS — I10 Essential (primary) hypertension: Secondary | ICD-10-CM | POA: Diagnosis not present

## 2023-05-05 DIAGNOSIS — L02212 Cutaneous abscess of back [any part, except buttock]: Secondary | ICD-10-CM | POA: Diagnosis not present

## 2023-05-06 DIAGNOSIS — L02414 Cutaneous abscess of left upper limb: Secondary | ICD-10-CM | POA: Diagnosis not present

## 2023-05-24 ENCOUNTER — Other Ambulatory Visit: Payer: Self-pay | Admitting: Family Medicine

## 2023-05-24 DIAGNOSIS — E7800 Pure hypercholesterolemia, unspecified: Secondary | ICD-10-CM

## 2023-05-24 DIAGNOSIS — I25119 Atherosclerotic heart disease of native coronary artery with unspecified angina pectoris: Secondary | ICD-10-CM

## 2023-07-01 NOTE — Patient Instructions (Incomplete)
  HEALTH MAINTENANCE RECOMMENDATIONS:  It is recommended that you get at least 30 minutes of aerobic exercise at least 5 days/week (for weight loss, you may need as much as 60-90 minutes). This can be any activity that gets your heart rate up. This can be divided in 10-15 minute intervals if needed, but try and build up your endurance at least once a week.  Weight bearing exercise is also recommended twice weekly.  Eat a healthy diet with lots of vegetables, fruits and fiber.  "Colorful" foods have a lot of vitamins (ie green vegetables, tomatoes, red peppers, etc).  Limit sweet tea, regular sodas and alcoholic beverages, all of which has a lot of calories and sugar.  Up to 2 alcoholic drinks daily may be beneficial for men (unless trying to lose weight, watch sugars).  Drink a lot of water .  Sunscreen of at least SPF 30 should be used on all sun-exposed parts of the skin when outside between the hours of 10 am and 4 pm (not just when at beach or pool, but even with exercise, golf, tennis, and yard work!)  Use a sunscreen that says "broad spectrum" so it covers both UVA and UVB rays, and make sure to reapply every 1-2 hours.  Remember to change the batteries in your smoke detectors when changing your clock times in the spring and fall.  Carbon monoxide detectors are recommended for your home.  Use your seat belt every time you are in a car, and please drive safely and not be distracted with cell phones and texting while driving.    Mr. Senna , Thank you for taking time to come for your Medicare Wellness Visit. I appreciate your ongoing commitment to your health goals. Please review the following plan we discussed and let me know if I can assist you in the future.   This is a list of the screening recommended for you and due dates:  Health Maintenance  Topic Date Due   COVID-19 Vaccine (9 - Pfizer risk 2024-25 season) 07/16/2023*   Flu Shot  09/26/2023   Medicare Annual Wellness Visit   07/01/2024   DTaP/Tdap/Td vaccine (3 - Td or Tdap) 06/08/2029   Pneumonia Vaccine  Completed   Hepatitis C Screening  Completed   Zoster (Shingles) Vaccine  Completed   HPV Vaccine  Aged Out   Meningitis B Vaccine  Aged Out   Colon Cancer Screening  Discontinued   Cologuard (Stool DNA test)  Discontinued  *Topic was postponed. The date shown is not the original due date.   You are eligible to get another COVID booster now, if desired. I recommend getting the updated one (when available) in the Fall, along with your flu shot.

## 2023-07-01 NOTE — Progress Notes (Unsigned)
 No chief complaint on file.   Mark Caldwell is a 78 y.o. male who presents for annual physical exam, Medicare wellness visit and follow-up on chronic medical conditions.   Last seen 1 year ago. He has seen multiple providers over the year, in [address] and here.  01/2023 he had BCC removed from L thigh (Dr. Joanne Muckle).  Sebaceous cyst LUE was I&D'd in 04/2023.  He has been dealing with neck pain since 01/2023.  He had MRI in 01/2023 showing degenerative changes, with moderate central canal stenosis at C5-C6. He saw ortho-spine in 03/2023, who did not recommend surgery. He went to pain clinic 04/2023. They recommended PT, and can consider medial branch nerve block and RF ablation.  Obesity:  Last year he had been on Ozempic 2 mg through Dr. Marvell Slider in [place]. He had reported getting down to 227# prior to his last physical (had been 246# prior to Thanksgiving prior to this).  He reported some constipation. He had been busy with travel. He was planning to joint gym in [place].  UPDATE ***  Wt Readings from Last 3 Encounters:  07/01/22 232 lb 6.4 oz (105.4 kg)  01/23/22 239 lb 12.8 oz (108.8 kg)  06/18/21 237 lb 12.8 oz (107.9 kg)     Hypertension:  He reports compliance with taking lisinopril  40mg  daily. BP's at home are ***  He denies any headache, dizziness, cough, chest pain, shortness of breath, edema.   BP Readings from Last 3 Encounters:  07/01/22 110/60  01/23/22 122/72  06/18/21 118/70   Atherosclerosis of aorta and coronary arteries noted on CT in January 2020. He is taking Crestor  10mg  daily. He had low risk stress test with Dr. Renna Cary in 06/2018.  He denies any chest pain, palpitations, DOE. Tries to follow lowfat, low cholesterol diet.  Denies side effects of statin.  No longer takes CoQ10, didn't seem to do much. Admits to a big birthday celebration this past weekend. ***  Lipids were at goal on last check, due for recheck today. Lab Results  Component Value Date   CHOL 119  07/01/2022   HDL 52 07/01/2022   LDLCALC 50 07/01/2022   TRIG 91 07/01/2022   CHOLHDL 2.3 07/01/2022     OA in knees: Sees Dr. Dalldorf locally, and also Dr. Farrel Hones in [place]. He is s/p cortisone shots and viscosupplementation.   Last year-- Last injections with Dr. Farrel Hones were 03/2022 (bilateral durolane viscosupplementation). Knees are good now.  He felt discomfort at the end of the day when getting >12K steps, manageable with Tylenol .  Unable to do some of the hiking he used to be able to do when traveling.  Hesitant to get knee replacements (doesn't like being dependent on others).  He has been told that both knees were "bone on bone".  Injections make it tolerable to put off surgery. He is taking Tylenol  Arthritis regularly, 2 pills twice daily with travel, just as needed otherwise.    Gout: Patient is currently taking 400mg  of allopurinol .  He had tried decreasing it to 300mg  (recommended in April 2022, when his HCTZ was stopped), but had recurrent problems in the knee at the lower dose. He denies any recent gout flares. Due for uric acid level today Lab Results  Component Value Date   LABURIC 4.0 07/01/2022     IFG: A1c was done 12/2021 at weight loss clinic, 5.7%. It was normal at 5.4% at his physical last year. Admits that pasta is his weakness, not sweets. Trying to  limit portions.  Trying to not have wine daily with dinner, has 4 days/week (which is cutting back). Has an exercise bike, using it 2x/week, for 15-30 minutes. Does some dumbbell weights he uses at home. Gym? ***UPDATE ALL    Prostate cancer: he had been under surveillance by Dr. Joie Narrow for a while, but progressed (biopsy 10/2019) and underwent brachytherapy (radioactive seed implant) in 02/2020.  He had a good response to brachytherapy. He last saw Dr. Dulcy Gibney in 01/2023.  PSA was repeated at that time (results not available); PSA was 0.2 in 09/2022 in [place] (per Dr. Rollie Clipper notes).  Plan is once yearly  checks.  Erectile dysfunction: managed by urologist, alternates sildenafil  and Trimix with good results.  BPH: Dr. Dulcy Gibney suggested at 01/2023 visit that he could try stopping tamsulosin.  ***UPDATE Last year reported that 90% of the time his urine flow is fine.  Occasional urgency or nocturia (rare).   Almost 69?? yo mother is doing well in nursing home, dementia is significant, stable. She recognizes family members and people who see her daily.  She remains quite independent. Mobile, pain-free.     Immunization History  Administered Date(s) Administered   Fluad Quad(high Dose 65+) 11/18/2019, 11/15/2020, 12/05/2021   Hepatitis A 01/27/2006   Influenza Split 01/10/2009, 01/03/2011, 01/06/2012   Influenza, High Dose Seasonal PF 02/11/2013, 01/31/2014, 02/02/2015, 02/05/2016, 11/23/2016, 11/08/2017, 11/18/2019, 12/04/2022   Influenza,inj,Quad PF,6+ Mos 12/05/2018   Influenza-Unspecified 12/05/2018   Moderna Covid-19 Fall Seasonal Vaccine 23yrs & older 12/04/2022   PFIZER Comirnaty(Gray Top)Covid-19 Tri-Sucrose Vaccine 05/25/2020   PFIZER(Purple Top)SARS-COV-2 Vaccination 04/07/2019, 04/27/2019, 04/28/2019, 11/18/2019   PNEUMOCOCCAL CONJUGATE-20 01/23/2022   Pfizer Covid-19 Vaccine Bivalent Booster 29yrs & up 01/07/2021   Pfizer(Comirnaty)Fall Seasonal Vaccine 12 years and older 05/01/2022   Pneumococcal Conjugate-13 02/11/2013   Pneumococcal Polysaccharide-23 01/31/2014   Rsv, Bivalent, Protein Subunit Rsvpref,pf Pattricia Bores) 12/05/2021   Tdap 02/22/2009, 06/09/2019   Zoster Recombinant(Shingrix) 06/25/2016, 11/23/2016   Zoster, Live 01/25/2006   He reported having more travel vaccines prior to going to [place] Last colonoscopy: 2011. Cologuard negative 08/2019, 08/2022   Last PSA: per urologist, in 01/2023 Dentist: once or twice yearly Ophtho: yearly (had bilateral caratact surgery fall 2023) Derm: sees Dr. Joanne Muckle twice yearly Exercise:    Stationary bike 2x/week, dumbbells.   Plans to rejoin gym in [place] (weights only, no access to pool at this gym).   Patient Care Team: Roosvelt Colla, MD as PCP - General (Family Medicine) Hugh Madura, MD as PCP - Cardiology (Cardiology) Daneil Dunker Lytle Saunders, DO (Osteopathic Medicine) Trent Frizzle, MD as Consulting Physician (Urology) Thais Fill, MD as Consulting Physician (Dermatology) Dayne Even, MD as Consulting Physician (Orthopedic Surgery) Gastroenterology, Cherene Core Ophtho:  Dr. Fernando Hoyer in [place] (for cataracts) Urology: Dr. Dulcy Gibney (Dr. Jeffrey Mini, urologist in [place]) Radiation: Dr. Lorri Rota Dermatology: Dr. Thais Fill Dentist: Dr. Geanie Keen ([place]) Ortho: Dr. Ana Balling, and Dr. Farrel Hones in [place] GI: Dr. Elsie Halo Dr. Marvell Slider (weight loss in [place])  Depression Screening: Flowsheet Row Office Visit from 07/01/2022 in [facility]  PHQ-2 Total Score 0        Falls screen:     07/01/2022    1:15 PM 06/18/2021    2:53 PM 12/11/2020   11:21 AM 11/15/2020    1:17 PM 08/21/2020   10:08 AM  Fall Risk   Falls in the past year? 0 0 0 0 0  Number falls in past yr: 0 0 0 0 0  Injury with Fall? 0 0 0 0 0  Risk for fall due to : No Fall Risks No Fall Risks No Fall Risks No Fall Risks No Fall Risks  Follow up Falls evaluation completed Falls evaluation completed Falls evaluation completed Falls evaluation completed Falls evaluation completed     Functional Status Survey:        He has a Healthcare power of attorney and living will, scanned into chart.   PMH, PSH, SH and FH reviewed and updated.    ROS: The patient denies fever, headaches, decreased hearing, ear pain, hoarseness, chest pain, palpitations, dizziness, syncope, dyspnea on exertion, cough, swelling, nausea, vomiting, diarrhea, constipation, abdominal pain, melena, hematochezia, hematuria, incontinence, nocturia, dysuria, genital lesions, numbness, tingling, weakness, tremor, depression, anxiety, abnormal bleeding/bruising, or enlarged lymph nodes    Vision  improved after cataract surgery; doesn't like driving at night still. Knee pain is stable. Denies any urinary complaints.  ED is well controlled He denies snoring, unrefreshed sleep or daytime somnolence.  Wt? Constipation? Neck pain? Numbness, tingling, weakness?  ***UPDATE   PHYSICAL EXAM:  There were no vitals taken for this visit.  Wt Readings from Last 3 Encounters:  07/01/22 232 lb 6.4 oz (105.4 kg)  01/23/22 239 lb 12.8 oz (108.8 kg)  06/18/21 237 lb 12.8 oz (107.9 kg)     General Appearance:    Alert, cooperative, no distress, appears stated age     Head:    Normocephalic, without obvious abnormality, atraumatic     Eyes:    PERRL, conjunctiva/corneas clear, EOM's intact, fundi benign 1.5-10mm flesh colored mole on right lower eye lid, in center, unchanged ***  Ears:    Some mild scarring noted at L TM, normal EAC. R--normal TM and EAC.    Nose:    Normal, no abnormal drainage or sinus tenderness  Throat:    Normal, no lesions.  Neck:    Supple, no lymphadenopathy; thyroid: no enlargement/tenderness/ nodules; no carotid bruit or JVD     Back:    Spine nontender, no curvature, ROM normal, no CVA tenderness   Lungs:    Clear to auscultation bilaterally without wheezes, rales or ronchi; respirations unlabored     Chest Wall:    No tenderness or deformity     Heart:    Regular rate and rhythm, S1 and S2 normal, no murmur, rub or gallop     Breast Exam:    No chest wall tenderness, masses or gynecomastia     Abdomen:    Soft, non-tender, nondistended, normoactive bowel sounds, no masses, no hepatosplenomegaly. +abdominal obesity. WHSS  Genitalia:    Deferred to urology  Rectal:    Deferred to urology     Extremities:    No clubbing, cyanosis or edema     Pulses:    2+ and symmetric all extremities     Skin:    Skin color, texture, turgor normal, no rashes or lesions.  Lymph nodes:    Cervical, supraclavicular nodes normal     Neurologic:    Normal strength, sensation and  gait; reflexes 2+ and symmetric throughout                         Psych:   Normal mood, affect, hygiene and grooming  UPDATE *** scarring L TM, mole R lower eyelid Scar L posterior arm? [facility]   ASSESSMENT/PLAN:  Per Dr. Dulcy Gibney ([facility]) note 01/2023, they were going to be checking PSA then (we didn't get result), and then once yearly.  Shouldn't  need PSA today.  Mom still living??? (Almost 89?)  Consider COVID booster from pharmacy.   Recommended at least 30 minutes of aerobic activity at least 5 days/week (150 min), weight-bearing exercise 2x/wk; proper sunscreen use reviewed; healthy diet and alcohol recommendations (less than or equal to 2 drinks/day) reviewed; regular seatbelt use; changing batteries in smoke detectors.  Immunization recommendations discussed--up to date. Continue yearly high dose flu shots and COVID boosters. Discussed rec for >65 is q6 mos, eligible now (from pharmacy, not available in office today). Colon cancer screening --UTD; Cologuard due 08/2025.   MOST form completed. Full Code, Full Care.  F/u 6 months for med check   Medicare Attestation I have personally reviewed: The patient's medical and social history Their use of alcohol, tobacco or illicit drugs Their current medications and supplements The patient's functional ability including ADLs,fall risks, home safety risks, cognitive, and hearing and visual impairment Diet and physical activities Evidence for depression or mood disorders  The patient's weight, height, BMI have been recorded in the chart.  I have made referrals, counseling, and provided education to the patient based on review of the above and I have provided the patient with a written personalized care plan for preventive services.

## 2023-07-02 ENCOUNTER — Encounter: Payer: Self-pay | Admitting: Family Medicine

## 2023-07-02 ENCOUNTER — Ambulatory Visit: Payer: Medicare HMO | Admitting: Family Medicine

## 2023-07-02 VITALS — BP 132/80 | HR 76 | Ht 71.0 in | Wt 222.4 lb

## 2023-07-02 DIAGNOSIS — L578 Other skin changes due to chronic exposure to nonionizing radiation: Secondary | ICD-10-CM | POA: Diagnosis not present

## 2023-07-02 DIAGNOSIS — E7800 Pure hypercholesterolemia, unspecified: Secondary | ICD-10-CM | POA: Diagnosis not present

## 2023-07-02 DIAGNOSIS — M109 Gout, unspecified: Secondary | ICD-10-CM

## 2023-07-02 DIAGNOSIS — L570 Actinic keratosis: Secondary | ICD-10-CM | POA: Diagnosis not present

## 2023-07-02 DIAGNOSIS — Z0000 Encounter for general adult medical examination without abnormal findings: Secondary | ICD-10-CM | POA: Diagnosis not present

## 2023-07-02 DIAGNOSIS — I10 Essential (primary) hypertension: Secondary | ICD-10-CM | POA: Diagnosis not present

## 2023-07-02 DIAGNOSIS — N529 Male erectile dysfunction, unspecified: Secondary | ICD-10-CM | POA: Diagnosis not present

## 2023-07-02 DIAGNOSIS — Z5181 Encounter for therapeutic drug level monitoring: Secondary | ICD-10-CM

## 2023-07-02 DIAGNOSIS — I2510 Atherosclerotic heart disease of native coronary artery without angina pectoris: Secondary | ICD-10-CM

## 2023-07-02 DIAGNOSIS — Z8546 Personal history of malignant neoplasm of prostate: Secondary | ICD-10-CM | POA: Diagnosis not present

## 2023-07-02 DIAGNOSIS — D171 Benign lipomatous neoplasm of skin and subcutaneous tissue of trunk: Secondary | ICD-10-CM | POA: Diagnosis not present

## 2023-07-02 DIAGNOSIS — I700 Atherosclerosis of aorta: Secondary | ICD-10-CM

## 2023-07-02 DIAGNOSIS — L720 Epidermal cyst: Secondary | ICD-10-CM | POA: Diagnosis not present

## 2023-07-02 DIAGNOSIS — L821 Other seborrheic keratosis: Secondary | ICD-10-CM | POA: Diagnosis not present

## 2023-07-02 DIAGNOSIS — R7301 Impaired fasting glucose: Secondary | ICD-10-CM

## 2023-07-02 DIAGNOSIS — D2230 Melanocytic nevi of unspecified part of face: Secondary | ICD-10-CM | POA: Diagnosis not present

## 2023-07-02 DIAGNOSIS — D225 Melanocytic nevi of trunk: Secondary | ICD-10-CM | POA: Diagnosis not present

## 2023-07-02 LAB — POCT GLYCOSYLATED HEMOGLOBIN (HGB A1C): Hemoglobin A1C: 5.4 % (ref 4.0–5.6)

## 2023-07-02 LAB — LIPID PANEL

## 2023-07-03 LAB — URIC ACID: Uric Acid: 3.9 mg/dL (ref 3.8–8.4)

## 2023-07-03 LAB — LIPID PANEL
Cholesterol, Total: 151 mg/dL (ref 100–199)
HDL: 56 mg/dL (ref >39)
LDL CALC COMMENT:: 2.7 ratio (ref 0.0–5.0)
LDL Chol Calc (NIH): 72 mg/dL (ref 0–99)
Triglycerides: 135 mg/dL (ref 0–149)
VLDL Cholesterol Cal: 23 mg/dL (ref 5–40)

## 2023-07-03 LAB — CMP14+EGFR
ALT: 20 IU/L (ref 0–44)
AST: 21 IU/L (ref 0–40)
Albumin: 5 g/dL — ABNORMAL HIGH (ref 3.8–4.8)
Alkaline Phosphatase: 87 IU/L (ref 44–121)
BUN/Creatinine Ratio: 16 (ref 10–24)
BUN: 12 mg/dL (ref 8–27)
Bilirubin Total: 0.5 mg/dL (ref 0.0–1.2)
CO2: 17 mmol/L — ABNORMAL LOW (ref 20–29)
Calcium: 10.3 mg/dL — ABNORMAL HIGH (ref 8.6–10.2)
Chloride: 103 mmol/L (ref 96–106)
Creatinine, Ser: 0.76 mg/dL (ref 0.76–1.27)
Globulin, Total: 2.3 g/dL (ref 1.5–4.5)
Glucose: 90 mg/dL (ref 70–99)
Potassium: 4.6 mmol/L (ref 3.5–5.2)
Sodium: 142 mmol/L (ref 134–144)
Total Protein: 7.3 g/dL (ref 6.0–8.5)
eGFR: 93 mL/min/{1.73_m2} (ref >59)

## 2023-07-03 LAB — CBC WITH DIFFERENTIAL/PLATELET
Basophils Absolute: 0.1 10*3/uL (ref 0.0–0.2)
Basos: 1 %
EOS (ABSOLUTE): 0.2 10*3/uL (ref 0.0–0.4)
Eos: 3 %
Hematocrit: 48.5 % (ref 37.5–51.0)
Hemoglobin: 16 g/dL (ref 13.0–17.7)
Immature Grans (Abs): 0 10*3/uL (ref 0.0–0.1)
Immature Granulocytes: 1 %
Lymphocytes Absolute: 1.9 10*3/uL (ref 0.7–3.1)
Lymphs: 26 %
MCH: 31.4 pg (ref 26.6–33.0)
MCHC: 33 g/dL (ref 31.5–35.7)
MCV: 95 fL (ref 79–97)
Monocytes Absolute: 0.7 10*3/uL (ref 0.1–0.9)
Monocytes: 10 %
Neutrophils Absolute: 4.4 10*3/uL (ref 1.4–7.0)
Neutrophils: 59 %
Platelets: 314 10*3/uL (ref 150–450)
RBC: 5.09 x10E6/uL (ref 4.14–5.80)
RDW: 13.2 % (ref 11.6–15.4)
WBC: 7.2 10*3/uL (ref 3.4–10.8)

## 2023-07-03 LAB — PSA: Prostate Specific Ag, Serum: 0.3 ng/mL (ref 0.0–4.0)

## 2023-07-06 ENCOUNTER — Encounter: Payer: Self-pay | Admitting: Family Medicine

## 2023-07-09 ENCOUNTER — Other Ambulatory Visit: Payer: Self-pay | Admitting: Family Medicine

## 2023-07-09 DIAGNOSIS — M109 Gout, unspecified: Secondary | ICD-10-CM

## 2023-07-30 ENCOUNTER — Encounter (HOSPITAL_BASED_OUTPATIENT_CLINIC_OR_DEPARTMENT_OTHER): Payer: Self-pay | Admitting: Physical Therapy

## 2023-07-30 ENCOUNTER — Ambulatory Visit (HOSPITAL_BASED_OUTPATIENT_CLINIC_OR_DEPARTMENT_OTHER): Payer: Self-pay | Admitting: Physical Therapy

## 2023-07-30 DIAGNOSIS — R293 Abnormal posture: Secondary | ICD-10-CM | POA: Diagnosis not present

## 2023-07-30 DIAGNOSIS — M62838 Other muscle spasm: Secondary | ICD-10-CM | POA: Diagnosis not present

## 2023-07-30 DIAGNOSIS — M25572 Pain in left ankle and joints of left foot: Secondary | ICD-10-CM | POA: Diagnosis not present

## 2023-07-30 NOTE — Therapy (Signed)
 OUTPATIENT PHYSICAL THERAPY CERVICAL EVALUATION   Patient Name: Mark Caldwell MRN: [id number] DOB:October 21, 1945, 78 y.o., male Today's Date: 07/31/2023  END OF SESSION:  PT End of Session - 07/30/23 1642     Visit Number 1    Number of Visits 16    Date for PT Re-Evaluation 09/24/23    PT Start Time 1345    PT Stop Time 1428    PT Time Calculation (min) 43 min    Activity Tolerance Patient tolerated treatment well    Behavior During Therapy WFL for tasks assessed/performed             Past Medical History:  Diagnosis Date   ED (erectile dysfunction)    Gout    no problem in years   Hemorrhoids    Hypertension    elevated BP.   Left inguinal hernia    noted on CT 10/2013   OA (osteoarthritis) of knee    right; Dr. Farrel Hones in [place]. cortisone injections x2, Durolane viscosupplementation injection on R 11/21/20   Partial tear of Achilles tendon summer 2005   right healed    Premature ejaculation    Prostate cancer (HCC)    surveillance; Dr. Dahlstedt:12-30-14 prostate CA on 2 biopsies,Gleason for both 3+3=^    Right rotator cuff tear hx of   Wears glasses    for reading   Past Surgical History:  Procedure Laterality Date   CATARACT EXTRACTION Left 11/17/2021   Dr.Ney   CATARACT EXTRACTION Right 12/14/2021   Dr.Ney   PROSTATE BIOPSY  2021, 2018, 2016   pyloric stenosis surgery  72 weeks old.   RADIOACTIVE SEED IMPLANT N/A 03/16/2020   Procedure: RADIOACTIVE SEED IMPLANT/BRACHYTHERAPY IMPLANT;  Surgeon: Trent Frizzle, MD;  Location: [facility];  Service: Urology;  Laterality: N/A;  90 MINS   RETINAL DETACHMENT SURGERY  03/2014   "tacked it"   ROTATOR CUFF REPAIR Right 2012   Dr. Ana Balling   SPACE OAR INSTILLATION N/A 03/16/2020   Procedure: SPACE OAR INSTILLATION;  Surgeon: Trent Frizzle, MD;  Location: [facility];  Service: Urology;  Laterality: N/A;   TONSILLECTOMY  as child   VASECTOMY  yrs ago   Patient Active Problem List    Diagnosis Date Noted   OA (osteoarthritis) of knee 11/21/2020   IFG (impaired fasting glucose) 11/17/2020   Aortic atherosclerosis (HCC) 11/17/2020   Pure hypercholesterolemia 11/17/2020   Class 1 obesity due to excess calories with serious comorbidity and body mass index (BMI) of 32.0 to 32.9 in adult 11/17/2020   Atherosclerosis of native coronary artery of native heart without angina pectoris 07/08/2018   Cyst of pancreas 11/14/2013   Abnormal finding on CT scan 11/11/2013   Prostate cancer (HCC) 10/12/2012   ED (erectile dysfunction) 07/08/2011   Essential hypertension, benign 01/03/2011   Gout 01/03/2011    PCP: Roosvelt Colla MD  REFERRING PROVIDER: Carletta Cheeks PA-C   REFERRING DIAG:  Diagnosis  M40.202 (ICD-10-CM) - Unspecified kyphosis, cervical region    THERAPY DIAG:  Abnormal posture  Other muscle spasm  Rationale for Evaluation and Treatment: Rehabilitation  ONSET DATE:   SUBJECTIVE:  SUBJECTIVE STATEMENT: Several months ago the patient was driving from [place]  to [place] .  He had an acute onset of significant neck pain.  At the time he had difficulty extending his head.  The MD put him in a neck brace for short period of time.  He had difficulty tolerating the neck brace.  He had difficulty turning his head and moving.  Over a period of time the pain progressively improved.  He continues to have some tightness in his neck.  At this time he would like a program to prevent further exacerbation of his symptoms and improve endrange movement.  He frequently travels between [place]  and [place] .  He is a member of the gym here.  He would like a program that he can take with him and work on while he is here as well. Hand dominance: Right  PERTINENT  HISTORY:  Achilles tendinitis left lower extremity, HTN, right rotator cuff tear,  PAIN:  Are you having pain? Yes: NPRS scale: none right now  Pain location: when it hurts in the neck and traps  Pain description: aching  Aggravating factors: came on driving  Relieving factors: went away over time   PRECAUTIONS: None  RED FLAGS: None     WEIGHT BEARING RESTRICTIONS: No  FALLS:  Has patient fallen in last 6 months? No  LIVING ENVIRONMENT: N/a  OCCUPATION:  Semi retired    PLOF: Independent  PATIENT GOALS:  To have less pain   NEXT MD VISIT:  Nothing scheduled   OBJECTIVE:  Note: Objective measures were completed at Evaluation unless otherwise noted.  DIAGNOSTIC FINDINGS:    PATIENT SURVEYS:  NDI 4  COGNITION: Overall cognitive status: Within functional limits for tasks assessed  SENSATION: WFL  POSTURE: rounded shoulders and forward head Increase thoracic kyphosis  PALPATION: Tightness in upper traps and into cervical paraspinals  CERVICAL ROM:   Active ROM A/PROM (deg) eval  Flexion Within normal limits  Extension Mild limitations in endrange  Right lateral flexion   Left lateral flexion   Right rotation 70  Left rotation 65   (Blank rows = not tested)  UPPER EXTREMITY ROM:  Active ROM Right eval Left eval  Shoulder flexion WNL WNL  Shoulder extension    Shoulder abduction    Shoulder adduction    Shoulder extension    Shoulder internal rotation WNL WNL  Shoulder external rotation WNL WNL  Elbow flexion    Elbow extension    Wrist flexion    Wrist extension    Wrist ulnar deviation    Wrist radial deviation    Wrist pronation    Wrist supination     (Blank rows = not tested)  UPPER EXTREMITY MMT:  MMT Right eval Left eval  Shoulder flexion 4+ 4+  Shoulder extension    Shoulder abduction    Shoulder adduction    Shoulder extension    Shoulder internal rotation 4+ 4+  Shoulder external rotation 4+ 4+  Middle trapezius     Lower trapezius    Elbow flexion    Elbow extension    Wrist flexion    Wrist extension    Wrist ulnar deviation    Wrist radial deviation    Wrist pronation    Wrist supination    Grip strength     (Blank rows = not tested)   TREATMENT DATE:   Manual: Skilled palpation of trigger points Review of self trigger point release using Thera cane  TherEX: Cable row 2 x  10 10 pounds Cable extensions 2 x 10 10 pounds                                                                                                                               Reviewed how to progress using RPE  Neuro reeducation Seated seated: Bilateral ER red 2 x 10 Horizontal abduction red 2 x 10 Shoulder flexion red 2 x 10  Patient given green band and blue band for progression at home Reviewed how to progress using RPE Reviewed power proper posture and breathing with all exercises. Reviewed exercises to avoid in the gym  PATIENT EDUCATION:  Education details: HEP, symptom management, importance of maintaining cervical rotation, use of RPE to grade exercises for home. Person educated: Patient Education method: Explanation, Demonstration, Tactile cues, Verbal cues, and Handouts Education comprehension: verbalized understanding, returned demonstration, verbal cues required, tactile cues required, and needs further education  HOME EXERCISE PROGRAM: Access Code: [email or web address] URL: [email or web address] Date: 07/30/2023 Prepared by: Signa Drier  Program Notes calf stretch massage across the achillies   Exercises - Theracane Over Shoulder  - 1 x daily - 7 x weekly - 3 sets - 10 reps - Seated Cervical Rotation AROM  - 1 x daily - 7 x weekly - 3 sets - 10 reps - Supine Shoulder Flexion Extension AAROM with Dowel  - 1 x daily - 7 x weekly - 3 sets - 10 reps - Thoracic Extension Mobilization on Foam Roll  - 1 x daily - 7 x weekly - 3 sets - 10 reps - Shoulder External Rotation and Scapular  Retraction with Resistance  - 1 x daily - 7 x weekly - 3 sets - 10 reps - Low Horizontal Abduction with Resistance  - 1 x daily - 7 x weekly - 3 sets - 10 reps - Seated Shoulder Flexion with Dowel to 90  - 1 x daily - 7 x weekly - 3 sets - 10 reps - Scapular Retraction with Resistance  - 1 x daily - 7 x weekly - 3 sets - 10 reps - Shoulder extension with resistance - Neutral  - 1 x daily - 7 x weekly - 3 sets - 10 reps  ASSESSMENT:  CLINICAL IMPRESSION: Patient is a 78 year old male with past history of cervical pain and significant cervical tightness.  He is here today for an HEP to prevent further exacerbation and work on postural alignment.  He is given a base program in case his symptoms recur.  We also reviewed how to progress his gym exercises.  He was advised to focus on posterior chain strengthening.  We also reviewed self soft tissue mobilization using Thera cane.  The patient will work on his exercises for the next few weeks.  He will follow-up as needed when he returns to [place] . OBJECTIVE IMPAIRMENTS: decreased ROM, increased muscle spasms, and pain.   ACTIVITY LIMITATIONS: lifting and reach over head  PARTICIPATION LIMITATIONS: When pain is  significant he has difficulty turning his head for any ADLs PERSONAL FACTORS: 1 comorbidity: Rotator cuff surgery are also affecting patient's functional outcome.   REHAB POTENTIAL: Excellent  CLINICAL DECISION MAKING: Stable/uncomplicated  EVALUATION COMPLEXITY: Low   GOALS: Goals reviewed with patient? No  SHORT TERM GOALS:    LTG=STG LONG TERM GOALS: Target date: 09/25/2023    Patient will be independent with self soft tissue mobilization Baseline:  Goal status: INITIAL  2.  Patient will be have complete HEP  Baseline:  Goal status: INITIAL  3. Patient will demonstrate > 70 degrees of cervical rotation in order to increase community safety and safety driving  Baseline:  Goal status: INITIAL    PLAN:  PT  FREQUENCY: 1x/week  PT DURATION: 8 weeks  PLANNED INTERVENTIONS: 97110-Therapeutic exercises, 97530- Therapeutic activity, [id number]- Neuromuscular re-education, 97535- Self Care, 16109- Manual therapy,  [id number]- Aquatic Therapy, 97014- Electrical stimulation (unattended), 97035- Ultrasound, Patient/Family education, Taping, Dry Needling, DME instructions, Cryotherapy, and Moist heat   PLAN FOR NEXT SESSION:  If patient returns assess if he is in pain or not. If he is still doing well progress strengthening. If he is in pain consider manual therapy.    Kitty Perkins, PT 07/31/2023, 7:42 PM

## 2023-07-31 ENCOUNTER — Other Ambulatory Visit: Payer: Self-pay

## 2023-07-31 ENCOUNTER — Encounter (HOSPITAL_BASED_OUTPATIENT_CLINIC_OR_DEPARTMENT_OTHER): Payer: Self-pay | Admitting: Physical Therapy

## 2023-08-20 ENCOUNTER — Telehealth: Payer: Self-pay | Admitting: *Deleted

## 2023-08-20 NOTE — Telephone Encounter (Signed)
 Copied from CRM [id number]. Topic: Appointments - Appointment Scheduling >> Aug 20, 2023  9:47 AM Myrick T wrote: Patient/patient representative is calling to schedule an appointment. Refer to attachments for appointment information. Patient request a callback from [place] about his appt  Called patient.

## 2023-08-22 ENCOUNTER — Other Ambulatory Visit: Payer: Self-pay | Admitting: Family Medicine

## 2023-08-22 DIAGNOSIS — M7662 Achilles tendinitis, left leg: Secondary | ICD-10-CM | POA: Diagnosis not present

## 2023-08-22 DIAGNOSIS — E7800 Pure hypercholesterolemia, unspecified: Secondary | ICD-10-CM

## 2023-08-22 DIAGNOSIS — I25119 Atherosclerotic heart disease of native coronary artery with unspecified angina pectoris: Secondary | ICD-10-CM

## 2023-08-22 DIAGNOSIS — I10 Essential (primary) hypertension: Secondary | ICD-10-CM

## 2023-08-22 DIAGNOSIS — M25421 Effusion, right elbow: Secondary | ICD-10-CM | POA: Diagnosis not present

## 2023-08-26 DIAGNOSIS — M7021 Olecranon bursitis, right elbow: Secondary | ICD-10-CM | POA: Diagnosis not present

## 2023-08-28 ENCOUNTER — Ambulatory Visit: Admitting: Family Medicine

## 2023-09-18 ENCOUNTER — Other Ambulatory Visit: Payer: Self-pay | Admitting: Family Medicine

## 2023-09-18 DIAGNOSIS — M109 Gout, unspecified: Secondary | ICD-10-CM

## 2023-10-13 DIAGNOSIS — M1711 Unilateral primary osteoarthritis, right knee: Secondary | ICD-10-CM | POA: Diagnosis not present

## 2023-10-13 DIAGNOSIS — M25562 Pain in left knee: Secondary | ICD-10-CM | POA: Diagnosis not present

## 2023-10-13 DIAGNOSIS — M1712 Unilateral primary osteoarthritis, left knee: Secondary | ICD-10-CM | POA: Diagnosis not present

## 2023-10-13 DIAGNOSIS — M25561 Pain in right knee: Secondary | ICD-10-CM | POA: Diagnosis not present

## 2023-10-14 DIAGNOSIS — M7662 Achilles tendinitis, left leg: Secondary | ICD-10-CM | POA: Diagnosis not present

## 2023-10-14 DIAGNOSIS — M25421 Effusion, right elbow: Secondary | ICD-10-CM | POA: Diagnosis not present

## 2023-10-14 DIAGNOSIS — M47812 Spondylosis without myelopathy or radiculopathy, cervical region: Secondary | ICD-10-CM | POA: Diagnosis not present

## 2023-11-24 DIAGNOSIS — M7662 Achilles tendinitis, left leg: Secondary | ICD-10-CM | POA: Diagnosis not present

## 2023-11-24 DIAGNOSIS — Z23 Encounter for immunization: Secondary | ICD-10-CM | POA: Diagnosis not present

## 2023-11-24 DIAGNOSIS — M25421 Effusion, right elbow: Secondary | ICD-10-CM | POA: Diagnosis not present

## 2023-11-27 DIAGNOSIS — U071 COVID-19: Secondary | ICD-10-CM | POA: Diagnosis not present

## 2023-11-27 DIAGNOSIS — I10 Essential (primary) hypertension: Secondary | ICD-10-CM | POA: Diagnosis not present

## 2023-11-27 DIAGNOSIS — J069 Acute upper respiratory infection, unspecified: Secondary | ICD-10-CM | POA: Diagnosis not present

## 2023-11-27 DIAGNOSIS — Z8546 Personal history of malignant neoplasm of prostate: Secondary | ICD-10-CM | POA: Diagnosis not present

## 2023-12-16 DIAGNOSIS — M6788 Other specified disorders of synovium and tendon, other site: Secondary | ICD-10-CM | POA: Diagnosis not present

## 2023-12-16 DIAGNOSIS — G8929 Other chronic pain: Secondary | ICD-10-CM | POA: Diagnosis not present

## 2023-12-16 DIAGNOSIS — M47812 Spondylosis without myelopathy or radiculopathy, cervical region: Secondary | ICD-10-CM | POA: Diagnosis not present

## 2023-12-16 DIAGNOSIS — M79672 Pain in left foot: Secondary | ICD-10-CM | POA: Diagnosis not present

## 2023-12-18 DIAGNOSIS — M47812 Spondylosis without myelopathy or radiculopathy, cervical region: Secondary | ICD-10-CM | POA: Diagnosis not present

## 2023-12-23 DIAGNOSIS — G8929 Other chronic pain: Secondary | ICD-10-CM | POA: Diagnosis not present

## 2023-12-24 DIAGNOSIS — R972 Elevated prostate specific antigen [PSA]: Secondary | ICD-10-CM | POA: Diagnosis not present

## 2023-12-24 DIAGNOSIS — N5201 Erectile dysfunction due to arterial insufficiency: Secondary | ICD-10-CM | POA: Diagnosis not present

## 2023-12-24 DIAGNOSIS — C61 Malignant neoplasm of prostate: Secondary | ICD-10-CM | POA: Diagnosis not present

## 2023-12-24 LAB — PSA: PSA: 0.17

## 2023-12-30 DIAGNOSIS — M79672 Pain in left foot: Secondary | ICD-10-CM | POA: Diagnosis not present

## 2023-12-30 DIAGNOSIS — G8929 Other chronic pain: Secondary | ICD-10-CM | POA: Diagnosis not present

## 2024-01-05 DIAGNOSIS — M79672 Pain in left foot: Secondary | ICD-10-CM | POA: Diagnosis not present

## 2024-01-05 DIAGNOSIS — G8929 Other chronic pain: Secondary | ICD-10-CM | POA: Diagnosis not present

## 2024-01-26 DIAGNOSIS — K2900 Acute gastritis without bleeding: Secondary | ICD-10-CM | POA: Diagnosis not present

## 2024-01-26 DIAGNOSIS — Z8546 Personal history of malignant neoplasm of prostate: Secondary | ICD-10-CM | POA: Diagnosis not present

## 2024-01-26 DIAGNOSIS — Z87891 Personal history of nicotine dependence: Secondary | ICD-10-CM | POA: Diagnosis not present

## 2024-01-26 DIAGNOSIS — I10 Essential (primary) hypertension: Secondary | ICD-10-CM | POA: Diagnosis not present

## 2024-02-16 DIAGNOSIS — M170 Bilateral primary osteoarthritis of knee: Secondary | ICD-10-CM | POA: Diagnosis not present

## 2024-02-17 ENCOUNTER — Encounter: Payer: Self-pay | Admitting: *Deleted

## 2024-02-17 DIAGNOSIS — M170 Bilateral primary osteoarthritis of knee: Secondary | ICD-10-CM | POA: Diagnosis not present

## 2024-02-17 NOTE — Progress Notes (Unsigned)
 Chief Complaint  Patient presents with   Consult    Has been off Wegovy  off 2 months, has 2.4 at the house and would like to know how to restart. Brought in PSA from [place]. 0.17 on 12/24/23. He would like the A!C.    Patient presents for 6 month f/u on chronic problems. He sees many other doctors, some here and some in [place].  OA in knees:  Sees Dr. Dalldorf locally, and also Dr. Laurence in [place]. He is s/p cortisone shots and viscosupplementation.  He last saw Dr. Dalldorf 12/22--he will be retiring, so referred him to Dr. Sherida to discuss knee replacement. He is hoping to get Dr. Dalldorf or Dr. Liam to do it.  He doesn't have much pain, just wobbles when he walks, hard when traveling. He is taking Tylenol  Arthritis regularly, 2 pills twice daily. He no longer uses the Voltaren gel. Has been prescribed diclofenac in [place], takes twice daily (occasionally just once in the morning and not the evening dose) since October. He can't tell much of a difference.  He was seen in the UC in [place] on 01/26/24, diagnosed with acute gastritis and treated with protonix 20mg  and zofran .  He had been off Wegovy  for a month and had just restarted it the week prior, developed n/v and epigastric pain. He had declined ER evaluation, no imaging or labs done. Symptoms resolved. He would like to restart Wegovy  but is hesitant. He has 2.4 mg pens at home still.  He has had recurrent issues with swelling of the R elbow. He had it drained in July, August, and September. Fluid was sent off in August, no infection noted. TAC cream and compression helped.  He reports the right elbow swelling has resolved.  L achilles tendonitis--he has been having pain since June. He now mostly notes swelling, but the pain improved.  Stepping in gopher holes a month ago caused severe pain, but pain resolved shortly after. He now has minimal pain, but still has some swelling. He underwent PT in October/November, which helped some.  He has some weakness on the  left.  Has HEP, hasn't done in the last week or two.  Obesity:  He was originally on Ozempic 2 mg through Dr. Candie in [place], had switched to Wegovy  2.4 mg, was covered by his insurance.  It was helping him with appetite and portion sizes.  He had some mild constipation. At one point he got down to 216#. After he got sick earlier this month, he weight went down to 209# but again stabilized at 216#. He had some recent gain related to holidays, gatherings (and not on Wegovy  currently). Naltrexone was added in November, but it made him nauseated and dizzy.  As reported above, he had stopped it for 6 weeks before restarting it and getting sick. Hasn't taken any since then, but would like to restart. He still has 2.4 mg pens at home, no lower doses.   Wt Readings from Last 3 Encounters:  02/18/24 220 lb 12.8 oz (100.2 kg)  07/02/23 222 lb 6.4 oz (100.9 kg)  07/01/22 232 lb 6.4 oz (105.4 kg)     Hypertension:  He reports compliance with taking lisinopril  40mg  daily. BP's at home are 120's/80 at home. He denies any headache, dizziness, cough, chest pain, shortness of breath, edema.  BP Readings from Last 3 Encounters:  02/18/24 120/80  07/02/23 132/80  07/01/22 110/60     Atherosclerosis of aorta and coronary arteries noted on CT in  January 2020. He is taking Crestor  10mg  daily. He had low risk stress test with Dr. Jeffrie in 06/2018.  He denies any chest pain, palpitations, DOE. Tries to follow lowfat, low cholesterol diet.  Denies side effects of statin.  Lipids were good on last check in May: Lab Results  Component Value Date   CHOL 151 07/02/2023   HDL 56 07/02/2023   LDLCALC 72 07/02/2023   TRIG 135 07/02/2023   CHOLHDL 2.7 07/02/2023    Gout: Patient is currently taking 400mg  of allopurinol .  He had tried decreasing it to 300mg  (recommended in April 2022, when his HCTZ was stopped), but had recurrent problems in the knee at the lower dose. He denies any recent gout flares.  He  remains compliant with taking allopurinol . He still is hesitant about making any changes, wants to stay at 400 mg.  Lab Results  Component Value Date   LABURIC 3.9 07/02/2023    IFG: A1c was done 12/2021 at weight loss clinic, 5.7%. Last check was normal at 5.4% at his physical in 06/2023. Trying to not have wine daily with dinner, has 4 days/week, but more with holidays and when family is in town. Exercise has been limited.  He hopes to get back to [place]. Has exercise bike at home in [place].   Prostate cancer: he had been under surveillance by Dr. Matilda for a while, but progressed (biopsy 10/2019) and underwent brachytherapy (radioactive seed implant) in 02/2020.  He had a good response to brachytherapy. He last saw Dr. Cam in 01/2023. PSA was repeated at that time (results not available); PSA was 0.2 in 09/2022 in [place] (per Dr. Hosea notes).  Note stated that plan is once yearly checks.  He requested it be rechecked at his physical in May, and remained good at 0.3 He brings in PSA from 12/25/23, 0.17.  Lab Results  Component Value Date   PSA1 0.3 07/02/2023   PSA1 0.5 07/01/2022   PSA1 7.6 (H) 05/29/2018   PSA 0.17 12/24/2023   PSA 0.22 11/07/2020   PSA 4.7 (H) 02/06/2017     Erectile dysfunction: managed by urologist, alternates sildenafil  and Trimix with good results. He used to get discounted Viagra  from [facility], gets the Trimix from urologist in [place]. Requesting generic lower dose of sildenafil  to try, isn't sure if he will f/u with the urologist in [place].   BPH: Dr. Cam suggested at 01/2023 visit that he could try stopping tamsulosin.  He stopped it and doesn't notice any change. Urine stream is strong, is up 1-2x/night.  He continues to do well of of it, just occasional urgency.   PMH, PSH, SH reviewed  Outpatient Encounter Medications as of 02/18/2024  Medication Sig Note   allopurinol  (ZYLOPRIM ) 100 MG tablet TAKE 1 TABLET BY MOUTH EVERY DAY    allopurinol   (ZYLOPRIM ) 300 MG tablet TAKE 1 TABLET BY MOUTH EVERY DAY    B Complex-C (SUPER B COMPLEX PO) Take 1 tablet by mouth daily.    Cholecalciferol (VITAMIN D ) 2000 UNITS tablet Take 2,000 Units by mouth daily.    diclofenac (VOLTAREN) 75 MG EC tablet Take 75 mg by mouth 2 (two) times daily. 02/18/2024: Taking for knees, once or twice daily since October    lisinopril  (ZESTRIL ) 40 MG tablet TAKE 1 TABLET BY MOUTH EVERY DAY    NONFORMULARY OR COMPOUNDED ITEM Inject 0.5 mg as directed as needed. 07/02/2023: Tri-Mix   rosuvastatin  (CRESTOR ) 10 MG tablet TAKE 1 TABLET BY MOUTH EVERY DAY  sildenafil  (REVATIO ) 20 MG tablet TAKE 2 TO 5 TABLETS BY MOUTH DAILY AS NEEDED 07/01/2022: Prn,alternating with tri-mix   WEGOVY  2.4 MG/0.75ML SOAJ Inject 2.4 mg into the skin once a week. (Patient not taking: Reported on 02/18/2024) 07/02/2023: Takes on Sat   No facility-administered encounter medications on file as of 02/18/2024.   No Known Allergies  ROS: Denies fever, chills, URI symptoms, headaches, dizziness, shortness of breath, chest pain.  Denies nausea, vomiting (had earlier this month per HPI, resolved). Denies bowel changes, urinary complaints, bleeding, bruising, rash.  Moods are good.  No gout flares. Knee pain per HPI.  R elbow no longer swells or hurts L heel no longer hurts, but has some swelling Denies neck pain +ED is controlled, per HPI.   Weight per HPI.    PHYSICAL EXAM:  BP 120/80   Pulse 60   Ht 5' 11 (1.803 m)   Wt 220 lb 12.8 oz (100.2 kg)   BMI 30.80 kg/m   Wt Readings from Last 3 Encounters:  02/18/24 220 lb 12.8 oz (100.2 kg)  07/02/23 222 lb 6.4 oz (100.9 kg)  07/01/22 232 lb 6.4 oz (105.4 kg)   Pleasant male in no distress HEENT: conjunctiva and sclera are clear, EOMI, OP clear Neck: no lymphadenopathy, thyromegaly or mass, no bruit Heart: regular rate and rhythm Lungs: clear bilaterally Back: no spinal or CVA tenderness Abdomen: soft, nontender, no masses. Central  obesity Extremities: no edema Psych: normal mood, affect, hygiene and grooming Neuro: alert and oriented, normal strength, gait   Lab Results  Component Value Date   HGBA1C 5.3 02/18/2024     ASSESSMENT/PLAN:  Essential hypertension, benign - well controlled. Cont meds, low Na diet. Daily exercise and weight loss rec  Medication monitoring encounter - Plan: Comprehensive metabolic panel with GFR  IFG (impaired fasting glucose) - Reviewed proper diet, exercise. Further weight loss encouraged - Plan: HgB A1c  Pure hypercholesterolemia - cont statin, low cholesterol diet  Atherosclerosis of native coronary artery of native heart with angina pectoris - seen on CT; asymptomatic, neg stress test. Cont statin  Class 1 obesity due to excess calories with serious comorbidity and body mass index (BMI) of 30.0 to 30.9 in adult - Restart Wegovy  at 1mg , to contact us  if desires 1.7mg  dose vs re-try the 2.4 he has at home. - Plan: semaglutide -weight management (WEGOVY ) 1 MG/0.5ML SOAJ SQ injection  Erectile dysfunction, unspecified erectile dysfunction type - cont Trimix. Rx'd 20mg  sildenafil  to use up to 100mg  at a time - Plan: sildenafil  (REVATIO ) 20 MG tablet  Primary osteoarthritis of both knees - stop diclofenac orally, use voltaren gel, Tylenol  Arthritis. Encouraged swimming, exercise bike, weight loss   Imms UTD Checking c-met today given regular use of diclofenac from another provider. Since he isn't getting that much benefit, discussed him switching back to voltaren gel. Cont tylenol  arthritis regularly. Reviewed exercises easier on the knees (swimming, exercise bike)   F/u as scheduled for CPE/AWV in May 2026

## 2024-02-18 ENCOUNTER — Encounter: Payer: Self-pay | Admitting: Family Medicine

## 2024-02-18 ENCOUNTER — Ambulatory Visit (INDEPENDENT_AMBULATORY_CARE_PROVIDER_SITE_OTHER): Admitting: Family Medicine

## 2024-02-18 ENCOUNTER — Other Ambulatory Visit: Payer: Self-pay | Admitting: Family Medicine

## 2024-02-18 ENCOUNTER — Encounter: Payer: Self-pay | Admitting: *Deleted

## 2024-02-18 VITALS — BP 120/80 | HR 60 | Ht 71.0 in | Wt 220.8 lb

## 2024-02-18 DIAGNOSIS — Z6830 Body mass index (BMI) 30.0-30.9, adult: Secondary | ICD-10-CM | POA: Diagnosis not present

## 2024-02-18 DIAGNOSIS — E7800 Pure hypercholesterolemia, unspecified: Secondary | ICD-10-CM

## 2024-02-18 DIAGNOSIS — N529 Male erectile dysfunction, unspecified: Secondary | ICD-10-CM | POA: Diagnosis not present

## 2024-02-18 DIAGNOSIS — Z5181 Encounter for therapeutic drug level monitoring: Secondary | ICD-10-CM

## 2024-02-18 DIAGNOSIS — I25119 Atherosclerotic heart disease of native coronary artery with unspecified angina pectoris: Secondary | ICD-10-CM | POA: Diagnosis not present

## 2024-02-18 DIAGNOSIS — R7301 Impaired fasting glucose: Secondary | ICD-10-CM | POA: Diagnosis not present

## 2024-02-18 DIAGNOSIS — M170 Bilateral primary osteoarthritis of knee: Secondary | ICD-10-CM | POA: Diagnosis not present

## 2024-02-18 DIAGNOSIS — I10 Essential (primary) hypertension: Secondary | ICD-10-CM

## 2024-02-18 DIAGNOSIS — E66811 Obesity, class 1: Secondary | ICD-10-CM

## 2024-02-18 DIAGNOSIS — E6609 Other obesity due to excess calories: Secondary | ICD-10-CM

## 2024-02-18 LAB — COMPREHENSIVE METABOLIC PANEL WITH GFR
ALT: 19 IU/L (ref 0–44)
AST: 15 IU/L (ref 0–40)
Albumin: 4.6 g/dL (ref 3.8–4.8)
Alkaline Phosphatase: 78 IU/L (ref 47–123)
BUN/Creatinine Ratio: 28 — ABNORMAL HIGH (ref 10–24)
BUN: 23 mg/dL (ref 8–27)
Bilirubin Total: 0.5 mg/dL (ref 0.0–1.2)
CO2: 22 mmol/L (ref 20–29)
Calcium: 9.9 mg/dL (ref 8.6–10.2)
Chloride: 106 mmol/L (ref 96–106)
Creatinine, Ser: 0.83 mg/dL (ref 0.76–1.27)
Globulin, Total: 2 g/dL (ref 1.5–4.5)
Glucose: 105 mg/dL — ABNORMAL HIGH (ref 70–99)
Potassium: 4.5 mmol/L (ref 3.5–5.2)
Sodium: 142 mmol/L (ref 134–144)
Total Protein: 6.6 g/dL (ref 6.0–8.5)
eGFR: 91 mL/min/1.73

## 2024-02-18 LAB — POCT GLYCOSYLATED HEMOGLOBIN (HGB A1C): Hemoglobin A1C: 5.3 % (ref 4.0–5.6)

## 2024-02-18 MED ORDER — SILDENAFIL CITRATE 20 MG PO TABS: ORAL_TABLET | ORAL | 1 refills | Status: AC

## 2024-02-18 MED ORDER — WEGOVY 1 MG/0.5ML ~~LOC~~ SOAJ: 1.0000 mg | SUBCUTANEOUS | 0 refills | Status: AC

## 2024-02-18 NOTE — Patient Instructions (Addendum)
 I encourage swimming and exercise bike. Remember to do some strengthening exercises as well (weights/resistance).  Take anywhere from 2-5 of the sildenafil  (5 is the equivalent of 100 mg Viagra ) as needed for erectile dysfunction.  Check around for quantities and pricing.  If the printed prescription isn't the correct quantity, let me know the amount and location and I can send it electronically.  Stop taking the diclofenac.  Go back to the Voltaren gel. This is safer for your stomach and kidneys.

## 2024-02-20 ENCOUNTER — Ambulatory Visit: Payer: Self-pay | Admitting: Family Medicine

## 2024-02-20 ENCOUNTER — Encounter: Payer: Self-pay | Admitting: Family Medicine

## 2024-03-02 ENCOUNTER — Telehealth: Payer: Self-pay | Admitting: *Deleted

## 2024-03-02 NOTE — Telephone Encounter (Signed)
 Copied from CRM [id number]. Topic: General - Other >> Mar 02, 2024 12:23 PM Aleatha C wrote: Reason for CRM: Patient would like to leave a message for [place] Phillippi Dr. Ossie will be doing his  knee replacement in February Dr Randol should be receiving a letter about the surgery and she need to response before it happens   FYI.

## 2024-03-03 NOTE — Telephone Encounter (Signed)
 Form completed--needs CBC and EKG. Had A1c and c-met 12/24, and these are good for 3 months. Please advise pt.

## 2024-03-03 NOTE — Telephone Encounter (Signed)
 Left message for patient to call and nurse visit to have CBC and EKG done.

## 2024-03-04 ENCOUNTER — Other Ambulatory Visit: Payer: Self-pay | Admitting: *Deleted

## 2024-03-04 DIAGNOSIS — Z01818 Encounter for other preprocedural examination: Secondary | ICD-10-CM

## 2024-03-05 ENCOUNTER — Other Ambulatory Visit

## 2024-03-05 DIAGNOSIS — Z01818 Encounter for other preprocedural examination: Secondary | ICD-10-CM

## 2024-03-05 LAB — CBC WITH DIFFERENTIAL/PLATELET
Basophils Absolute: 0.1 x10E3/uL (ref 0.0–0.2)
Basos: 1 %
EOS (ABSOLUTE): 0.2 x10E3/uL (ref 0.0–0.4)
Eos: 3 %
Hematocrit: 46.8 % (ref 37.5–51.0)
Hemoglobin: 15.2 g/dL (ref 13.0–17.7)
Immature Grans (Abs): 0 x10E3/uL (ref 0.0–0.1)
Immature Granulocytes: 0 %
Lymphocytes Absolute: 1.6 x10E3/uL (ref 0.7–3.1)
Lymphs: 20 %
MCH: 31.7 pg (ref 26.6–33.0)
MCHC: 32.5 g/dL (ref 31.5–35.7)
MCV: 98 fL — ABNORMAL HIGH (ref 79–97)
Monocytes Absolute: 0.9 x10E3/uL (ref 0.1–0.9)
Monocytes: 11 %
Neutrophils Absolute: 5.3 x10E3/uL (ref 1.4–7.0)
Neutrophils: 64 %
Platelets: 304 x10E3/uL (ref 150–450)
RBC: 4.8 x10E6/uL (ref 4.14–5.80)
RDW: 13.2 % (ref 11.6–15.4)
WBC: 8.1 x10E3/uL (ref 3.4–10.8)

## 2024-03-08 ENCOUNTER — Ambulatory Visit: Payer: Self-pay | Admitting: Family Medicine

## 2024-03-12 ENCOUNTER — Telehealth: Payer: Self-pay | Admitting: Family Medicine

## 2024-03-12 NOTE — Telephone Encounter (Signed)
 Copied from CRM [id number]. Topic: General - Other >> Mar 12, 2024 12:11 PM Thersia BROCKS wrote: Reason for CRM: Patient called in regarding a message from Veronica, wanted to know who can he go to for  a hearing test and if that requires a referral, would like a callback

## 2024-03-15 NOTE — Telephone Encounter (Signed)
 Patient advised

## 2024-03-25 ENCOUNTER — Other Ambulatory Visit: Payer: Self-pay | Admitting: *Deleted

## 2024-03-25 ENCOUNTER — Telehealth: Payer: Self-pay | Admitting: *Deleted

## 2024-03-25 MED ORDER — SEMAGLUTIDE-WEIGHT MANAGEMENT 1.7 MG/0.75ML ~~LOC~~ SOAJ: 1.7000 mg | SUBCUTANEOUS | 0 refills | Status: AC

## 2024-03-25 NOTE — Telephone Encounter (Signed)
 Copied from CRM #[id number]. Topic: Clinical - Medication Refill >> Mar 25, 2024  9:50 AM Alfonso HERO wrote: Medication: semaglutide -weight management (WEGOVY ) 1.7 MG/0.5ML SOAJ SQ injection  (Patient said he tolerated the 1MG  and now read for 1.7)  Has the patient contacted their pharmacy? No (Agent: If no, request that the patient contact the pharmacy for the refill. If patient does not wish to contact the pharmacy document the reason why and proceed with request.) (Agent: If yes, when and what did the pharmacy advise?)  This is the patient's preferred pharmacy:   [id number] [place], [place] - [address] [place] [address] Phone: [phone] Fax: [phone]   Is this the correct pharmacy for this prescription? Yes If no, delete pharmacy and type the correct one.   Has the prescription been filled recently? Yes  Is the patient out of the medication? Yes  Has the patient been seen for an appointment in the last year OR does the patient have an upcoming appointment? Yes  Can we respond through MyChart? Yes  Agent: Please be advised that Rx refills may take up to 3 business days. We ask that you follow-up with your pharmacy.    RX sent.

## 2024-05-10 ENCOUNTER — Ambulatory Visit (HOSPITAL_BASED_OUTPATIENT_CLINIC_OR_DEPARTMENT_OTHER): Payer: Self-pay | Admitting: Physical Therapy

## 2024-05-13 ENCOUNTER — Encounter (HOSPITAL_BASED_OUTPATIENT_CLINIC_OR_DEPARTMENT_OTHER): Payer: Self-pay

## 2024-05-17 ENCOUNTER — Encounter (HOSPITAL_BASED_OUTPATIENT_CLINIC_OR_DEPARTMENT_OTHER): Payer: Self-pay | Admitting: Physical Therapy

## 2024-05-20 ENCOUNTER — Encounter (HOSPITAL_BASED_OUTPATIENT_CLINIC_OR_DEPARTMENT_OTHER): Payer: Self-pay

## 2024-05-24 ENCOUNTER — Encounter (HOSPITAL_BASED_OUTPATIENT_CLINIC_OR_DEPARTMENT_OTHER): Payer: Self-pay | Admitting: Physical Therapy

## 2024-05-27 ENCOUNTER — Encounter (HOSPITAL_BASED_OUTPATIENT_CLINIC_OR_DEPARTMENT_OTHER): Payer: Self-pay | Admitting: Physical Therapy

## 2024-05-31 ENCOUNTER — Encounter (HOSPITAL_BASED_OUTPATIENT_CLINIC_OR_DEPARTMENT_OTHER): Payer: Self-pay | Admitting: Physical Therapy

## 2024-06-03 ENCOUNTER — Encounter (HOSPITAL_BASED_OUTPATIENT_CLINIC_OR_DEPARTMENT_OTHER): Payer: Self-pay | Admitting: Physical Therapy

## 2024-06-07 ENCOUNTER — Encounter (HOSPITAL_BASED_OUTPATIENT_CLINIC_OR_DEPARTMENT_OTHER): Payer: Self-pay | Admitting: Physical Therapy

## 2024-06-10 ENCOUNTER — Encounter (HOSPITAL_BASED_OUTPATIENT_CLINIC_OR_DEPARTMENT_OTHER): Payer: Self-pay | Admitting: Physical Therapy

## 2024-06-14 ENCOUNTER — Encounter (HOSPITAL_BASED_OUTPATIENT_CLINIC_OR_DEPARTMENT_OTHER): Payer: Self-pay | Admitting: Physical Therapy

## 2024-06-17 ENCOUNTER — Encounter (HOSPITAL_BASED_OUTPATIENT_CLINIC_OR_DEPARTMENT_OTHER): Payer: Self-pay | Admitting: Physical Therapy

## 2024-07-07 ENCOUNTER — Ambulatory Visit: Payer: Self-pay | Admitting: Family Medicine
